# Patient Record
Sex: Male | Born: 1940 | Race: White | Hispanic: No | Marital: Married | Smoking: Former smoker
Health system: Southern US, Community
[De-identification: ages and names within clinical notes are randomized; demographics above are authoritative.]

## PROBLEM LIST (undated history)

## (undated) DIAGNOSIS — F32A Depression, unspecified: Secondary | ICD-10-CM

## (undated) DIAGNOSIS — F329 Major depressive disorder, single episode, unspecified: Secondary | ICD-10-CM

## (undated) DIAGNOSIS — K56609 Unspecified intestinal obstruction, unspecified as to partial versus complete obstruction: Secondary | ICD-10-CM

## (undated) DIAGNOSIS — I495 Sick sinus syndrome: Secondary | ICD-10-CM

## (undated) DIAGNOSIS — C801 Malignant (primary) neoplasm, unspecified: Secondary | ICD-10-CM

## (undated) DIAGNOSIS — M1990 Unspecified osteoarthritis, unspecified site: Secondary | ICD-10-CM

## (undated) HISTORY — PX: TONSILLECTOMY: SUR1361

## (undated) HISTORY — PX: BASAL CELL CARCINOMA EXCISION: SHX1214

## (undated) HISTORY — PX: COLON SURGERY: SHX602

## (undated) HISTORY — DX: Sick sinus syndrome: I49.5

---

## 1998-02-06 ENCOUNTER — Ambulatory Visit (HOSPITAL_BASED_OUTPATIENT_CLINIC_OR_DEPARTMENT_OTHER): Admission: RE | Admit: 1998-02-06 | Discharge: 1998-02-06 | Payer: Self-pay | Admitting: General Surgery

## 2003-08-21 ENCOUNTER — Ambulatory Visit (HOSPITAL_COMMUNITY): Admission: RE | Admit: 2003-08-21 | Discharge: 2003-08-21 | Payer: Self-pay | Admitting: Gastroenterology

## 2003-08-21 ENCOUNTER — Encounter (INDEPENDENT_AMBULATORY_CARE_PROVIDER_SITE_OTHER): Payer: Self-pay | Admitting: Specialist

## 2003-09-12 HISTORY — PX: COLECTOMY: SHX59

## 2003-09-12 HISTORY — PX: APPENDECTOMY: SHX54

## 2003-09-29 ENCOUNTER — Encounter (INDEPENDENT_AMBULATORY_CARE_PROVIDER_SITE_OTHER): Payer: Self-pay | Admitting: Specialist

## 2003-09-29 ENCOUNTER — Inpatient Hospital Stay (HOSPITAL_COMMUNITY): Admission: RE | Admit: 2003-09-29 | Discharge: 2003-10-04 | Payer: Self-pay | Admitting: General Surgery

## 2004-09-07 ENCOUNTER — Ambulatory Visit (HOSPITAL_COMMUNITY): Admission: RE | Admit: 2004-09-07 | Discharge: 2004-09-07 | Payer: Self-pay | Admitting: Gastroenterology

## 2004-09-07 ENCOUNTER — Encounter (INDEPENDENT_AMBULATORY_CARE_PROVIDER_SITE_OTHER): Payer: Self-pay | Admitting: *Deleted

## 2005-01-11 HISTORY — PX: TOTAL KNEE ARTHROPLASTY: SHX125

## 2005-01-31 ENCOUNTER — Inpatient Hospital Stay (HOSPITAL_COMMUNITY): Admission: RE | Admit: 2005-01-31 | Discharge: 2005-02-03 | Payer: Self-pay | Admitting: Orthopedic Surgery

## 2007-10-19 ENCOUNTER — Inpatient Hospital Stay (HOSPITAL_COMMUNITY): Admission: EM | Admit: 2007-10-19 | Discharge: 2007-10-23 | Payer: Self-pay | Admitting: Emergency Medicine

## 2010-10-26 NOTE — H&P (Signed)
[name], [name] NO.:  [id number]   MEDICAL RECORD NO.:  [id number]          PATIENT TYPE:  EMS   LOCATION:  ED                           FACILITY:  [facility]   PHYSICIAN:  George [name], M.D.DATE OF BIRTH:  07-05-1940   DATE OF ADMISSION:  10/19/2007  DATE OF DISCHARGE:                              HISTORY & PHYSICAL   PRIMARY CARE PHYSICIAN:  George [name], M.D.   CHIEF COMPLAINT:  Abdominal pain.   HISTORY OF PRESENT ILLNESS:  The patient is a 70 year old white male  with a past history of hemicolectomy status post colon cancer as well as  BPH, who woke up this morning with severe abdominal pain.  He came into  the emergency room after having several ever episodes of nausea and  vomiting.  By an abdominal series he was found to have signs suspicious  for a partial small-bowel obstruction.  Labs were done.  He was found to  have a white count 12.5 and a mildly elevated BUN and creatinine.  The  patient was started on IV fluids, an NG tube was placed and 500 mL of  fluid was done from suction.  The patient was felt to have a minor small-  bowel obstruction and needed to come in for further evaluation.   Currently he is doing well.  He complains of some mild nausea, some  abdominal pain and discomfort from the NG tube.  Denies any headaches,  vision changes, dysphagia, no chest pain, palpitations, shortness of  breath, wheeze, cough, hematuria, dysuria, constipation, diarrhea, focal  extremity numbness, weakness or pain.  Review of systems otherwise  negative.   PAST MEDICAL HISTORY:  1. BPH.  2. Status post hemicolectomy from colon cancer.   MEDICATIONS:  He is on Celebrex, Cardura and Flomax.   ALLERGIES:  No known drug allergies.   SOCIAL HISTORY:  No tobacco or drug use.  He takes about two beers a  day, no more than this.   FAMILY HISTORY:  Noncontributory.   PHYSICAL EXAM:  VITAL SIGNS ON ADMISSION:  Temperature 97.6, heart rate  78,  blood pressure 132/75, respirations 20, O2 saturation 97% on room  air.  GENERAL:  He is alert and oriented x3, in no apparent distress.  HEENT:  He is normocephalic atraumatic.  His mucous membranes are dry.  He has no carotid bruits.  HEART:  Regular rate and rhythm, S1, S2.  LUNGS:  Clear to auscultation bilaterally.  ABDOMEN:  Soft, distended.  Some generalized tenderness.  No bowel  sounds.  EXTREMITIES:  No clubbing, cyanosis or edema.   LAB WORK:  Abdominal x-ray shows findings suspicious for partial small-  bowel obstruction, cardiomegaly but no acute cardiopulmonary disease.  Labs:  White count 12.5, H&H 15.8 and 46, MCV of 89, platelet count 179,  89% shift.  Sodium 141, potassium 3.9, chloride 102, bicarb 29, BUN 24,  creatinine 1.01, glucose 169.  LFTs were unremarkable.  UA is still  pending.  Lipase 28.   ASSESSMENT AND PLAN:  1. Small bowel obstruction.  N.P.O., pain and nausea medications.  2. History of benign prostatic  hypertrophy.  Holding his p.o.      medications.      George [name], M.D.  Electronically Signed     SKK/MEDQ  D:  10/19/2007  T:  10/19/2007  Job:  [id number]   cc:   George [name], M.D.  Fax: [phone]

## 2010-10-26 NOTE — Discharge Summary (Signed)
NAMESHIRLEY, George [name]           ACCOUNT NO.:  [id number]   MEDICAL RECORD NO.:  [id number]          PATIENT TYPE:  INP   LOCATION:  1616                         FACILITY:  [facility]   PHYSICIAN:  Kela Millin, M.D.DATE OF BIRTH:  [birth year]   DATE OF ADMISSION:  10/19/2007  DATE OF DISCHARGE:  10/23/2007                               DISCHARGE SUMMARY   DISCHARGE DIAGNOSES:  1. Partial small bowel obstruction.  2. 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  3. Hypokalemia - potassium replaced.  4. History of benign prostatic hypertrophy.   STUDIES:  Abdominal x-ray - findings suspicious for a partial small  bowel obstruction.  Cardiomegaly but no acute cardiopulmonary disease.   CONSULTATIONS:  Surgery, Dr. Colin Benton.   BRIEF HISTORY:  The patient is a 70 year old white male with the above-  listed medical problems who presented with complaints of severe  abdominal pain along with nausea and vomiting.  In the ER, abdominal  films were done - suspicious for a partial small bowel obstruction.  He  was admitted for further evaluation and management.  Other lab work  showed a BUN of 21 with a creatinine of 0.95 and urinalysis was negative  for infection.  A lipase was done and it was within normal limits at 28.   Please see the full admission history and physical dictated on Oct 19, 2007 by Dr. Rito Ehrlich for the details of the admission physical exam as  well as the laboratory data.   HOSPITAL COURSE:  1. Partial small bowel obstruction - upon admission an NG tube was      placed for decompression and the patient was started on IV fluids      and kept n.p.o.  By the second hospital day his NG tube output was      still high and so surgery was consulted for further      recommendations.  Dr. Colin Benton saw the patient and agreed with      continuing the NG tube/conservative management.  The patient also  developed fevers while in the hospital and blood cultures were      ordered and he was empirically started on IV antibiotics.      Urinalysis was negative for infection as above.  The patient's      blood cultures have not grown any bacteria and he defervesced.  He      also began having bowel movements and a recheck abdominal x-ray      showed improving small bowel obstruction pattern.  He was then      started on clear liquids and he tolerated this well, and his diet      was advanced as tolerated to a heart-healthy diet, which he has      tolerated well.  Surgery followed up with him on rounds today and      the impression was that his partial small bowel obstruction has      resolved, no further antibiotics recommended given as the patient      is  asymptomatic and workup so far has been negative for an      infectious source.  He will be discharged home today and he is to      follow up with his primary care physician - per Dr. Freida Busman, no      followup with surgery needed.  2. Hypokalemia - his potassium was replaced.  3. A history of benign prostatic hypertrophy - he is to continue his      outpatient medications upon discharge.   DISCHARGE MEDICATIONS:  Patient to continue Cardura, Flomax and Celebrex  as previously.   FOLLOW-UP CARE:  Primary care physician/Dr. Leonides Sake in 1-2 weeks.   DISCHARGE CONDITION:  Improved/stable.      Kela Millin, M.D.  Electronically Signed     ACV/MEDQ  D:  10/23/2007  T:  10/23/2007  Job:  [id number]   cc:   Holley Bouche, M.D.  Fax: [phone]

## 2010-10-26 NOTE — Consult Note (Signed)
[name], George [name] NO.:  [id number]   MEDICAL RECORD NO.:  [id number]          George [name] TYPE:  INP   LOCATION:  1616                         FACILITY:  [facility]   PHYSICIAN:  Alfonse Ras, MD   DATE OF BIRTH:  May 13, 1941   DATE OF CONSULTATION:  10/20/2007  DATE OF DISCHARGE:                                 CONSULTATION   I was asked to see by Dr. Rito Ehrlich.   REASON FOR CONSULTATION:  Partial small bowel obstruction.   HISTORY OF PRESENT ILLNESS:  This is a 70 year old white male with a  history of right colectomy for a high-grade dysplastic polyp of the  right colon in 2005 by Dr. Zachery Dakins.  The George [name] woke yesterday with  acute onset of abdominal pain which was like cramping in nature.  He was  having some nausea and vomiting.  He was seen in the emergency room.  Acute abdominal series was consistent with partial small bowel  obstruction.  He had a white count at that time of 12.5.  He was  admitted, placed on NG tube decompression and films just took early this  morning showed no real change in bowel-gas pattern.  The George [name] has  been passing flatus since early this morning and thus feel significantly  better.   PAST MEDICAL HISTORY:  Significant for BPH and as above.   MEDICATIONS:  Include Celebrex, Cardura, and Flomax.   REVIEW OF SYSTEMS:  Significant as above.   PHYSICAL EXAMINATION:  GENERAL:  He is an age-appropriate white male in  no distress.  VITAL SIGNS:  His temperature is 98.4, blood pressure is 142/78, and  respiratory rate is 16.  HEENT EXAM:  Benign.  Normocephalic and atraumatic.  Pupils are equal,  round, and reactive to light.  LUNGS:  Clear to auscultation and percussion x2.  HEART:  Regular rate and rhythm without murmurs, rubs, or gallops.  ABDOMEN:  Distended with possible bowel sounds and some mild generalized  tenderness but no rebound.  EXTREMITIES:  Show no clubbing, cyanosis, or edema of significance.   IMPRESSION:   Partial small bowel obstruction quite early.   PLAN:  I agree with NG tube decompression and IV fluids.  I will recheck  KUB tomorrow and follow him clinically.      Alfonse Ras, MD  Electronically Signed     KRE/MEDQ  D:  10/20/2007  T:  10/21/2007  Job:  [id number]

## 2010-10-29 NOTE — Discharge Summary (Signed)
NAMESEQUOIA, George [name] NO.:  [id number]   MEDICAL RECORD NO.:  [id number]          PATIENT TYPE:  INP   LOCATION:  1510                         FACILITY:  [facility]   PHYSICIAN:  Ollen Gross, M.D.    DATE OF BIRTH:  08-Sep-1940   DATE OF ADMISSION:  DATE OF DISCHARGE:  02/03/2005                                 DISCHARGE SUMMARY   ADMISSION DIAGNOSES:  1.  Osteoarthritis, left knee.  2.  Hemorrhoids.  3.  History of precancerous colon lesion requiring hemicolectomy.   DISCHARGE DIAGNOSES:  1.  Osteoarthritis, left knee, status post left total knee arthroplasty,      computer-navigation assisted.  2.  Hemorrhoids.  3.  History of precancerous colon lesion requiring hemicolectomy.  4.  Mild postoperative blood loss anemia, but did not require transfusion.   PROCEDURE:  On January 31, 2005, left total knee arthroplasty, computer-  navigation assisted.   SURGEON:  Dr. Lequita Halt.   ASSESSMENT:  George [name], P.A.   ANESTHESIA:  General anesthesia.  Postop Marcaine.  Pan pump.  Tourniquet  time 54 minutes, 300 mmHg.   CONSULTATIONS:  None.   BRIEF HISTORY AND PHYSICAL:  Mr. Monnier is a 70 year old male with end-  stage arthritis of the left knee with intractable pain, and failed  nonoperative management including injections, and now presents for a total  knee.   LABORATORY DATA:  Preop CBC:  Hemoglobin at 15.5, hematocrit of 44.2, white  cell count of 6600.  Differential normal.  Hemoglobin dropped  postoperatively down to a level of 11.9 and back up to 12.3.  PT/PTT, preop,  12.0 and 30.0 respectively, INR 0.9.  PT and INR 26.7 and 2.5.  Chem panel  on admission all within normal limits.  Serial BMETs were followed.  Glucose  went up to 92-123.  Electrolytes remained within normal limits.  Urinalysis,  preop, negative.  Blood type O positive.  EKG dated August 2006:  Normal  sinus rhythm, inferior infarct, age undetermined, unconfirmed.  Two-view  chest on January 24, 2005:  No acute cardiopulmonary abnormalities with some  cardiomegaly noted.   HOSPITAL COURSE:  Admitted to [facility], tolerated the procedure  well, and later sent to a recovery room on the orthopedic floor.  He did  have some pain on the night of surgery, but did a little bit better the next  day.  Encouraged p.o. and PCA meds.  Hemovac drain was pulled.  Got up out  of bed, and went to physical therapy.  By day 2, he did a little bit better.  Pain was under better control.  Fluids and IVs and Foley were discontinued.  Dressing was changed.  Incision was healing well.  He did get up with  therapy, and did very well.  He walked 200 feet twice that day on day 2.  He  did so well, and he was tolerating meds well and then was discharged home on  day 3 on February 03, 2005 after seeing PT.   DISCHARGE PLAN:  Patient discharged home on February 03, 2005.   DISCHARGE DIAGNOSES:  Please see above.  DISCHARGE MEDICATIONS:  Percocet, Robaxin, and Coumadin.   DIET:  As tolerated.   FOLLOW UP:  Follow up 2 weeks from surgery.  Call the office for an  appointment.   ACTIVITY:  Up as tolerated, weightbearing as tolerated.  Home PT, home  health nurse, total knee protocol.  May start showering.   DISPOSITION:  Home.   CONDITION ON DISCHARGE:  Improved.      George [name], P.A.      Ollen Gross, M.D.  Electronically Signed    ALP/MEDQ  D:  02/18/2005  T:  02/18/2005  Job:  [id number]   cc:   Ollen Gross, M.D.  [facility]  [address]  [address]  [place]  [place] [address]  Fax: [phone]   Holley Bouche, M.D.  [address]  [place], [place] [address]  Fax: [phone]

## 2010-10-29 NOTE — Op Note (Signed)
NAMEADHRIT, KRENZ NO.:  [id number]   MEDICAL RECORD NO.:  [id number]                   PATIENT TYPE:  INP   LOCATION:  0442                                 FACILITY:  [facility]   PHYSICIAN:  Anselm Pancoast. Zachery Dakins, M.D.          DATE OF BIRTH:  [birth year]   DATE OF PROCEDURE:  09/29/2003  DATE OF DISCHARGE:                                 OPERATIVE REPORT   PREOPERATIVE DIAGNOSES:  Large tubovillous adenoma of cecum.   POSTOPERATIVE DIAGNOSES:  Large tubovillous adenoma of cecum.   PATHOLOGY:  Final path waiting.   OPERATION:  Right colectomy and incidental appendectomy.   ANESTHESIA:  General.   SURGEON:  Anselm Pancoast. Zachery Dakins, M.D.   ASSISTANT:  Sheppard Plumber. Earlene Plater, M.D.   HISTORY:  George [name] is a 70 year old Caucasian male who recently on  physical exam by Dr. Tiburcio Pea was noted to have a guaiac positive stool.  He  is on Celebrex for arthritis type problems.  He then was referred for a  colonoscopy and this was performed by Dr. Graylin Shiver, M.D. with the  findings of a small polyp within the transverse colon and a large broad  based polypoid lesion in the cecum about 3 cm.  Biopsies of this showed a  tubovillous adenoma.  This was larger enough that it could not be removed by  colonoscopy and was referred to me for surgery.  I discussed with the  patient that hopefully this was going to be benign but because of its size  would go ahead and proceed with a right colectomy and he is in agreement.  We did a GoLYTELY erythromycin neomycin bowel prep for surgery.  The prep  was tolerated well and the patient preoperatively was given 3 g of Unasyn  with his IV and PAS stockings  and proceeded on with the surgery. The  patient has a history of multiple lipomas and I elected to do transverse  incision.   Induction of general anesthesia, endotracheal tube, the abdomen was prepped  with Betadine surgical scrub and solution and draped in a sterile  manner. A  right transverse incision was made just above the umbilicus and the right  rectus divided with cautery; the cervical vessel requiring coagulation and  clamp, and then the posterior rectus fascia and peritoneum were picked up  with hemostats and we opened into the peritoneal cavity.  The cecum was  right under the incision.  You could not actually feel the obvious lesion  but he has got a lot of lipomas throughout the mesentery and also in the  omentum and these sort of make the palpation of the colon itself a little  more difficult.  We elected to go ahead and remove probably 6 inches of  terminal ileum and the right colon.  The hepatic flexure was fairly low and  we mobilized the hepatic flexure laterally and then removed a small portion  of the omentum  from the area so we could attach it to the right of midline  and did kind of a limited right colectomy.  The mesenteric vessels were  divided between [facility] and these were ligated with 2-0 silk. The major  vessels were sutured with 2-0 silk free ties, and then the anastomosis to  the appendix was pinned.  The appendix was removed along with the specimen  and then the anastomosis was performed with TA-60 and GMA-55 in functional  side to side with distal portion transected with the TA-60.  In the  anastomosis, we put three finger lumens without tension and a little crotch  stitch of 3-0 silk was placed. The mesenteric defect was then closed with  interrupted figure-of-eight sutures of 3-0 silk and the anastomosis was  lying comfortable. The omentum was placed over it and the small bowel was in  normal position and sponge count was correct. Estimated blood loss was  probably about 100 mL and the abdominal incision was closed with two layers  of #0 PDS in the inner layer and then #0 PDS in the anterior fascial layer.  The skin was closed with staples after washing the fascial layer.  The  patient's preoperative CEA is 1, and  hopefully this will be still a  tubovillous adenoma.  We sent it for gross examination and will await the  final pathology report.                                               Anselm Pancoast. Zachery Dakins, M.D.    WJW/MEDQ  D:  09/29/2003  T:  09/30/2003  Job:  [id number]   cc:   Dr. __________   Holley Bouche, M.D.  [address]  [place], [place] [address]  Fax: [phone]

## 2010-10-29 NOTE — Discharge Summary (Signed)
NAMEJARIOUS, LYON NO.:  [id number]   MEDICAL RECORD NO.:  [id number]                   PATIENT TYPE:  INP   LOCATION:  0442                                 FACILITY:  [facility]   PHYSICIAN:  Anselm Pancoast. Zachery Dakins, M.D.          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   OPERATION:  Right colectomy.   HISTORY:  George [name] is a 70 year old Caucasian male who was referred  to me by [facility] after he had undergone a colonoscopy because  of guaiac positive stool by Dr. Evette Cristal, with the finding of a large  tubovillous adenoma in the right colon, and also a couple of smaller polyps  that had been removed.  The biopsy did not show carcinoma, and the patient  was taking Celebrex for arthritis, predominately knees, which he has been on  chronically.  I recommended that we proceed on with a right colectomy, and  he underwent a GoLYTELY and erythromycin bowel prep in preparation for  surgery, and was admitted on the morning of surgery, September 29, 2003.   The patient was taken to surgery.  Dr. Earlene Plater assisted.  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   The patient did nicely.  He underwent liquids on about the second to third  postoperative day.  This was gradually advanced.  He has gotten good bowel  function, and is having healing of his transverse incision.  The staples  have been removed, and the wound steri-stripped.   DISCHARGE MEDICATIONS:  The patient is discharged to  resume his regular  medications.  He can resume the Celebrex and his blood pressure tablet.   FOLLOW UP:  He will return to see me in the office in approximately 10 days.   DISCHARGE INSTRUCTIONS:  He understands not to do any strenuous lifting for  approximately 3 weeks.                                               Anselm Pancoast. Zachery Dakins, M.D.    WJW/MEDQ  D:  10/04/2003  T:  10/04/2003  Job:  [id number]   cc:   Holley Bouche, M.D.  [address]  [place], [place] [address]  Fax: [phone]   Graylin Shiver, M.D.  [address]  [address]  [place], [place] [address]  Fax: [phone]

## 2010-10-29 NOTE — Op Note (Signed)
NAMEJANTZ, MAIN NO.:  [id number]   MEDICAL RECORD NO.:  [id number]          PATIENT TYPE:  INP   LOCATION:  0004                         FACILITY:  [facility]   PHYSICIAN:  Ollen Gross, M.D.    DATE OF BIRTH:  [birth year]   DATE OF PROCEDURE:  01/31/2005  DATE OF DISCHARGE:                                 OPERATIVE REPORT   PREOPERATIVE DIAGNOSIS:  Osteoarthritis left knee.   POSTOPERATIVE DIAGNOSIS:  Osteoarthritis left knee.   PROCEDURE:  Left total knee arthroplasty computer navigated.   SURGEON:  Ollen Gross, M.D.   ASSISTANT:  Avel Peace, P.A.-C.   ANESTHESIA:  General with postop Marcaine pain pump.   ESTIMATED BLOOD LOSS:  Minimal.   DRAINS:  Hemovac x1.   TOURNIQUET TIME:  54 minutes at 300 mmHg.   COMPLICATIONS:  None.   CONDITION:  Stable to recovery.   BRIEF CLINICAL NOTE:  [name] is a 70 year old male with end-stage  osteoarthritis of the left knee with intractable pain. He has failed  nonoperative management including injection and presents now for total knee  arthroplasty.   PROCEDURE IN DETAIL:  After successful administration of general anesthetic,  a tourniquet was placed high on the left thigh and left lower extremity  prepped and draped in the usual sterile fashion. Extremity  was wrapped in  Esmarch, knee flexed and tourniquet inflated to 300 mmHg. A standard midline  incision was made with 10 blade through the subcutaneous tissue to the level  of the extensor mechanism. A fresh blade is used to make a medial  parapatellar arthrotomy then soft tissue over the proximal medial tibia is  subperiosteally elevated to the joint line with a knife and into the  semimembranosus bursa with a Cobb elevator. The soft tissue over the  proximal lateral tibia is also elevated attention being paid to avoiding the  patellar tendon on tibial tubercle. The patella is everted, knee flexed 90  degrees, ACL and PCL removed. The Schanz  screws, 4 mm each, are placed two  into the tibia and two into the femur for placement of the computerized  arrays. The anatomic data is then input into the computer for generation of  the femoral and tibial models. Preop alignment is 8 degrees varus, 12 degree  flexion contracture.   A distal femoral cutting block is placed and this is a very neutral varus-  valgus cut and neutral flexion/extension to remove 11 mm off the distal  femur given his flexion contracture. The block is pinned and the distal  femoral resection made with an oscillating saw. The verification device is  placed and it confirms that the cut is made as planned. The rotation is  marked at the epicondylar axis. The computer-generated a size four and we  checked the intraoperative measurement which confirmed the size four. The  size four cutting block is then placed under computer guidance and the  anterior, posterior and chamfer cuts were made. The verification device  confirms that we made them as planned.   The tibia was subluxed forward and the menisci removed. Extramedullary  tibial alignment guide  was placed under computer guidance. We planned on  taking 10 mm off the non deficient lateral side which got [place] in the bottom  of the medial defect. The cut was made in neutral varus-valgus and  approximately 2 degrees of flexion. The block is pinned and resection made  with an oscillating saw. The verification device confirms that the cut was  made as planned. The size four is the most appropriate tibial component and  the proximal tibia was prepared with the modular drill and keel punch for  size four. Femoral preparation is completed with the intercondylar cut for  the size four.   A size four mobile bearing tibial trial with a size four posterior  stabilized femoral trial and a 10 mm posterior stabilized rotating platform  insert trial are placed. With the 10, full extension is achieved with  excellent varus and  valgus balance throughout full range of motion. The  computer confirmed that we were within 2 degrees of full extension and had  corrected the varus deformity back to completely neutral. The patella is  then everted and thickness measured to be 26 mm. Freehand resection is taken  to approximately 14 mm. A 41 template is placed, lug holes were drilled,  trial patella was placed and it tracks normally. The osteophytes were then  removed off the posterior femur with the femoral trial in place. All trials  are removed and the cut bone surfaces are prepared with pulsatile lavage.  Cement is mixed and once ready for implantation, a size four mobile bearing  tibial tray, size four posterior stabilized femur and 41 patella are  cemented into place and patella was held the clamp. Trial 10 mm insert is  placed, knee held in full extension, all extruded cement removed. Pins for  the computer were removed. Once the cement was fully hardened then the  permanent 10 mm posterior stabilized rotating platform insert is placed into  the tibial tray. The wound was copiously irrigated with saline solution and  the extensor mechanism closed over a Hemovac drain with interrupted #1 PDS.  Flexion against gravity is about 130 degrees. The subcu is closed with  interrupted 2-0 Vicryl. The tourniquet was released with a total time of 54  minutes. The catheter for the Marcaine pain pump was placed and the pump  initiated. The subcuticular level was then closed with a running 4-0  Monocryl. Incision is cleaned and dried and Steri-Strips and a bulky sterile  dressing applied. A Hemovac is hooked to suction. He was then placed into a  knee immobilizer, awakened and transported to recovery in stable condition.      Ollen Gross, M.D.  Electronically Signed     FA/MEDQ  D:  01/31/2005  T:  01/31/2005  Job:  [id number]

## 2010-10-29 NOTE — Op Note (Signed)
NAME:  George [name], George [name] NO.:  [id number]   MEDICAL RECORD NO.:  [id number]          PATIENT TYPE:  AMB   LOCATION:  ENDO                         [facility]ACILITY:  [facility]   PHYSICIAN:  Graylin Shiver, M.D.   DATE OF BIRTH:  01/08/1941   DATE OF PROCEDURE:  09/07/2004  DATE OF DISCHARGE:                                 OPERATIVE REPORT   PROCEDURE:  Colonoscopy with biopsy.   ENDOSCOPIST:  Graylin Shiver, M.D.   INDICATIONS FOR PROCEDURE:  This patient is a 70 year old male with a  history of colon polyps.  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  The patient is here today for a followup colonoscopy.   Informed consent was obtained after explanation of the risks of bleeding,  infection and perforation.   PREMEDICATION:  Fentanyl 50 mcg IV, Versed 6 mg IV.   PROCEDURE:  With the patient in the left lateral decubitus position, a  rectal exam was performed; no masses were felt.  The Olympus colonoscope was  inserted into the rectum and advanced around the colon to the anastomosis  site.  The anastomosis site looked normal.  The scope was brought out  visualizing the rest of the colon.  The rest of the colon on the transverse  and descending colon looked unremarkable.  In the distal sigmoid and rectum,  there were 3 tiny polyps that were biopsied off and placed in the same  specimen container.  There was 1 tiny polyp in the sigmoid and 2 in the  rectum, as mentioned above; these all looked benign and were biopsied off  with cold forceps.  He tolerated the procedure well without complications.   IMPRESSION:  Small colon polyps in the rectum and sigmoid.   PLAN:  The pathology will be checked.   I will recommend a followup colonoscopy again in 3 years.      SFG/MEDQ  D:  09/07/2004  T:  09/07/2004  Job:  [id number]   cc:   Holley Bouche, M.D.  [address]  [place], [place] [address]  Fax: [phone]   Anselm Pancoast. Zachery Dakins, M.D.  [address], [address]  [place]  [place] [address]

## 2010-10-29 NOTE — H&P (Signed)
George [name], STENCEL NO.:  [id number]   MEDICAL RECORD NO.:  [id number]          PATIENT TYPE:  INP   LOCATION:  NA                           FACILITY:  [facility]   PHYSICIAN:  Ollen Gross, M.D.    DATE OF BIRTH:  [birth year]   DATE OF ADMISSION:  01/31/2005  DATE OF DISCHARGE:                                HISTORY & PHYSICAL   CHIEF COMPLAINT:  Left knee pain.   HISTORY OF PRESENT ILLNESS:  The patient is a 70 year old male seen by Dr.  Ollen Gross for a long progressive history of left knee pain. He has been  seen by Dr. Homero Fellers Aluisio on a second opinion for his left knee pain. He is  found to have bone-on-bone arthritis. He has been treated in the past but  conservatively but has had intractable pain. X-rays show bone-on-bone medial  compartment with significant patellar femoral spurring. He is quite active  and his arthritis is holding back from his active lifestyle. It is felt he  would benefit from undergoing knee replacement. Risks and benefits  discussed. The patient is subsequently admitted to the hospital.   ALLERGIES:  No known drug allergies.   CURRENT MEDICATIONS:  1.  Celebrex 200 mg daily.  2.  Multivitamin daily.  3.  Glucosamine chondroitin.  4.  Sal palmetto.   PAST MEDICAL HISTORY:  1.  Hemorrhoids.  2.  Osteoarthritis.   PAST SURGICAL HISTORY:  1.  Left knee arthroscopy x2 in October of 1998 and October of 2002.  2.  A right hemicolectomy for precancerous lesion in April 2005.  3.  He also gets his yearly colonoscopy.   SOCIAL HISTORY:  Married, retired, nonsmoker, one to two beers a day. Two  children. Wife will be assisting with care after surgery.   FAMILY HISTORY:  Father deceased at age 30 with history of cancer. Mother  deceased at age 59 with history of diabetes and also rheumatoid arthritis.   REVIEW OF SYSTEMS:  GENERAL:  No fevers, chills, or night sweats.  NEUROLOGICAL:  No seizures, syncope, or paralysis.  RESPIRATORY:  No  shortness of breath, productive cough, or hemoptysis. CARDIOVASCULAR:  No  chest pain, angina, or orthopnea. GI:  No nausea, vomiting, diarrhea,  constipation. GU:  No dysuria, hematuria, or discharge. MUSCULOSKELETAL:  Left knee as found in the history of present illness.   PHYSICAL EXAMINATION:  VITAL SIGNS:  Pulse 72, respirations 16, blood  pressure 122/78.  GENERAL:  A 70 year old white male well-developed, well-nourished in no  acute distress. Alert, oriented, and cooperative.  HEENT:  Normocephalic and atraumatic. Pupil round and reactive. Oropharynx  clear. EOMs intact. Neck is supple.  CHEST:  Clear.  HEART:  Regular rate and rhythm.  ABDOMEN:  Soft. Slightly round. Bowel sounds present.  RECTAL:  Not done, not pertinent to present illness.  BREASTS:  Not done, not pertinent to present illness.  GENITALIA:  Not done, not pertinent to present illness.  EXTREMITIES:  Left knee shows a varus malalignment deformity with a range of  motion of 0 to 125, passive range of motion, moderate crepitus is noted.  No  instability.   IMPRESSION:  Osteoarthritis left knee.   PLAN:  The patient will admitted to [facility] to undergo a left  total knee replacement arthroplasty. Surgery will be performed by Dr. Ollen Gross.      Alexzandrew L. Julien Girt, P.A.      Ollen Gross, M.D.  Electronically Signed    ALP/MEDQ  D:  01/30/2005  T:  01/31/2005  Job:  [id number]   cc:   Ollen Gross, M.D.  [facility]  [address]  [address]  [place]  [place] [address]  Fax: [phone]   Holley Bouche, M.D.  [address]  [place], [place] [address]  Fax: [phone]

## 2010-10-29 NOTE — Op Note (Signed)
NAME:  DAVARIS, YOUTSEY NO.:  [id number]   MEDICAL RECORD NO.:  [id number]                   PATIENT TYPE:  AMB   LOCATION:  ENDO                                 FACILITY:  [facility]   PHYSICIAN:  Graylin Shiver, M.D.                DATE OF BIRTH:  [birth year]   DATE OF PROCEDURE:  08/21/2003  DATE OF DISCHARGE:                                 OPERATIVE REPORT   PROCEDURE:  Colonoscopy with biopsy and polypectomy.   INDICATIONS:  Heme-positive stool.   Informed consent was obtained after explanation of the risks of bleeding,  infection, and perforation.   PREMEDICATION:  Fentanyl 75 mcg IV, Versed 8 mg IV.   PROCEDURE:  With the patient in the left lateral decubitus position, a  rectal exam was performed, and no masses were felt.  The Olympus colonoscope  was inserted into the rectum and advanced around the colon to the cecum.  Cecal landmarks were identified.  At the base of the cecum, there was a  large, broad-based, sessile, multilobulated polypoid mass/lesion.  This  started right off the appendiceal orifice and extended across the portion of  the base of the cecum.  Multiple biopsies of this were obtained.  Due to its  broad nature, I saw no way to safely snare and remove this lesion.  The  ascending colon looked normal.  In the transverse colon, there was a small 3  mm polyp biopsied off with cold forceps.  The descending colon and the  sigmoid looked normal.  In the rectum, there were three small sessile  polyps.  One was approximately 8 or 9 mm in size.  The other polyps were 3-5  mm in size.  These were all snared off using a mini snare and snare cautery  technique.  All cautery sites looked good, and the polyps were retrieved.  Some small hemorrhoids were noted as the scope was brought out.  He  tolerated the procedure well without complications.   IMPRESSION:  1. Large broad-based polypoid lesion at the base of the cecum.  This was 2.5     to  3 cm in size.  Biopsies were obtained.  I did not feel that it could     be safely snared and removed.  2. Small polyp in the transverse colon.  3. Three small polyps in the rectum, which were snared and removed.   PLAN:  The pathology will be checked.  I will make a surgical referral for  this large polypoid lesion of the cecum.                                               Graylin Shiver, M.D.    Germain Osgood  D:  08/21/2003  T:  08/21/2003  Job:  [id number]   cc:   Melida Quitter, M.D.  [address]  [place]  [place] [address]  Fax: [phone]

## 2010-12-02 ENCOUNTER — Other Ambulatory Visit: Payer: Self-pay | Admitting: Gastroenterology

## 2012-01-13 DIAGNOSIS — N401 Enlarged prostate with lower urinary tract symptoms: Secondary | ICD-10-CM | POA: Diagnosis not present

## 2012-01-19 DIAGNOSIS — N486 Induration penis plastica: Secondary | ICD-10-CM | POA: Diagnosis not present

## 2012-01-19 DIAGNOSIS — N401 Enlarged prostate with lower urinary tract symptoms: Secondary | ICD-10-CM | POA: Diagnosis not present

## 2012-01-19 DIAGNOSIS — E291 Testicular hypofunction: Secondary | ICD-10-CM | POA: Diagnosis not present

## 2012-01-19 DIAGNOSIS — N529 Male erectile dysfunction, unspecified: Secondary | ICD-10-CM | POA: Diagnosis not present

## 2012-01-27 DIAGNOSIS — E291 Testicular hypofunction: Secondary | ICD-10-CM | POA: Diagnosis not present

## 2012-01-27 DIAGNOSIS — R972 Elevated prostate specific antigen [PSA]: Secondary | ICD-10-CM | POA: Diagnosis not present

## 2012-01-31 DIAGNOSIS — E291 Testicular hypofunction: Secondary | ICD-10-CM | POA: Diagnosis not present

## 2012-01-31 DIAGNOSIS — R7309 Other abnormal glucose: Secondary | ICD-10-CM | POA: Diagnosis not present

## 2012-01-31 DIAGNOSIS — F329 Major depressive disorder, single episode, unspecified: Secondary | ICD-10-CM | POA: Diagnosis not present

## 2012-02-02 DIAGNOSIS — L821 Other seborrheic keratosis: Secondary | ICD-10-CM | POA: Diagnosis not present

## 2012-02-02 DIAGNOSIS — D239 Other benign neoplasm of skin, unspecified: Secondary | ICD-10-CM | POA: Diagnosis not present

## 2012-04-05 DIAGNOSIS — Z23 Encounter for immunization: Secondary | ICD-10-CM | POA: Diagnosis not present

## 2012-04-18 DIAGNOSIS — Z0000 Encounter for general adult medical examination without abnormal findings: Secondary | ICD-10-CM | POA: Diagnosis not present

## 2012-04-18 DIAGNOSIS — R972 Elevated prostate specific antigen [PSA]: Secondary | ICD-10-CM | POA: Diagnosis not present

## 2012-04-25 DIAGNOSIS — N486 Induration penis plastica: Secondary | ICD-10-CM | POA: Diagnosis not present

## 2012-04-25 DIAGNOSIS — E291 Testicular hypofunction: Secondary | ICD-10-CM | POA: Diagnosis not present

## 2012-04-25 DIAGNOSIS — R972 Elevated prostate specific antigen [PSA]: Secondary | ICD-10-CM | POA: Diagnosis not present

## 2012-04-25 DIAGNOSIS — N402 Nodular prostate without lower urinary tract symptoms: Secondary | ICD-10-CM | POA: Diagnosis not present

## 2012-05-04 DIAGNOSIS — J40 Bronchitis, not specified as acute or chronic: Secondary | ICD-10-CM | POA: Diagnosis not present

## 2012-07-23 DIAGNOSIS — R972 Elevated prostate specific antigen [PSA]: Secondary | ICD-10-CM | POA: Diagnosis not present

## 2012-07-23 DIAGNOSIS — E291 Testicular hypofunction: Secondary | ICD-10-CM | POA: Diagnosis not present

## 2012-07-30 DIAGNOSIS — E291 Testicular hypofunction: Secondary | ICD-10-CM | POA: Diagnosis not present

## 2012-07-30 DIAGNOSIS — N529 Male erectile dysfunction, unspecified: Secondary | ICD-10-CM | POA: Diagnosis not present

## 2012-07-30 DIAGNOSIS — N401 Enlarged prostate with lower urinary tract symptoms: Secondary | ICD-10-CM | POA: Diagnosis not present

## 2012-08-23 DIAGNOSIS — M1990 Unspecified osteoarthritis, unspecified site: Secondary | ICD-10-CM | POA: Diagnosis not present

## 2012-09-27 DIAGNOSIS — R972 Elevated prostate specific antigen [PSA]: Secondary | ICD-10-CM | POA: Diagnosis not present

## 2012-09-27 DIAGNOSIS — IMO0002 Reserved for concepts with insufficient information to code with codable children: Secondary | ICD-10-CM | POA: Diagnosis not present

## 2012-09-27 DIAGNOSIS — D075 Carcinoma in situ of prostate: Secondary | ICD-10-CM | POA: Diagnosis not present

## 2012-09-27 DIAGNOSIS — N411 Chronic prostatitis: Secondary | ICD-10-CM | POA: Diagnosis not present

## 2012-10-29 DIAGNOSIS — M549 Dorsalgia, unspecified: Secondary | ICD-10-CM | POA: Diagnosis not present

## 2013-02-20 DIAGNOSIS — M999 Biomechanical lesion, unspecified: Secondary | ICD-10-CM | POA: Diagnosis not present

## 2013-02-20 DIAGNOSIS — M5380 Other specified dorsopathies, site unspecified: Secondary | ICD-10-CM | POA: Diagnosis not present

## 2013-02-20 DIAGNOSIS — IMO0002 Reserved for concepts with insufficient information to code with codable children: Secondary | ICD-10-CM | POA: Diagnosis not present

## 2013-02-20 DIAGNOSIS — M545 Low back pain: Secondary | ICD-10-CM | POA: Diagnosis not present

## 2013-02-22 DIAGNOSIS — M999 Biomechanical lesion, unspecified: Secondary | ICD-10-CM | POA: Diagnosis not present

## 2013-02-22 DIAGNOSIS — M5380 Other specified dorsopathies, site unspecified: Secondary | ICD-10-CM | POA: Diagnosis not present

## 2013-02-22 DIAGNOSIS — IMO0002 Reserved for concepts with insufficient information to code with codable children: Secondary | ICD-10-CM | POA: Diagnosis not present

## 2013-02-22 DIAGNOSIS — M545 Low back pain: Secondary | ICD-10-CM | POA: Diagnosis not present

## 2013-02-25 DIAGNOSIS — M545 Low back pain: Secondary | ICD-10-CM | POA: Diagnosis not present

## 2013-02-25 DIAGNOSIS — M5380 Other specified dorsopathies, site unspecified: Secondary | ICD-10-CM | POA: Diagnosis not present

## 2013-02-25 DIAGNOSIS — M999 Biomechanical lesion, unspecified: Secondary | ICD-10-CM | POA: Diagnosis not present

## 2013-02-25 DIAGNOSIS — Z23 Encounter for immunization: Secondary | ICD-10-CM | POA: Diagnosis not present

## 2013-02-25 DIAGNOSIS — IMO0002 Reserved for concepts with insufficient information to code with codable children: Secondary | ICD-10-CM | POA: Diagnosis not present

## 2013-03-22 DIAGNOSIS — IMO0002 Reserved for concepts with insufficient information to code with codable children: Secondary | ICD-10-CM | POA: Diagnosis not present

## 2013-03-22 DIAGNOSIS — M1710 Unilateral primary osteoarthritis, unspecified knee: Secondary | ICD-10-CM | POA: Diagnosis not present

## 2013-04-12 DIAGNOSIS — R972 Elevated prostate specific antigen [PSA]: Secondary | ICD-10-CM | POA: Diagnosis not present

## 2013-04-29 DIAGNOSIS — N139 Obstructive and reflux uropathy, unspecified: Secondary | ICD-10-CM | POA: Diagnosis not present

## 2013-04-29 DIAGNOSIS — R972 Elevated prostate specific antigen [PSA]: Secondary | ICD-10-CM | POA: Diagnosis not present

## 2013-04-29 DIAGNOSIS — N401 Enlarged prostate with lower urinary tract symptoms: Secondary | ICD-10-CM | POA: Diagnosis not present

## 2013-04-29 DIAGNOSIS — E291 Testicular hypofunction: Secondary | ICD-10-CM | POA: Diagnosis not present

## 2013-05-07 DIAGNOSIS — M1710 Unilateral primary osteoarthritis, unspecified knee: Secondary | ICD-10-CM | POA: Diagnosis not present

## 2013-07-11 DIAGNOSIS — R300 Dysuria: Secondary | ICD-10-CM | POA: Diagnosis not present

## 2013-07-19 DIAGNOSIS — R0602 Shortness of breath: Secondary | ICD-10-CM | POA: Diagnosis not present

## 2013-07-19 DIAGNOSIS — R42 Dizziness and giddiness: Secondary | ICD-10-CM | POA: Diagnosis not present

## 2013-08-13 DIAGNOSIS — J40 Bronchitis, not specified as acute or chronic: Secondary | ICD-10-CM | POA: Diagnosis not present

## 2013-08-13 DIAGNOSIS — J111 Influenza due to unidentified influenza virus with other respiratory manifestations: Secondary | ICD-10-CM | POA: Diagnosis not present

## 2013-08-13 DIAGNOSIS — R42 Dizziness and giddiness: Secondary | ICD-10-CM | POA: Diagnosis not present

## 2013-08-19 DIAGNOSIS — R599 Enlarged lymph nodes, unspecified: Secondary | ICD-10-CM | POA: Diagnosis not present

## 2013-08-19 DIAGNOSIS — B9789 Other viral agents as the cause of diseases classified elsewhere: Secondary | ICD-10-CM | POA: Diagnosis not present

## 2013-10-24 DIAGNOSIS — C44319 Basal cell carcinoma of skin of other parts of face: Secondary | ICD-10-CM | POA: Diagnosis not present

## 2013-10-24 DIAGNOSIS — D485 Neoplasm of uncertain behavior of skin: Secondary | ICD-10-CM | POA: Diagnosis not present

## 2013-10-24 DIAGNOSIS — L821 Other seborrheic keratosis: Secondary | ICD-10-CM | POA: Diagnosis not present

## 2013-10-24 DIAGNOSIS — I781 Nevus, non-neoplastic: Secondary | ICD-10-CM | POA: Diagnosis not present

## 2013-10-30 DIAGNOSIS — M1710 Unilateral primary osteoarthritis, unspecified knee: Secondary | ICD-10-CM | POA: Diagnosis not present

## 2013-11-25 DIAGNOSIS — C44319 Basal cell carcinoma of skin of other parts of face: Secondary | ICD-10-CM | POA: Diagnosis not present

## 2013-11-28 DIAGNOSIS — M1710 Unilateral primary osteoarthritis, unspecified knee: Secondary | ICD-10-CM | POA: Diagnosis not present

## 2013-11-28 DIAGNOSIS — Z96659 Presence of unspecified artificial knee joint: Secondary | ICD-10-CM | POA: Diagnosis not present

## 2013-12-16 DIAGNOSIS — F3289 Other specified depressive episodes: Secondary | ICD-10-CM | POA: Diagnosis not present

## 2013-12-16 DIAGNOSIS — M1990 Unspecified osteoarthritis, unspecified site: Secondary | ICD-10-CM | POA: Diagnosis not present

## 2013-12-16 DIAGNOSIS — F329 Major depressive disorder, single episode, unspecified: Secondary | ICD-10-CM | POA: Diagnosis not present

## 2013-12-25 DIAGNOSIS — R972 Elevated prostate specific antigen [PSA]: Secondary | ICD-10-CM | POA: Diagnosis not present

## 2013-12-25 DIAGNOSIS — E291 Testicular hypofunction: Secondary | ICD-10-CM | POA: Diagnosis not present

## 2013-12-31 DIAGNOSIS — N401 Enlarged prostate with lower urinary tract symptoms: Secondary | ICD-10-CM | POA: Diagnosis not present

## 2013-12-31 DIAGNOSIS — E291 Testicular hypofunction: Secondary | ICD-10-CM | POA: Diagnosis not present

## 2013-12-31 DIAGNOSIS — N138 Other obstructive and reflux uropathy: Secondary | ICD-10-CM | POA: Diagnosis not present

## 2014-01-21 DIAGNOSIS — M25569 Pain in unspecified knee: Secondary | ICD-10-CM | POA: Diagnosis not present

## 2014-02-11 ENCOUNTER — Other Ambulatory Visit: Payer: Self-pay | Admitting: Orthopedic Surgery

## 2014-02-11 DIAGNOSIS — M545 Low back pain, unspecified: Secondary | ICD-10-CM | POA: Diagnosis not present

## 2014-02-11 DIAGNOSIS — M999 Biomechanical lesion, unspecified: Secondary | ICD-10-CM | POA: Diagnosis not present

## 2014-02-11 NOTE — H&P (Signed)
George [name] DOB: March 03, 1941 Widowed / Language: English / Race: White Male Date of Admission: 03/03/2014 Chief Complaint:  Right Knee Pain History of Present Illness The patient is a 73 year old male who comes in for a preoperative history and physical. The patient is scheduled for a right total knee arthroplasty to be performed by Dr. Dione Plover. Aluisio, MD at [facility] on 03/03/2014. The patient is a 73 year old male who presents for follow up of their knee. The patient is being followed for their right knee pain and osteoarthritis. They are now sevveral month(s) out from a cortisone injection. Symptoms reported today include: pain, aching, stiffness and instability (especially with stairs). The patient feels that they are doing poorly and report their pain level to be mild to moderate. Current treatment includes: NSAIDs (Celebrex; Rx from Dr. Kenton Kingfisher, his pcp). The patient has not gotten any relief of their symptoms with Cortisone injections. Note for "Follow-up Knee": He is using the bike at the gym. He states the knee feels good while he is on the bike, but stiffens back up as soon as he gets off. George [name] came in following his cortisone injection into the right knee. This was his second injection. The first shot did very well but this most recent shot only helped for about two weeks and then wore off. He has tried the gel series in the left knee prior to having the left total knee replacement done and got no benefit from the series. He is not interested in going thru the series again in the right knee. He is ready to proceed with surgery at this time. They have been treated conservatively in the past for the above stated problem and despite conservative measures, they continue to have progressive pain and severe functional limitations and dysfunction. They have failed non-operative management including home exercise, medications, and injections. It is felt that they would  benefit from undergoing total joint replacement. Risks and benefits of the procedure have been discussed with the patient and they elect to proceed with surgery. There are no active contraindications to surgery such as ongoing infection or rapidly progressive neurological disease.  Allergies No Known Drug Allergies  Problem List/Past Medical  S/P total knee replacement (V43.65  Z96.659) Primary localized osteoarthritis of right knee (715.16  M17.11) Anxiety Disorder Depression Osteoarthritis Seizure Disorder one episode - 1982 Impaired Vision wears glasses Tinnitus Vertigo rare occasion Hiatal Hernia Hemorrhoids some recent bleeding Skin Cancer Basal Cell - Nose Area  Family History Congestive Heart Failure Mother. mother Depression mother Rheumatoid Arthritis mother Diabetes Mellitus mother Heart disease in male family member before age 54 Hypertension First Degree Relatives. 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 former smoker; smoke(d) 1 1/2 pack(s) per day  Medication History  CeleBREX (200MG  Capsule, Oral) Active. Wellbutrin (Oral) Specific dose unknown - Active. Doxazosin Mesylate (Oral) Specific dose unknown - Active. Finasteride (Oral) Specific dose unknown - Active. Multivitamins (Oral) Active. Aspirin (81MG  Tablet, Oral daily) Active. Citalopram Hydrobromide (20MG  Tablet, Oral) Active.  Past Surgical History  Arthroscopy of Knee left Colectomy partial - precancerous area Colon Polyp Removal - Open Total Knee Replacement left  Review of Systems General  Present- Fatigue. Not Present- Chills, Fever, Memory Loss, Night Sweats, Weight Gain and Weight Loss. Skin Not Present- Eczema, Hives, Itching, Lesions and Rash. HEENT Present- Tinnitus. Not Present- Dentures, Double Vision, Headache, Hearing Loss and Visual Loss. Respiratory Not Present- Allergies, Chronic Cough, Coughing up blood, Shortness of breath at rest and Shortness of breath with exertion. Cardiovascular Not Present- Chest Pain, Difficulty Breathing Lying Down, Murmur, Palpitations, Racing/skipping heartbeats and Swelling. Gastrointestinal Not Present- Abdominal Pain, Bloody Stool, Constipation, Diarrhea, Difficulty Swallowing, Heartburn, Jaundice, Loss of appetitie, Nausea and Vomiting. Male Genitourinary Present- Urinary frequency, Urinating at Night and Weak urinary stream. Not Present- Blood in Urine, Discharge, Flank Pain, Incontinence, Painful Urination, Urgency and Urinary Retention. Musculoskeletal Present- Back Pain, Joint Pain and Morning Stiffness. Not Present- Joint Swelling, Muscle Pain, Muscle Weakness and Spasms. Neurological Present- Tremor. Not Present- Blackout spells, Difficulty with balance, Dizziness, Paralysis and Weakness. Psychiatric Not Present- Insomnia.  Vitals  Weight: 175 lb Height: 69in Weight was reported by patient. Height was reported by patient. Body Surface Area: 1.97 m Body Mass Index: 25.84 kg/m BP: 118/64 (Sitting, Right Arm, Standard)  Physical Exam  General Mental Status -Alert, cooperative and good historian. General Appearance-pleasant, Not in acute distress. Orientation-Oriented X3. Build & Nutrition-Well nourished and Well developed.  Head and Neck Head-normocephalic, atraumatic . Neck Global Assessment - supple, no bruit auscultated on the right, no bruit auscultated on the left.  Eye Pupil - Bilateral-Regular and Round. Motion - Bilateral-EOMI.  Chest and Lung Exam Auscultation Breath sounds - clear at  anterior chest wall and clear at posterior chest wall. Adventitious sounds - No Adventitious sounds.  Cardiovascular Auscultation Rhythm - Regular rate and rhythm. Heart Sounds - S1 WNL and S2 WNL. Murmurs & Other Heart Sounds - Auscultation of the heart reveals - No Murmurs.  Abdomen Palpation/Percussion Tenderness - Abdomen is non-tender to palpation. Rigidity (guarding) - Abdomen is soft. Auscultation Auscultation of the abdomen reveals - Bowel sounds normal.  Male Genitourinary Note: Not done, not pertinent to present illness   Musculoskeletal Note: He is a well developed male. He is in no apparent distress. His right knee shows no effusion. Range of motion is about 5-130. Moderate crepitus on range of motion. There is some tenderness, medial greater than lateral. No instability.  RADIOGRAPHS: X-rays. He does have bone on bone arthritis medial and patellofemoral in the right knee.   Assessment & Plan Primary localized osteoarthritis of right knee (715.16  M17.11) Status post total left knee replacement using cement (V43.65  [id number])  Note:Plan is for a Right Total Knee Replacement by Dr. Wynelle Link.  Plan is to go to [facility].  PCP - Dr. [place]on Kingfisher  The patient does not have any contraindications and will receive TXA (tranexamic acid) prior to surgery.  Signed electronically by Joelene Millin, III PA-C

## 2014-02-13 DIAGNOSIS — M999 Biomechanical lesion, unspecified: Secondary | ICD-10-CM | POA: Diagnosis not present

## 2014-02-19 DIAGNOSIS — M999 Biomechanical lesion, unspecified: Secondary | ICD-10-CM | POA: Diagnosis not present

## 2014-02-20 ENCOUNTER — Other Ambulatory Visit: Payer: Self-pay | Admitting: Orthopedic Surgery

## 2014-02-20 NOTE — Progress Notes (Signed)
Preoperative surgical orders have been place into the Epic hospital system for George [name] on 02/20/2014, 5:20 PM  by Mickel Crow for surgery on 03/03/2014.  Preop Total Knee orders including Experal, IV Tylenol, and IV Decadron as long as there are no contraindications to the above medications. Arlee Muslim, PA-C

## 2014-02-25 NOTE — Patient Instructions (Addendum)
George [name] [facility]  02/25/2014   Your procedure is scheduled on:  03/03/2014    Report to [facility].  Follow the Signs to [place] at    0830    am  Call this number if you have problems the morning of surgery: [phone]   Remember:   Do not eat food or drink liquids after midnight.   Take these medicines the morning of surgery with A SIP OF WATER: Wellbutrin, Finasteride    Do not wear jewelry,   Do not wear lotions, powders, or perfumes.  deodorant.   Men may shave face and neck.  Do not bring valuables to the hospital.  Contacts, dentures or bridgework may not be worn into surgery.  Leave suitcase in the car. After surgery it may be brought to your room.  For patients admitted to the hospital, checkout time is 11:00 AM the day of  discharge.      [place] - Preparing for Surgery Before surgery, you can play an important role.  Because skin is not sterile, your skin needs to be as free of germs as possible.  You can reduce the number of germs on your skin by washing with CHG (chlorahexidine gluconate) soap before surgery.  CHG is an antiseptic cleaner which kills germs and bonds with the skin to continue killing germs even after washing. Please DO NOT use if you have an allergy to CHG or antibacterial soaps.  If your skin becomes reddened/irritated stop using the CHG and inform your nurse when you arrive at Short Stay. Do not shave (including legs and underarms) for at least 48 hours prior to the first CHG shower.  You may shave your face/neck. Please follow these instructions carefully:  1.  Shower with CHG Soap the night before surgery and the  morning of Surgery.  2.  If you choose to wash your hair, wash your hair first as usual with your  normal  shampoo.  3.  After you shampoo, rinse your hair and body thoroughly to remove the  shampoo.                           4.  Use CHG as you would any other liquid soap.  You can apply chg directly  to the skin  and wash                       Gently with a scrungie or clean washcloth.  5.  Apply the CHG Soap to your body ONLY FROM THE NECK DOWN.   Do not use on face/ open                           Wound or open sores. Avoid contact with eyes, ears mouth and genitals (private parts).                       Wash face,  Genitals (private parts) with your normal soap.             6.  Wash thoroughly, paying special attention to the area where your surgery  will be performed.  7.  Thoroughly rinse your body with warm water from the neck down.  8.  DO NOT shower/wash with your normal soap after using and rinsing off  the CHG Soap.  9.  Pat yourself dry with a clean towel.            10.  Wear clean pajamas.            11.  Place clean sheets on your bed the night of your first shower and do not  sleep with pets. Day of Surgery : Do not apply any lotions/deodorants the morning of surgery.  Please wear clean clothes to the hospital/surgery center.  FAILURE TO FOLLOW THESE INSTRUCTIONS MAY RESULT IN THE CANCELLATION OF YOUR SURGERY PATIENT SIGNATURE_________________________________  NURSE SIGNATURE__________________________________  ________________________________________________________________________  WHAT IS A BLOOD TRANSFUSION? Blood Transfusion Information  A transfusion is the replacement of blood or some of its parts. Blood is made up of multiple cells which provide different functions.  Red blood cells carry oxygen and are used for blood loss replacement.  White blood cells fight against infection.  Platelets control bleeding.  Plasma helps clot blood.  Other blood products are available for specialized needs, such as hemophilia or other clotting disorders. BEFORE THE TRANSFUSION  Who gives blood for transfusions?   Healthy volunteers who are fully evaluated to make sure their blood is safe. This is blood bank blood. Transfusion therapy is the safest it has ever been in  the practice of medicine. Before blood is taken from a donor, a complete history is taken to make sure that person has no history of diseases nor engages in risky social behavior (examples are intravenous drug use or sexual activity with multiple partners). The donor's travel history is screened to minimize risk of transmitting infections, such as malaria. The donated blood is tested for signs of infectious diseases, such as HIV and hepatitis. The blood is then tested to be sure it is compatible with you in order to minimize the chance of a transfusion reaction. If you or a relative donates blood, this is often done in anticipation of surgery and is not appropriate for emergency situations. It takes many days to process the donated blood. RISKS AND COMPLICATIONS Although transfusion therapy is very safe and saves many lives, the main dangers of transfusion include:   Getting an infectious disease.  Developing a transfusion reaction. This is an allergic reaction to something in the blood you were given. Every precaution is taken to prevent this. The decision to have a blood transfusion has been considered carefully by your caregiver before blood is given. Blood is not given unless the benefits outweigh the risks. AFTER THE TRANSFUSION  Right after receiving a blood transfusion, you will usually feel much better and more energetic. This is especially true if your red blood cells have gotten low (anemic). The transfusion raises the level of the red blood cells which carry oxygen, and this usually causes an energy increase.  The nurse administering the transfusion will monitor you carefully for complications. HOME CARE INSTRUCTIONS  No special instructions are needed after a transfusion. You may find your energy is better. Speak with your caregiver about any limitations on activity for underlying diseases you may have. SEEK MEDICAL CARE IF:   Your condition is not improving after your  transfusion.  You develop redness or irritation at the intravenous (IV) site. SEEK IMMEDIATE MEDICAL CARE IF:  Any of the following symptoms occur over the next 12 hours:  Shaking chills.  You have a temperature by mouth above 102 F (38.9 C), not controlled by medicine.  Chest, back, or muscle pain.  People around you feel you are not acting correctly or  are confused.  Shortness of breath or difficulty breathing.  Dizziness and fainting.  You get a rash or develop hives.  You have a decrease in urine output.  Your urine turns a dark color or changes to pink, red, or brown. Any of the following symptoms occur over the next 10 days:  You have a temperature by mouth above 102 F (38.9 C), not controlled by medicine.  Shortness of breath.  Weakness after normal activity.  The white part of the eye turns yellow (jaundice).  You have a decrease in the amount of urine or are urinating less often.  Your urine turns a dark color or changes to pink, red, or brown. Document Released: 05/27/2000 Document Revised: 08/22/2011 Document Reviewed: 01/14/2008 ExitCare Patient Information 2014 [place].  _______________________________________________________________________  Incentive Spirometer  An incentive spirometer is a tool that can help keep your lungs clear and active. This tool measures how well you are filling your lungs with each breath. Taking long deep breaths may help reverse or decrease the chance of developing breathing (pulmonary) problems (especially infection) following:  A long period of time when you are unable to move or be active. BEFORE THE PROCEDURE   If the spirometer includes an indicator to show your best effort, your nurse or respiratory therapist will set it to a desired goal.  If possible, sit up straight or lean slightly forward. Try not to slouch.  Hold the incentive spirometer in an upright position. INSTRUCTIONS FOR USE  1. Sit on the  edge of your bed if possible, or sit up as far as you can in bed or on a chair. 2. Hold the incentive spirometer in an upright position. 3. Breathe out normally. 4. Place the mouthpiece in your mouth and seal your lips tightly around it. 5. Breathe in slowly and as deeply as possible, raising the piston or the ball toward the top of the column. 6. Hold your breath for 3-5 seconds or for as long as possible. Allow the piston or ball to fall to the bottom of the column. 7. Remove the mouthpiece from your mouth and breathe out normally. 8. Rest for a few seconds and repeat Steps 1 through 7 at least 10 times every 1-2 hours when you are awake. Take your time and take a few normal breaths between deep breaths. 9. The spirometer may include an indicator to show your best effort. Use the indicator as a goal to work toward during each repetition. 10. After each set of 10 deep breaths, practice coughing to be sure your lungs are clear. If you have an incision (the cut made at the time of surgery), support your incision when coughing by placing a pillow or rolled up towels firmly against it. Once you are able to get out of bed, walk around indoors and cough well. You may stop using the incentive spirometer when instructed by your caregiver.  RISKS AND COMPLICATIONS  Take your time so you do not get dizzy or light-headed.  If you are in pain, you may need to take or ask for pain medication before doing incentive spirometry. It is harder to take a deep breath if you are having pain. AFTER USE  Rest and breathe slowly and easily.  It can be helpful to keep track of a log of your progress. Your caregiver can provide you with a simple table to help with this. If you are using the spirometer at home, follow these instructions: [place] IF:  You are having difficultly using the spirometer.  You have trouble using the spirometer as often as instructed.  Your pain medication is not giving enough  relief while using the spirometer.  You develop fever of 100.5 F (38.1 C) or higher. SEEK IMMEDIATE MEDICAL CARE IF:   You cough up bloody sputum that had not been present before.  You develop fever of 102 F (38.9 C) or greater.  You develop worsening pain at or near the incision site. MAKE SURE YOU:   Understand these instructions.  Will watch your condition.  Will get help right away if you are not doing well or get worse. Document Released: 10/10/2006 Document Revised: 08/22/2011 Document Reviewed: 12/11/2006 ExitCare Patient Information 2014 ExitCare, [place].   ________________________________________________________________________    Please read over the following fact sheets that you were given: MRSA Information, coughing and deep breathing exercises, leg exercises

## 2014-02-26 ENCOUNTER — Encounter (INDEPENDENT_AMBULATORY_CARE_PROVIDER_SITE_OTHER): Payer: Self-pay

## 2014-02-26 ENCOUNTER — Encounter (HOSPITAL_COMMUNITY): Payer: Self-pay

## 2014-02-26 ENCOUNTER — Encounter (HOSPITAL_COMMUNITY)
Admission: RE | Admit: 2014-02-26 | Discharge: 2014-02-26 | Disposition: A | Discharge status: Home / Self Care | Payer: Medicare Other | Source: Ambulatory Visit | Attending: Orthopedic Surgery | Admitting: Orthopedic Surgery

## 2014-02-26 ENCOUNTER — Encounter (HOSPITAL_COMMUNITY): Payer: Self-pay | Admitting: Pharmacy Technician

## 2014-02-26 DIAGNOSIS — M1710 Unilateral primary osteoarthritis, unspecified knee: Secondary | ICD-10-CM | POA: Insufficient documentation

## 2014-02-26 DIAGNOSIS — Z01812 Encounter for preprocedural laboratory examination: Secondary | ICD-10-CM | POA: Diagnosis not present

## 2014-02-26 HISTORY — DX: Unspecified intestinal obstruction, unspecified as to partial versus complete obstruction: K56.609

## 2014-02-26 HISTORY — DX: Unspecified osteoarthritis, unspecified site: M19.90

## 2014-02-26 HISTORY — DX: Malignant (primary) neoplasm, unspecified: C80.1

## 2014-02-26 LAB — COMPREHENSIVE METABOLIC PANEL
ALBUMIN: 3.6 g/dL (ref 3.5–5.2)
ALT: 24 U/L (ref 0–53)
AST: 24 U/L (ref 0–37)
Alkaline Phosphatase: 75 U/L (ref 39–117)
Anion gap: 11 (ref 5–15)
BILIRUBIN TOTAL: 0.5 mg/dL (ref 0.3–1.2)
BUN: 18 mg/dL (ref 6–23)
CO2: 25 meq/L (ref 19–32)
Calcium: 9.2 mg/dL (ref 8.4–10.5)
Chloride: 104 mEq/L (ref 96–112)
Creatinine, Ser: 0.95 mg/dL (ref 0.50–1.35)
GFR calc non Af Amer: 82 mL/min — ABNORMAL LOW (ref >90)
GLUCOSE: 92 mg/dL (ref 70–99)
POTASSIUM: 4 meq/L (ref 3.7–5.3)
Sodium: 140 mEq/L (ref 137–147)
Total Protein: 6.7 g/dL (ref 6.0–8.3)

## 2014-02-26 LAB — ABO/RH: ABO/RH(D): O POS

## 2014-02-26 LAB — URINALYSIS, ROUTINE W REFLEX MICROSCOPIC
Bilirubin Urine: NEGATIVE
Glucose, UA: NEGATIVE mg/dL
HGB URINE DIPSTICK: NEGATIVE
Ketones, ur: NEGATIVE mg/dL
Leukocytes, UA: NEGATIVE
Nitrite: NEGATIVE
Protein, ur: NEGATIVE mg/dL
Specific Gravity, Urine: 1.017 (ref 1.005–1.030)
Urobilinogen, UA: 0.2 mg/dL (ref 0.0–1.0)
pH: 7.5 (ref 5.0–8.0)

## 2014-02-26 LAB — CBC
HEMATOCRIT: 42.7 % (ref 39.0–52.0)
HEMOGLOBIN: 14.7 g/dL (ref 13.0–17.0)
MCH: 30.9 pg (ref 26.0–34.0)
MCHC: 34.4 g/dL (ref 30.0–36.0)
MCV: 89.9 fL (ref 78.0–100.0)
Platelets: 203 10*3/uL (ref 150–400)
RBC: 4.75 MIL/uL (ref 4.22–5.81)
RDW: 12.1 % (ref 11.5–15.5)
WBC: 7 10*3/uL (ref 4.0–10.5)

## 2014-02-26 LAB — SURGICAL PCR SCREEN
MRSA, PCR: NEGATIVE
STAPHYLOCOCCUS AUREUS: NEGATIVE

## 2014-02-26 LAB — PROTIME-INR
INR: 0.99 (ref 0.00–1.49)
Prothrombin Time: 13.1 s (ref 11.6–15.2)

## 2014-02-26 LAB — APTT: aPTT: 28 s (ref 24–37)

## 2014-02-28 DIAGNOSIS — C44319 Basal cell carcinoma of skin of other parts of face: Secondary | ICD-10-CM | POA: Diagnosis not present

## 2014-02-28 DIAGNOSIS — I781 Nevus, non-neoplastic: Secondary | ICD-10-CM | POA: Diagnosis not present

## 2014-02-28 DIAGNOSIS — D485 Neoplasm of uncertain behavior of skin: Secondary | ICD-10-CM | POA: Diagnosis not present

## 2014-03-03 ENCOUNTER — Inpatient Hospital Stay (HOSPITAL_COMMUNITY)
Admission: RE | Admit: 2014-03-03 | Discharge: 2014-03-05 | DRG: 470 | Disposition: A | Discharge status: Skilled Nursing Facility | Payer: Medicare Other | Source: Ambulatory Visit | Attending: Orthopedic Surgery | Admitting: Orthopedic Surgery

## 2014-03-03 ENCOUNTER — Inpatient Hospital Stay (HOSPITAL_COMMUNITY): Payer: Medicare Other | Admitting: Anesthesiology

## 2014-03-03 ENCOUNTER — Encounter (HOSPITAL_COMMUNITY): Payer: Self-pay | Admitting: *Deleted

## 2014-03-03 ENCOUNTER — Encounter (HOSPITAL_COMMUNITY)
Admission: RE | Disposition: A | Discharge status: Skilled Nursing Facility | Payer: Self-pay | Source: Ambulatory Visit | Attending: Orthopedic Surgery

## 2014-03-03 ENCOUNTER — Encounter (HOSPITAL_COMMUNITY): Payer: Medicare Other | Admitting: Anesthesiology

## 2014-03-03 DIAGNOSIS — F411 Generalized anxiety disorder: Secondary | ICD-10-CM | POA: Diagnosis present

## 2014-03-03 DIAGNOSIS — Z833 Family history of diabetes mellitus: Secondary | ICD-10-CM | POA: Diagnosis not present

## 2014-03-03 DIAGNOSIS — N400 Enlarged prostate without lower urinary tract symptoms: Secondary | ICD-10-CM | POA: Diagnosis not present

## 2014-03-03 DIAGNOSIS — Z8261 Family history of arthritis: Secondary | ICD-10-CM

## 2014-03-03 DIAGNOSIS — Z01812 Encounter for preprocedural laboratory examination: Secondary | ICD-10-CM

## 2014-03-03 DIAGNOSIS — Z96659 Presence of unspecified artificial knee joint: Secondary | ICD-10-CM

## 2014-03-03 DIAGNOSIS — Z85828 Personal history of other malignant neoplasm of skin: Secondary | ICD-10-CM | POA: Diagnosis not present

## 2014-03-03 DIAGNOSIS — Z860100 Personal history of colon polyps, unspecified: Secondary | ICD-10-CM

## 2014-03-03 DIAGNOSIS — Z87891 Personal history of nicotine dependence: Secondary | ICD-10-CM

## 2014-03-03 DIAGNOSIS — M1710 Unilateral primary osteoarthritis, unspecified knee: Principal | ICD-10-CM | POA: Diagnosis present

## 2014-03-03 DIAGNOSIS — M25569 Pain in unspecified knee: Secondary | ICD-10-CM | POA: Diagnosis not present

## 2014-03-03 DIAGNOSIS — F3289 Other specified depressive episodes: Secondary | ICD-10-CM | POA: Diagnosis present

## 2014-03-03 DIAGNOSIS — R269 Unspecified abnormalities of gait and mobility: Secondary | ICD-10-CM | POA: Diagnosis not present

## 2014-03-03 DIAGNOSIS — F329 Major depressive disorder, single episode, unspecified: Secondary | ICD-10-CM | POA: Diagnosis present

## 2014-03-03 DIAGNOSIS — Z7982 Long term (current) use of aspirin: Secondary | ICD-10-CM

## 2014-03-03 DIAGNOSIS — R279 Unspecified lack of coordination: Secondary | ICD-10-CM | POA: Diagnosis not present

## 2014-03-03 DIAGNOSIS — Z471 Aftercare following joint replacement surgery: Secondary | ICD-10-CM | POA: Diagnosis not present

## 2014-03-03 DIAGNOSIS — Z8249 Family history of ischemic heart disease and other diseases of the circulatory system: Secondary | ICD-10-CM

## 2014-03-03 DIAGNOSIS — M179 Osteoarthritis of knee, unspecified: Secondary | ICD-10-CM | POA: Diagnosis present

## 2014-03-03 DIAGNOSIS — M898X9 Other specified disorders of bone, unspecified site: Secondary | ICD-10-CM | POA: Diagnosis present

## 2014-03-03 DIAGNOSIS — M1711 Unilateral primary osteoarthritis, right knee: Secondary | ICD-10-CM

## 2014-03-03 DIAGNOSIS — M1990 Unspecified osteoarthritis, unspecified site: Secondary | ICD-10-CM | POA: Diagnosis not present

## 2014-03-03 DIAGNOSIS — Z79899 Other long term (current) drug therapy: Secondary | ICD-10-CM

## 2014-03-03 DIAGNOSIS — M6281 Muscle weakness (generalized): Secondary | ICD-10-CM | POA: Diagnosis not present

## 2014-03-03 HISTORY — PX: TOTAL KNEE ARTHROPLASTY: SHX125

## 2014-03-03 LAB — TYPE AND SCREEN
ABO/RH(D): O POS
ANTIBODY SCREEN: NEGATIVE

## 2014-03-03 SURGERY — ARTHROPLASTY, KNEE, TOTAL
Anesthesia: Spinal | Site: Knee | Laterality: Right

## 2014-03-03 MED ORDER — SODIUM CHLORIDE 0.9 % IV SOLN: INTRAVENOUS | Status: DC

## 2014-03-03 MED ORDER — MEPERIDINE HCL 50 MG/ML IJ SOLN: 6.2500 mg | INTRAMUSCULAR | Status: DC | PRN

## 2014-03-03 MED ORDER — BUPIVACAINE HCL 0.25 % IJ SOLN
INTRAMUSCULAR | Status: DC | PRN
  Administered 2014-03-03: 20 mL

## 2014-03-03 MED ORDER — PROPOFOL 10 MG/ML IV BOLUS
INTRAVENOUS | Status: AC
  Filled 2014-03-03: qty 20

## 2014-03-03 MED ORDER — BUPIVACAINE LIPOSOME 1.3 % IJ SUSP
20.0000 mL | Freq: Once | INTRAMUSCULAR | Status: DC
  Filled 2014-03-03: qty 20

## 2014-03-03 MED ORDER — DIPHENHYDRAMINE HCL 12.5 MG/5ML PO ELIX
12.5000 mg | ORAL_SOLUTION | ORAL | Status: DC | PRN
Start: 2014-03-03 — End: 2014-03-05

## 2014-03-03 MED ORDER — ACETAMINOPHEN 650 MG RE SUPP: 650.0000 mg | Freq: Four times a day (QID) | RECTAL | Status: DC | PRN

## 2014-03-03 MED ORDER — RIVAROXABAN 10 MG PO TABS
10.0000 mg | ORAL_TABLET | Freq: Every day | ORAL | Status: DC
  Administered 2014-03-04 – 2014-03-05 (×2): 10 mg via ORAL
  Filled 2014-03-03 (×3): qty 1

## 2014-03-03 MED ORDER — DEXAMETHASONE SODIUM PHOSPHATE 10 MG/ML IJ SOLN
INTRAMUSCULAR | Status: AC
  Filled 2014-03-03: qty 1

## 2014-03-03 MED ORDER — MORPHINE SULFATE 2 MG/ML IJ SOLN
1.0000 mg | INTRAMUSCULAR | Status: DC | PRN
Start: 2014-03-03 — End: 2014-03-05
  Administered 2014-03-03: 2 mg via INTRAVENOUS
  Filled 2014-03-03: qty 1

## 2014-03-03 MED ORDER — BUPIVACAINE IN DEXTROSE 0.75-8.25 % IT SOLN
INTRATHECAL | Status: DC | PRN
  Administered 2014-03-03: 1.5 mL via INTRATHECAL

## 2014-03-03 MED ORDER — METHOCARBAMOL 500 MG PO TABS
500.0000 mg | ORAL_TABLET | Freq: Four times a day (QID) | ORAL | Status: DC | PRN
  Administered 2014-03-04 – 2014-03-05 (×5): 500 mg via ORAL
  Filled 2014-03-03 (×5): qty 1

## 2014-03-03 MED ORDER — BUPROPION HCL ER (XL) 300 MG PO TB24
450.0000 mg | ORAL_TABLET | Freq: Every morning | ORAL | Status: DC
  Administered 2014-03-04 – 2014-03-05 (×2): 450 mg via ORAL
  Filled 2014-03-03 (×2): qty 1

## 2014-03-03 MED ORDER — CITALOPRAM HYDROBROMIDE 20 MG PO TABS
20.0000 mg | ORAL_TABLET | Freq: Every day | ORAL | Status: DC
  Administered 2014-03-03 – 2014-03-04 (×2): 20 mg via ORAL
  Filled 2014-03-03 (×3): qty 1

## 2014-03-03 MED ORDER — METOCLOPRAMIDE HCL 5 MG/ML IJ SOLN: 5.0000 mg | Freq: Three times a day (TID) | INTRAMUSCULAR | Status: DC | PRN

## 2014-03-03 MED ORDER — METOCLOPRAMIDE HCL 10 MG PO TABS: 5.0000 mg | ORAL_TABLET | Freq: Three times a day (TID) | ORAL | Status: DC | PRN

## 2014-03-03 MED ORDER — ONDANSETRON HCL 4 MG PO TABS: 4.0000 mg | ORAL_TABLET | Freq: Four times a day (QID) | ORAL | Status: DC | PRN

## 2014-03-03 MED ORDER — ACETAMINOPHEN 10 MG/ML IV SOLN: 1000.0000 mg | Freq: Once | INTRAVENOUS | Status: DC

## 2014-03-03 MED ORDER — PROMETHAZINE HCL 25 MG/ML IJ SOLN: 6.2500 mg | INTRAMUSCULAR | Status: DC | PRN

## 2014-03-03 MED ORDER — OXYCODONE HCL 5 MG PO TABS
5.0000 mg | ORAL_TABLET | ORAL | Status: DC | PRN
  Administered 2014-03-03: 5 mg via ORAL
  Administered 2014-03-03 – 2014-03-05 (×10): 10 mg via ORAL
  Filled 2014-03-03 (×4): qty 2
  Filled 2014-03-03: qty 1
  Filled 2014-03-03 (×6): qty 2

## 2014-03-03 MED ORDER — FENTANYL CITRATE 0.05 MG/ML IJ SOLN
INTRAMUSCULAR | Status: DC | PRN
  Administered 2014-03-03 (×2): 50 ug via INTRAVENOUS

## 2014-03-03 MED ORDER — CHLORHEXIDINE GLUCONATE 4 % EX LIQD: 60.0000 mL | Freq: Once | CUTANEOUS | Status: DC

## 2014-03-03 MED ORDER — ONDANSETRON HCL 4 MG/2ML IJ SOLN
INTRAMUSCULAR | Status: DC | PRN
  Administered 2014-03-03: 4 mg via INTRAVENOUS

## 2014-03-03 MED ORDER — SODIUM CHLORIDE 0.9 % IJ SOLN
INTRAMUSCULAR | Status: AC
  Filled 2014-03-03: qty 50

## 2014-03-03 MED ORDER — SODIUM CHLORIDE 0.9 % IJ SOLN
INTRAMUSCULAR | Status: DC | PRN
Start: 2014-03-03 — End: 2014-03-03
  Administered 2014-03-03: 30 mL

## 2014-03-03 MED ORDER — POLYETHYLENE GLYCOL 3350 17 G PO PACK: 17.0000 g | PACK | Freq: Every day | ORAL | Status: DC | PRN

## 2014-03-03 MED ORDER — TRANEXAMIC ACID 100 MG/ML IV SOLN
1000.0000 mg | INTRAVENOUS | Status: AC
  Administered 2014-03-03: 1000 mg via INTRAVENOUS
  Filled 2014-03-03: qty 10

## 2014-03-03 MED ORDER — CEFAZOLIN SODIUM-DEXTROSE 2-3 GM-% IV SOLR: 2.0000 g | INTRAVENOUS | Status: DC

## 2014-03-03 MED ORDER — ACETAMINOPHEN 500 MG PO TABS
1000.0000 mg | ORAL_TABLET | Freq: Four times a day (QID) | ORAL | Status: AC
  Administered 2014-03-03 – 2014-03-04 (×4): 1000 mg via ORAL
  Filled 2014-03-03 (×4): qty 2

## 2014-03-03 MED ORDER — BISACODYL 10 MG RE SUPP: 10.0000 mg | Freq: Every day | RECTAL | Status: DC | PRN

## 2014-03-03 MED ORDER — BUPIVACAINE HCL (PF) 0.25 % IJ SOLN
INTRAMUSCULAR | Status: AC
  Filled 2014-03-03: qty 30

## 2014-03-03 MED ORDER — DEXAMETHASONE SODIUM PHOSPHATE 10 MG/ML IJ SOLN
10.0000 mg | Freq: Once | INTRAMUSCULAR | Status: AC
  Administered 2014-03-04: 10 mg via INTRAVENOUS
  Filled 2014-03-03: qty 1

## 2014-03-03 MED ORDER — FENTANYL CITRATE 0.05 MG/ML IJ SOLN
INTRAMUSCULAR | Status: AC
  Filled 2014-03-03: qty 2

## 2014-03-03 MED ORDER — PROPOFOL 10 MG/ML IV BOLUS
INTRAVENOUS | Status: DC | PRN
  Administered 2014-03-03: 40 mg via INTRAVENOUS

## 2014-03-03 MED ORDER — METHOCARBAMOL 1000 MG/10ML IJ SOLN
500.0000 mg | Freq: Four times a day (QID) | INTRAVENOUS | Status: DC | PRN
  Filled 2014-03-03: qty 5

## 2014-03-03 MED ORDER — FLEET ENEMA 7-19 GM/118ML RE ENEM: 1.0000 | ENEMA | Freq: Once | RECTAL | Status: AC | PRN

## 2014-03-03 MED ORDER — ACETAMINOPHEN 325 MG PO TABS: 650.0000 mg | ORAL_TABLET | Freq: Four times a day (QID) | ORAL | Status: DC | PRN

## 2014-03-03 MED ORDER — DEXAMETHASONE SODIUM PHOSPHATE 10 MG/ML IJ SOLN: 10.0000 mg | Freq: Once | INTRAMUSCULAR | Status: DC

## 2014-03-03 MED ORDER — MIDAZOLAM HCL 2 MG/2ML IJ SOLN
INTRAMUSCULAR | Status: AC
  Filled 2014-03-03: qty 2

## 2014-03-03 MED ORDER — BUPIVACAINE LIPOSOME 1.3 % IJ SUSP: 20.0000 mL | Freq: Once | INTRAMUSCULAR | Status: DC

## 2014-03-03 MED ORDER — DEXTROSE-NACL 5-0.9 % IV SOLN
INTRAVENOUS | Status: DC
  Administered 2014-03-03 – 2014-03-04 (×2): via INTRAVENOUS

## 2014-03-03 MED ORDER — KETOROLAC TROMETHAMINE 15 MG/ML IJ SOLN: 7.5000 mg | Freq: Four times a day (QID) | INTRAMUSCULAR | Status: AC | PRN

## 2014-03-03 MED ORDER — LACTATED RINGERS IV SOLN
INTRAVENOUS | Status: DC
  Administered 2014-03-03 (×2): via INTRAVENOUS

## 2014-03-03 MED ORDER — CEFAZOLIN SODIUM-DEXTROSE 2-3 GM-% IV SOLR
INTRAVENOUS | Status: AC
  Filled 2014-03-03: qty 50

## 2014-03-03 MED ORDER — ONDANSETRON HCL 4 MG/2ML IJ SOLN
INTRAMUSCULAR | Status: AC
  Filled 2014-03-03: qty 2

## 2014-03-03 MED ORDER — MENTHOL 3 MG MT LOZG
1.0000 | LOZENGE | OROMUCOSAL | Status: DC | PRN
  Filled 2014-03-03: qty 9

## 2014-03-03 MED ORDER — PHENOL 1.4 % MT LIQD
1.0000 | OROMUCOSAL | Status: DC | PRN
  Filled 2014-03-03: qty 177

## 2014-03-03 MED ORDER — ONDANSETRON HCL 4 MG/2ML IJ SOLN: 4.0000 mg | Freq: Four times a day (QID) | INTRAMUSCULAR | Status: DC | PRN

## 2014-03-03 MED ORDER — MIDAZOLAM HCL 5 MG/5ML IJ SOLN
INTRAMUSCULAR | Status: DC | PRN
  Administered 2014-03-03 (×2): 1 mg via INTRAVENOUS

## 2014-03-03 MED ORDER — CEFAZOLIN SODIUM-DEXTROSE 2-3 GM-% IV SOLR
2.0000 g | INTRAVENOUS | Status: AC
  Administered 2014-03-03: 2 g via INTRAVENOUS

## 2014-03-03 MED ORDER — DOXAZOSIN MESYLATE 8 MG PO TABS
8.0000 mg | ORAL_TABLET | Freq: Every day | ORAL | Status: DC
  Administered 2014-03-03 – 2014-03-04 (×2): 8 mg via ORAL
  Filled 2014-03-03 (×3): qty 1

## 2014-03-03 MED ORDER — FINASTERIDE 5 MG PO TABS
5.0000 mg | ORAL_TABLET | Freq: Every morning | ORAL | Status: DC
Start: 2014-03-04 — End: 2014-03-05
  Administered 2014-03-04 – 2014-03-05 (×2): 5 mg via ORAL
  Filled 2014-03-03 (×2): qty 1

## 2014-03-03 MED ORDER — CEFAZOLIN SODIUM-DEXTROSE 2-3 GM-% IV SOLR
2.0000 g | Freq: Four times a day (QID) | INTRAVENOUS | Status: AC
  Administered 2014-03-03 (×2): 2 g via INTRAVENOUS
  Filled 2014-03-03 (×2): qty 50

## 2014-03-03 MED ORDER — FENTANYL CITRATE 0.05 MG/ML IJ SOLN: 25.0000 ug | INTRAMUSCULAR | Status: DC | PRN

## 2014-03-03 MED ORDER — DOCUSATE SODIUM 100 MG PO CAPS
100.0000 mg | ORAL_CAPSULE | Freq: Two times a day (BID) | ORAL | Status: DC
  Administered 2014-03-04 – 2014-03-05 (×3): 100 mg via ORAL

## 2014-03-03 MED ORDER — BUPIVACAINE LIPOSOME 1.3 % IJ SUSP
INTRAMUSCULAR | Status: DC | PRN
  Administered 2014-03-03: 20 mL

## 2014-03-03 MED ORDER — ACETAMINOPHEN 10 MG/ML IV SOLN
1000.0000 mg | Freq: Once | INTRAVENOUS | Status: AC
  Administered 2014-03-03: 1000 mg via INTRAVENOUS
  Filled 2014-03-03: qty 100

## 2014-03-03 MED ORDER — DEXAMETHASONE SODIUM PHOSPHATE 10 MG/ML IJ SOLN
10.0000 mg | Freq: Once | INTRAMUSCULAR | Status: AC
  Administered 2014-03-03: 10 mg via INTRAVENOUS

## 2014-03-03 MED ORDER — PROPOFOL INFUSION 10 MG/ML OPTIME
INTRAVENOUS | Status: DC | PRN
Start: 2014-03-03 — End: 2014-03-03
  Administered 2014-03-03: 75 ug/kg/min via INTRAVENOUS

## 2014-03-03 SURGICAL SUPPLY — 63 items
BAG SPEC THK2 15X12 ZIP CLS (MISCELLANEOUS) ×1
BAG ZIPLOCK 12X15 (MISCELLANEOUS) ×3 IMPLANT
BANDAGE ELASTIC 6 VELCRO ST LF (GAUZE/BANDAGES/DRESSINGS) ×3 IMPLANT
BANDAGE ESMARK 6X9 LF (GAUZE/BANDAGES/DRESSINGS) ×1 IMPLANT
BLADE SAG 18X100X1.27 (BLADE) ×3 IMPLANT
BLADE SAW SGTL 11.0X1.19X90.0M (BLADE) ×3 IMPLANT
BNDG CMPR 9X6 STRL LF SNTH (GAUZE/BANDAGES/DRESSINGS) ×1
BNDG ESMARK 6X9 LF (GAUZE/BANDAGES/DRESSINGS) ×3
BOWL SMART MIX CTS (DISPOSABLE) ×3 IMPLANT
CAPT RP KNEE ×2 IMPLANT
CEMENT HV SMART SET (Cement) ×6 IMPLANT
CLOSURE WOUND 1/2 X4 (GAUZE/BANDAGES/DRESSINGS) ×1
CUFF TOURN SGL QUICK 34 (TOURNIQUET CUFF) ×3
CUFF TRNQT CYL 34X4X40X1 (TOURNIQUET CUFF) ×1 IMPLANT
DECANTER SPIKE VIAL GLASS SM (MISCELLANEOUS) ×3 IMPLANT
DRAPE EXTREMITY TIBURON (DRAPES) ×3 IMPLANT
DRAPE POUCH INSTRU U-SHP 10X18 (DRAPES) ×3 IMPLANT
DRAPE U-SHAPE 47X51 STRL (DRAPES) ×3 IMPLANT
DRSG ADAPTIC 3X8 NADH LF (GAUZE/BANDAGES/DRESSINGS) ×3 IMPLANT
DRSG PAD ABDOMINAL 8X10 ST (GAUZE/BANDAGES/DRESSINGS) ×1 IMPLANT
DURAPREP 26ML APPLICATOR (WOUND CARE) ×3 IMPLANT
ELECT REM PT RETURN 9FT ADLT (ELECTROSURGICAL) ×3
ELECTRODE REM PT RTRN 9FT ADLT (ELECTROSURGICAL) ×1 IMPLANT
EVACUATOR 1/8 PVC DRAIN (DRAIN) ×3 IMPLANT
FACESHIELD WRAPAROUND (MASK) ×15 IMPLANT
FACESHIELD WRAPAROUND OR TEAM (MASK) ×5 IMPLANT
GAUZE SPONGE 4X4 12PLY STRL (GAUZE/BANDAGES/DRESSINGS) ×3 IMPLANT
GLOVE BIO SURGEON STRL SZ7.5 (GLOVE) IMPLANT
GLOVE BIO SURGEON STRL SZ8 (GLOVE) ×3 IMPLANT
GLOVE BIOGEL PI IND STRL 6.5 (GLOVE) IMPLANT
GLOVE BIOGEL PI IND STRL 8 (GLOVE) ×1 IMPLANT
GLOVE BIOGEL PI INDICATOR 6.5 (GLOVE)
GLOVE BIOGEL PI INDICATOR 8 (GLOVE) ×2
GLOVE SURG SS PI 6.5 STRL IVOR (GLOVE) IMPLANT
GOWN STRL REUS W/TWL LRG LVL3 (GOWN DISPOSABLE) ×3 IMPLANT
GOWN STRL REUS W/TWL XL LVL3 (GOWN DISPOSABLE) IMPLANT
HANDPIECE INTERPULSE COAX TIP (DISPOSABLE) ×3
IMMOBILIZER KNEE 20 (SOFTGOODS) ×2 IMPLANT
IMMOBILIZER KNEE 20 THIGH 36 (SOFTGOODS) ×1 IMPLANT
KIT BASIN OR (CUSTOM PROCEDURE TRAY) ×3 IMPLANT
MANIFOLD NEPTUNE II (INSTRUMENTS) ×3 IMPLANT
NDL SAFETY ECLIPSE 18X1.5 (NEEDLE) ×2 IMPLANT
NEEDLE HYPO 18GX1.5 SHARP (NEEDLE) ×6
NS IRRIG 1000ML POUR BTL (IV SOLUTION) ×3 IMPLANT
PACK TOTAL JOINT (CUSTOM PROCEDURE TRAY) ×3 IMPLANT
PAD ABD 8X10 STRL (GAUZE/BANDAGES/DRESSINGS) ×2 IMPLANT
PADDING CAST COTTON 6X4 STRL (CAST SUPPLIES) ×4 IMPLANT
POSITIONER SURGICAL ARM (MISCELLANEOUS) ×3 IMPLANT
SET HNDPC FAN SPRY TIP SCT (DISPOSABLE) ×1 IMPLANT
STRIP CLOSURE SKIN 1/2X4 (GAUZE/BANDAGES/DRESSINGS) ×3 IMPLANT
SUCTION FRAZIER 12FR DISP (SUCTIONS) ×3 IMPLANT
SUT MNCRL AB 4-0 PS2 18 (SUTURE) ×3 IMPLANT
SUT VIC AB 2-0 CT1 27 (SUTURE) ×9
SUT VIC AB 2-0 CT1 TAPERPNT 27 (SUTURE) ×3 IMPLANT
SUT VLOC 180 0 24IN GS25 (SUTURE) ×3 IMPLANT
SYR 20CC LL (SYRINGE) ×3 IMPLANT
SYR 50ML LL SCALE MARK (SYRINGE) ×3 IMPLANT
TOWEL OR 17X26 10 PK STRL BLUE (TOWEL DISPOSABLE) ×3 IMPLANT
TOWEL OR NON WOVEN STRL DISP B (DISPOSABLE) IMPLANT
TRAY FOLEY CATH 14FRSI W/METER (CATHETERS) ×1 IMPLANT
TRAY FOLEY CATH 16FRSI W/METER (SET/KITS/TRAYS/PACK) ×2 IMPLANT
WATER STERILE IRR 1500ML POUR (IV SOLUTION) ×3 IMPLANT
WRAP KNEE MAXI GEL POST OP (GAUZE/BANDAGES/DRESSINGS) ×3 IMPLANT

## 2014-03-03 NOTE — H&P (View-Only) (Signed)
Pape L. Levario DOB: 12/14/1940 Widowed / Language: English / Race: White Male Date of Admission: 03/03/2014 Chief Complaint:  Right Knee Pain History of Present Illness The patient is a 73 year old male who comes in for a preoperative history and physical. The patient is scheduled for a right total knee arthroplasty to be performed by Dr. Dione Plover. Aluisio, MD at [facility] on 03/03/2014. The patient is a 73 year old male who presents for follow up of their knee. The patient is being followed for their right knee pain and osteoarthritis. They are now sevveral month(s) out from a cortisone injection. Symptoms reported today include: pain, aching, stiffness and instability (especially with stairs). The patient feels that they are doing poorly and report their pain level to be mild to moderate. Current treatment includes: NSAIDs (Celebrex; Rx from Dr. Kenton Kingfisher, his pcp). The patient has not gotten any relief of their symptoms with Cortisone injections. Note for "Follow-up Knee": He is using the bike at the gym. He states the knee feels good while he is on the bike, but stiffens back up as soon as he gets off. Mr. Biegel came in following his cortisone injection into the right knee. This was his second injection. The first shot did very well but this most recent shot only helped for about two weeks and then wore off. He has tried the gel series in the left knee prior to having the left total knee replacement done and got no benefit from the series. He is not interested in going thru the series again in the right knee. He is ready to proceed with surgery at this time. They have been treated conservatively in the past for the above stated problem and despite conservative measures, they continue to have progressive pain and severe functional limitations and dysfunction. They have failed non-operative management including home exercise, medications, and injections. It is felt that they would  benefit from undergoing total joint replacement. Risks and benefits of the procedure have been discussed with the patient and they elect to proceed with surgery. There are no active contraindications to surgery such as ongoing infection or rapidly progressive neurological disease.  Allergies No Known Drug Allergies  Problem List/Past Medical  S/P total knee replacement (V43.65  Z96.659) Primary localized osteoarthritis of right knee (715.16  M17.11) Anxiety Disorder Depression Osteoarthritis Seizure Disorder one episode - 1982 Impaired Vision wears glasses Tinnitus Vertigo rare occasion Hiatal Hernia Hemorrhoids some recent bleeding Skin Cancer Basal Cell - Nose Area  Family History Congestive Heart Failure Mother. mother Depression mother Rheumatoid Arthritis mother Diabetes Mellitus mother Heart disease in male family member before age 44 Hypertension First Degree Relatives. 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 former smoker; smoke(d) 1 1/2 pack(s) per day  Medication History  CeleBREX (200MG  Capsule, Oral) Active. Wellbutrin (Oral) Specific dose unknown - Active. Doxazosin Mesylate (Oral) Specific dose unknown - Active. Finasteride (Oral) Specific dose unknown - Active. Multivitamins (Oral) Active. Aspirin (81MG  Tablet, Oral daily) Active. Citalopram Hydrobromide (20MG  Tablet, Oral) Active.  Past Surgical History  Arthroscopy of Knee left Colectomy partial - precancerous area Colon Polyp Removal - Open Total Knee Replacement left  Review of Systems General  Present- Fatigue. Not Present- Chills, Fever, Memory Loss, Night Sweats, Weight Gain and Weight Loss. Skin Not Present- Eczema, Hives, Itching, Lesions and Rash. HEENT Present- Tinnitus. Not Present- Dentures, Double Vision, Headache, Hearing Loss and Visual Loss. Respiratory Not Present- Allergies, Chronic Cough, Coughing up blood, Shortness of breath at rest and Shortness of breath with exertion. Cardiovascular Not Present- Chest Pain, Difficulty Breathing Lying Down, Murmur, Palpitations, Racing/skipping heartbeats and Swelling. Gastrointestinal Not Present- Abdominal Pain, Bloody Stool, Constipation, Diarrhea, Difficulty Swallowing, Heartburn, Jaundice, Loss of appetitie, Nausea and Vomiting. Male Genitourinary Present- Urinary frequency, Urinating at Night and Weak urinary stream. Not Present- Blood in Urine, Discharge, Flank Pain, Incontinence, Painful Urination, Urgency and Urinary Retention. Musculoskeletal Present- Back Pain, Joint Pain and Morning Stiffness. Not Present- Joint Swelling, Muscle Pain, Muscle Weakness and Spasms. Neurological Present- Tremor. Not Present- Blackout spells, Difficulty with balance, Dizziness, Paralysis and Weakness. Psychiatric Not Present- Insomnia.  Vitals  Weight: 175 lb Height: 69in Weight was reported by patient. Height was reported by patient. Body Surface Area: 1.97 m Body Mass Index: 25.84 kg/m BP: 118/64 (Sitting, Right Arm, Standard)  Physical Exam  General Mental Status -Alert, cooperative and good historian. General Appearance-pleasant, Not in acute distress. Orientation-Oriented X3. Build & Nutrition-Well nourished and Well developed.  Head and Neck Head-normocephalic, atraumatic . Neck Global Assessment - supple, no bruit auscultated on the right, no bruit auscultated on the left.  Eye Pupil - Bilateral-Regular and Round. Motion - Bilateral-EOMI.  Chest and Lung Exam Auscultation Breath sounds - clear at  anterior chest wall and clear at posterior chest wall. Adventitious sounds - No Adventitious sounds.  Cardiovascular Auscultation Rhythm - Regular rate and rhythm. Heart Sounds - S1 WNL and S2 WNL. Murmurs & Other Heart Sounds - Auscultation of the heart reveals - No Murmurs.  Abdomen Palpation/Percussion Tenderness - Abdomen is non-tender to palpation. Rigidity (guarding) - Abdomen is soft. Auscultation Auscultation of the abdomen reveals - Bowel sounds normal.  Male Genitourinary Note: Not done, not pertinent to present illness   Musculoskeletal Note: He is a well developed male. He is in no apparent distress. His right knee shows no effusion. Range of motion is about 5-130. Moderate crepitus on range of motion. There is some tenderness, medial greater than lateral. No instability.  RADIOGRAPHS: X-rays. He does have bone on bone arthritis medial and patellofemoral in the right knee.   Assessment & Plan Primary localized osteoarthritis of right knee (715.16  M17.11) Status post total left knee replacement using cement (V43.65  [id number])  Note:Plan is for a Right Total Knee Replacement by Dr. Wynelle Link.  Plan is to go to [facility].  PCP - Dr. Kenton Kingfisher  The patient does not have any contraindications and will receive TXA (tranexamic acid) prior to surgery.  Signed electronically by Joelene Millin, III PA-C

## 2014-03-03 NOTE — Anesthesia Procedure Notes (Signed)
Spinal Patient location during procedure: OR Staffing Anesthesiologist: Anden Bartolo Performed by: anesthesiologist  Preanesthetic Checklist Completed: patient identified, site marked, surgical consent, pre-op evaluation, timeout performed, IV checked, risks and benefits discussed and monitors and equipment checked Spinal Block Patient position: sitting Prep: Betadine Patient monitoring: heart rate, continuous pulse ox and blood pressure Approach: left paramedian Location: L4-5 Injection technique: single-shot Needle Needle type: Spinocan  Needle gauge: 22 G Needle length: 9 cm Additional Notes Expiration date of kit checked and confirmed. Patient tolerated procedure well, without complications.     

## 2014-03-03 NOTE — Anesthesia Preprocedure Evaluation (Signed)
Anesthesia Evaluation  Patient identified by MRN, date of birth, ID band Patient awake    Reviewed: Allergy & Precautions, H&P , NPO status , Patient's Chart, lab work & pertinent test results  Airway Mallampati: II TM Distance: >3 FB Neck ROM: Full    Dental no notable dental hx.    Pulmonary neg pulmonary ROS, former smoker,  breath sounds clear to auscultation  Pulmonary exam normal       Cardiovascular negative cardio ROS  Rhythm:Regular Rate:Normal     Neuro/Psych negative neurological ROS  negative psych ROS   GI/Hepatic negative GI ROS, Neg liver ROS,   Endo/Other  negative endocrine ROS  Renal/GU negative Renal ROS  negative genitourinary   Musculoskeletal negative musculoskeletal ROS (+)   Abdominal   Peds negative pediatric ROS (+)  Hematology negative hematology ROS (+)   Anesthesia Other Findings   Reproductive/Obstetrics negative OB ROS                           Anesthesia Physical Anesthesia Plan  ASA: II  Anesthesia Plan: Spinal   Post-op Pain Management:    Induction:   Airway Management Planned: Simple Face Mask  Additional Equipment:   Intra-op Plan:   Post-operative Plan:   Informed Consent: I have reviewed the patients History and Physical, chart, labs and discussed the procedure including the risks, benefits and alternatives for the proposed anesthesia with the patient or authorized representative who has indicated his/her understanding and acceptance.   Dental advisory given  Plan Discussed with: CRNA  Anesthesia Plan Comments:         Anesthesia Quick Evaluation

## 2014-03-03 NOTE — Transfer of Care (Signed)
Immediate Anesthesia Transfer of Care Note  Patient: George [name] [facility]  Procedure(s) Performed: Procedure(s): RIGHT TOTAL KNEE ARTHROPLASTY (Right)  Patient Location: PACU  Anesthesia Type:Spinal  Level of Consciousness: awake, alert , oriented and patient cooperative  Airway & Oxygen Therapy: Patient Spontanous Breathing and Patient connected to face mask oxygen  Post-op Assessment: Report given to PACU RN and Post -op Vital signs reviewed and stable  Post vital signs: Reviewed and stable  Complications: No apparent anesthesia complications

## 2014-03-03 NOTE — Interval H&P Note (Signed)
History and Physical Interval Note:  03/03/2014 10:19 AM  George [name]  has presented today for surgery, with the diagnosis of right knee osteoarthritis  The various methods of treatment have been discussed with the patient and family. After consideration of risks, benefits and other options for treatment, the patient has consented to  Procedure(s): RIGHT TOTAL KNEE ARTHROPLASTY (Right) as a surgical intervention .  The patient's history has been reviewed, patient examined, no change in status, stable for surgery.  I have reviewed the patient's chart and labs.  Questions were answered to the patient's satisfaction.     Gearlean Alf

## 2014-03-03 NOTE — Progress Notes (Signed)
Utilization review completed.  

## 2014-03-03 NOTE — Op Note (Signed)
Pre-operative diagnosis- Osteoarthritis  Right knee(s)  Post-operative diagnosis- Osteoarthritis Right knee(s)  Procedure-  Right  Total Knee Arthroplasty  Surgeon- George [name]. George Kamer, MD  Assistant- George Jourdain, PA-C   Anesthesia-  Spinal  EBL-* No blood loss amount entered *   Drains Hemovac  Tourniquet time-  Total Tourniquet Time Documented: Thigh (Right) - 30 minutes Total: Thigh (Right) - 30 minutes     Complications- None  Condition-PACU - hemodynamically stable.   Brief Clinical Note  George [name] is a 73 y.o. year old male with end stage OA of his right knee with progressively worsening pain and dysfunction. He has constant pain, with activity and at rest and significant functional deficits with difficulties even with ADLs. He has had extensive non-op management including analgesics, injections of cortisone, and home exercise program, but remains in significant pain with significant dysfunction. Radiographs show bone on bone arthritis medial and patellofemoral. He presents now for right Total Knee Arthroplasty.    Procedure in detail---   The patient is brought into the operating room and positioned supine on the operating table. After successful administration of  Spinal,   a tourniquet is placed high on the  Right thigh(s) and the lower extremity is prepped and draped in the usual sterile fashion. Time out is performed by the operating team and then the  Right lower extremity is wrapped in Esmarch, knee flexed and the tourniquet inflated to 300 mmHg.       A midline incision is made with a ten blade through the subcutaneous tissue to the level of the extensor mechanism. A fresh blade is used to make a medial parapatellar arthrotomy. Soft tissue over the proximal medial tibia is subperiosteally elevated to the joint line with a knife and into the semimembranosus bursa with a Cobb elevator. Soft tissue over the proximal lateral tibia is elevated with attention being  paid to avoiding the patellar tendon on the tibial tubercle. The patella is everted, knee flexed 90 degrees and the ACL and PCL are removed. Findings are bone on bone medial and patellofemoral with large medial osteophytes.        The drill is used to create a starting hole in the distal femur and the canal is thoroughly irrigated with sterile saline to remove the fatty contents. The 5 degree Right  valgus alignment guide is placed into the femoral canal and the distal femoral cutting block is pinned to remove 10 mm off the distal femur. Resection is made with an oscillating saw.      The tibia is subluxed forward and the menisci are removed. The extramedullary alignment guide is placed referencing proximally at the medial aspect of the tibial tubercle and distally along the second metatarsal axis and tibial crest. The block is pinned to remove 15mm off the more deficient medial  side. Resection is made with an oscillating saw. Size 4is the most appropriate size for the tibia and the proximal tibia is prepared with the modular drill and keel punch for that size.      The femoral sizing guide is placed and size 4 is most appropriate. Rotation is marked off the epicondylar axis and confirmed by creating a rectangular flexion gap at 90 degrees. The size 4 cutting block is pinned in this rotation and the anterior, posterior and chamfer cuts are made with the oscillating saw. The intercondylar block is then placed and that cut is made.      Trial size 4 tibial component, trial size  4 posterior stabilized femur and a 15  mm posterior stabilized rotating platform insert trial is placed. Full extension is achieved with excellent varus/valgus and anterior/posterior balance throughout full range of motion. The patella is everted and thickness measured to be 24  mm. Free hand resection is taken to 14 mm, a 38 template is placed, lug holes are drilled, trial patella is placed, and it tracks normally. Osteophytes are removed  off the posterior femur with the trial in place. All trials are removed and the cut bone surfaces prepared with pulsatile lavage. Cement is mixed and once ready for implantation, the size 4 tibial implant, size  4 posterior stabilized femoral component, and the size 38 patella are cemented in place and the patella is held with the clamp. The trial insert is placed and the knee held in full extension. The Exparel (20 ml mixed with 30 ml saline) and .25% Bupivicaine, are injected into the extensor mechanism, posterior capsule, medial and lateral gutters and subcutaneous tissues.  All extruded cement is removed and once the cement is hard the permanent 15 mm posterior stabilized rotating platform insert is placed into the tibial tray.      The wound is copiously irrigated with saline solution and the extensor mechanism closed over a hemovac drain with #1 V-loc suture. The tourniquet is released for a total tourniquet time of 30  minutes. Flexion against gravity is 140 degrees and the patella tracks normally. Subcutaneous tissue is closed with 2.0 vicryl and subcuticular with running 4.0 Monocryl. The incision is cleaned and dried and steri-strips and a bulky sterile dressing are applied. The limb is placed into a knee immobilizer and the patient is awakened and transported to recovery in stable condition.      Please note that a surgical assistant was a medical necessity for this procedure in order to perform it in a safe and expeditious manner. Surgical assistant was necessary to retract the ligaments and vital neurovascular structures to prevent injury to them and also necessary for proper positioning of the limb to allow for anatomic placement of the prosthesis.   George [name] George Sowle, MD    03/03/2014, 12:21 PM

## 2014-03-03 NOTE — Anesthesia Postprocedure Evaluation (Signed)
  Anesthesia Post-op Note  Patient: George [name]  Procedure(s) Performed: Procedure(s) (LRB): RIGHT TOTAL KNEE ARTHROPLASTY (Right)  Patient Location: PACU  Anesthesia Type: Spinal  Level of Consciousness: awake and alert   Airway and Oxygen Therapy: Patient Spontanous Breathing  Post-op Pain: mild  Post-op Assessment: Post-op Vital signs reviewed, Patient's Cardiovascular Status Stable, Respiratory Function Stable, Patent Airway and No signs of Nausea or vomiting  Last Vitals:  Filed Vitals:   03/03/14 1330  BP: 120/71  Pulse: 77  Temp:   Resp: 10    Post-op Vital Signs: stable   Complications: No apparent anesthesia complications

## 2014-03-04 LAB — CBC
HCT: 34.4 % — ABNORMAL LOW (ref 39.0–52.0)
HEMOGLOBIN: 12.2 g/dL — AB (ref 13.0–17.0)
MCH: 31.4 pg (ref 26.0–34.0)
MCHC: 35.5 g/dL (ref 30.0–36.0)
MCV: 88.7 fL (ref 78.0–100.0)
Platelets: 202 10*3/uL (ref 150–400)
RBC: 3.88 MIL/uL — ABNORMAL LOW (ref 4.22–5.81)
RDW: 12.1 % (ref 11.5–15.5)
WBC: 11.2 10*3/uL — ABNORMAL HIGH (ref 4.0–10.5)

## 2014-03-04 LAB — BASIC METABOLIC PANEL
Anion gap: 9 (ref 5–15)
BUN: 13 mg/dL (ref 6–23)
CALCIUM: 8.2 mg/dL — AB (ref 8.4–10.5)
CO2: 24 meq/L (ref 19–32)
CREATININE: 0.84 mg/dL (ref 0.50–1.35)
Chloride: 107 mEq/L (ref 96–112)
GFR calc Af Amer: >90 mL/min (ref >90)
GFR calc non Af Amer: 87 mL/min — ABNORMAL LOW (ref >90)
GLUCOSE: 189 mg/dL — AB (ref 70–99)
Potassium: 4 mEq/L (ref 3.7–5.3)
Sodium: 140 mEq/L (ref 137–147)

## 2014-03-04 NOTE — Progress Notes (Signed)
Clinical Social Work Department CLINICAL SOCIAL WORK PLACEMENT NOTE 03/04/2014  Patient:  George [name], George [name]  Account Number:  [id number] [place] date:  03/03/2014  Clinical Social Worker:  Werner Lean, LCSW  Date/time:  03/04/2014 01:44 PM  Clinical Social Work is seeking post-discharge placement for this patient at the following level of care:   SKILLED NURSING   (*CSW will update this form in Epic as items are completed)     Patient/family provided with [facility] Department of Clinical Social Work's list of facilities offering this level of care within the geographic area requested by the patient (or if unable, by the patient's family).  03/04/2014  Patient/family informed of their freedom to choose among providers that offer the needed level of care, that participate in Medicare, Medicaid or managed care program needed by the patient, have an available bed and are willing to accept the patient.    Patient/family informed of [facility]' ownership interest in [facility], as well as of the fact that they are under no obligation to receive care at this facility.  PASARR submitted to EDS on 03/03/2014 PASARR number received on 03/03/2014  FL2 transmitted to all facilities in geographic area requested by pt/family on  03/04/2014 FL2 transmitted to all facilities within larger geographic area on   Patient informed that his/her managed care company has contracts with or will negotiate with  certain facilities, including the following:     Patient/family informed of bed offers received:  03/04/2014 Patient chooses bed at  Physician recommends and patient chooses bed at  [place]  Patient to be transferred to  on   Patient to be transferred to facility by  Patient and family notified of transfer on  Name of family member notified:    The following physician request were entered in Epic:   Additional Comments:  Werner Lean LCSW [phone]

## 2014-03-04 NOTE — Discharge Instructions (Addendum)
° °Dr. Frank Aluisio °Total Joint Specialist °[facility] °[address] °[place], [place] [address] °[phone] ° °TOTAL KNEE REPLACEMENT POSTOPERATIVE DIRECTIONS ° ° ° °Knee Rehabilitation, Guidelines Following Surgery  °Results after knee surgery are often greatly improved when you follow the exercise, range of motion and muscle strengthening exercises prescribed by your doctor. Safety measures are also important to protect the knee from further injury. Any time any of these exercises cause you to have increased pain or swelling in your knee joint, decrease the amount until you are comfortable again and slowly increase them. If you have problems or questions, call your caregiver or physical therapist for advice.  ° °HOME CARE INSTRUCTIONS  °Remove items at home which could result in a fall. This includes throw rugs or furniture in walking pathways.  °Continue medications as instructed at time of discharge. °You may have some home medications which will be placed on hold until you complete the course of blood thinner medication.  °You may start showering once you are discharged home but do not submerge the incision under water. Just pat the incision dry and apply a dry gauze dressing on daily. °Walk with walker as instructed.  °You may resume a sexual relationship in one month or when given the OK by  your doctor.  °· Use walker as long as suggested by your caregivers. °· Avoid periods of inactivity such as sitting longer than an hour when not asleep. This helps prevent blood clots.  °You may put full weight on your legs and walk as much as is comfortable.  °You may return to work once you are cleared by your doctor.  °Do not drive a car for 6 weeks or until released by you surgeon.  °· Do not drive while taking narcotics.  °Wear the elastic stockings for three weeks following surgery during the day but you may remove then at night. °Make sure you keep all of your appointments after your  operation with all of your doctors and caregivers. You should call the office at the above phone number and make an appointment for approximately two weeks after the date of your surgery. °Change the dressing daily and reapply a dry dressing each time. °Please pick up a stool softener and laxative for home use as long as you are requiring pain medications. °· Continue to use ice on the knee for pain and swelling from surgery. You may notice swelling that will progress down to the foot and ankle.  This is normal after surgery.  Elevate the leg when you are not up walking on it.   °It is important for you to complete the blood thinner medication as prescribed by your doctor. °· Continue to use the breathing machine which will help keep your temperature down.  It is common for your temperature to cycle up and down following surgery, especially at night when you are not up moving around and exerting yourself.  The breathing machine keeps your lungs expanded and your temperature down. ° °RANGE OF MOTION AND STRENGTHENING EXERCISES  °Rehabilitation of the knee is important following a knee injury or an operation. After just a few days of immobilization, the muscles of the thigh which control the knee become weakened and shrink (atrophy). Knee exercises are designed to build up the tone and strength of the thigh muscles and to improve knee motion. Often times heat used for twenty to thirty minutes before working out will loosen up your tissues and help with improving the   range of motion but do not use heat for the first two weeks following surgery. These exercises can be done on a training (exercise) mat, on the floor, on a table or on a bed. Use what ever works the best and is most comfortable for you Knee exercises include:  Leg Lifts - While your knee is still immobilized in a splint or cast, you can do straight leg raises. Lift the leg to 60 degrees, hold for 3 sec, and slowly lower the leg. Repeat 10-20 times 2-3  times daily. Perform this exercise against resistance later as your knee gets better.  Quad and Hamstring Sets - Tighten up the muscle on the front of the thigh (Quad) and hold for 5-10 sec. Repeat this 10-20 times hourly. Hamstring sets are done by pushing the foot backward against an object and holding for 5-10 sec. Repeat as with quad sets.  A rehabilitation program following serious knee injuries can speed recovery and prevent re-injury in the future due to weakened muscles. Contact your doctor or a physical therapist for more information on knee rehabilitation.   SKILLED REHAB INSTRUCTIONS: If the patient is transferred to a skilled rehab facility following release from the hospital, a list of the current medications will be sent to the facility for the patient to continue.  When discharged from the skilled rehab facility, please have the facility set up the patient's [place] prior to being released. Also, the skilled facility will be responsible for providing the patient with their medications at time of release from the facility to include their pain medication, the muscle relaxants, and their blood thinner medication. If the patient is still at the rehab facility at time of the two week follow up appointment, the skilled rehab facility will also need to assist the patient in arranging follow up appointment in our office and any transportation needs.  MAKE SURE YOU:  Understand these instructions.  Will watch your condition.  Will get help right away if you are not doing well or get worse.    Pick up stool softner and laxative for home. Do not submerge incision under water. May shower. Continue to use ice for pain and swelling from surgery.  Take Xarelto for two and a half more weeks, then discontinue Xarelto. Once the patient has completed the Xarelto, they may resume the 81 mg Aspirin.  When discharged from the skilled rehab facility, please have the facility set up  the patient's [place] prior to being released.  Also provide the patient with their medications at time of release from the facility to include their pain medication, the muscle relaxants, and their blood thinner medication.  If the patient is still at the rehab facility at time of follow up appointment, please also assist the patient in arranging follow up appointment in our office and any transportation needs.   Information on my medicine - XARELTO (Rivaroxaban)  This medication education was reviewed with me or my healthcare representative as part of my discharge preparation.  The pharmacist that spoke with me during my hospital stay was:  Altha Harm, PharmD  Why was Xarelto prescribed for you? Xarelto was prescribed for you to reduce the risk of blood clots forming after orthopedic surgery. The medical term for these abnormal blood clots is venous thromboembolism (VTE).  What do you need to know about xarelto ? Take your Xarelto ONCE DAILY at the same time every day. You may take it either with or without food.  If you have difficulty swallowing the tablet whole, you may crush it and mix in applesauce just prior to taking your dose.  Take Xarelto exactly as prescribed by your doctor and DO NOT stop taking Xarelto without talking to the doctor who prescribed the medication.  Stopping without other VTE prevention medication to take the place of Xarelto may increase your risk of developing a clot.  After discharge, you should have regular check-up appointments with your healthcare provider that is prescribing your Xarelto.    What do you do if you miss a dose? If you miss a dose, take it as soon as you remember on the same day then continue your regularly scheduled once daily regimen the next day. Do not take two doses of Xarelto on the same day.   Important Safety Information A possible side effect of Xarelto is bleeding. You should call your healthcare  provider right away if you experience any of the following:   Bleeding from an injury or your nose that does not stop.   Unusual colored urine (red or dark brown) or unusual colored stools (red or black).   Unusual bruising for unknown reasons.   A serious fall or if you hit your head (even if there is no bleeding).  Some medicines may interact with Xarelto and might increase your risk of bleeding while on Xarelto. To help avoid this, consult your healthcare provider or pharmacist prior to using any new prescription or non-prescription medications, including herbals, vitamins, non-steroidal anti-inflammatory drugs (NSAIDs) and supplements.  This website has more information on Xarelto: [email or web address]

## 2014-03-04 NOTE — Evaluation (Signed)
Physical Therapy Evaluation Patient Details Name: George [name] MRN: [id number] DOB: [birth year] Today's Date: 03/04/2014   History of Present Illness  Pt is a 73 year old male s/p R TKA.  Clinical Impression  Pt is s/p R TKA resulting in the deficits listed below (see PT Problem List).  Pt will benefit from skilled PT to increase their independence and safety with mobility to allow discharge to the venue listed below.  Pt plans to d/c to SNF.     Follow Up Recommendations SNF    Equipment Recommendations  None recommended by PT    Recommendations for Other Services       Precautions / Restrictions Precautions Precautions: Knee Required Braces or Orthoses: Knee Immobilizer - Right Knee Immobilizer - Right: Discontinue once straight leg raise with < 10 degree lag Restrictions Other Position/Activity Restrictions: WBAT      Mobility  Bed Mobility Overal bed mobility: Needs Assistance Bed Mobility: Supine to Sit     Supine to sit: Min assist     General bed mobility comments: assist for R LE  Transfers Overall transfer level: Needs assistance Equipment used: Rolling walker (2 wheeled) Transfers: Sit to/from Stand Sit to Stand: Min guard         General transfer comment: verbal cues for UE and LE positioning  Ambulation/Gait Ambulation/Gait assistance: Min guard Ambulation Distance (Feet): 120 Feet Assistive device: Rolling walker (2 wheeled) Gait Pattern/deviations: Step-to pattern;Antalgic     General Gait Details: verbal cues for sequence, RW distance, step length, posture  Stairs            Wheelchair Mobility    Modified Rankin (Stroke Patients Only)       Balance                                             Pertinent Vitals/Pain Pain Assessment: 0-10 Pain Score: 2  Pain Location: R knee Pain Descriptors / Indicators: Sore Pain Intervention(s): Limited activity within patient's tolerance;Monitored during  session;Premedicated before session;Repositioned;Ice applied    Home Living Family/patient expects to be discharged to:: Skilled nursing facility Living Arrangements: Alone             Home Equipment: Crutches      Prior Function Level of Independence: Independent               Hand Dominance        Extremity/Trunk Assessment               Lower Extremity Assessment: RLE deficits/detail RLE Deficits / Details: R knee AAROM -5-80*, good quad contraction, unable to perform SLR       Communication   Communication: No difficulties  Cognition Arousal/Alertness: Awake/alert Behavior During Therapy: WFL for tasks assessed/performed Overall Cognitive Status: Within Functional Limits for tasks assessed                      General Comments      Exercises Total Joint Exercises Ankle Circles/Pumps: AROM;Both;10 reps Quad Sets: AROM;Both;10 reps Towel Squeeze: AROM;Both;10 reps Short Arc Quad: AROM;Right;10 reps Heel Slides: AAROM;Seated;Right;10 reps Hip ABduction/ADduction: AROM;Right;10 reps Straight Leg Raises: AAROM;Right;10 reps      Assessment/Plan    PT Assessment Patient needs continued PT services  PT Diagnosis Acute pain;Difficulty walking   PT Problem List Decreased strength;Decreased range of motion;Decreased mobility;Pain;Decreased knowledge of precautions;Decreased  knowledge of use of DME  PT Treatment Interventions Functional mobility training;Patient/family education;Gait training;DME instruction;Therapeutic activities;Therapeutic exercise   PT Goals (Current goals can be found in the Care Plan section) Acute Rehab PT Goals PT Goal Formulation: With patient Time For Goal Achievement: 03/08/14 Potential to Achieve Goals: Good    Frequency 7X/week   Barriers to discharge        Co-evaluation               End of Session Equipment Utilized During Treatment: Gait belt;Right knee immobilizer Activity Tolerance: Patient  tolerated treatment well Patient left: in chair;with call bell/phone within reach;with family/visitor present           Time: 8563-1497 PT Time Calculation (min): 25 min   Charges:   PT Evaluation $Initial PT Evaluation Tier I: 1 Procedure PT Treatments $Gait Training: 8-22 mins $Therapeutic Exercise: 8-22 mins   PT G Codes:          Aedyn Mckeon,KATHrine E 03/04/2014, 12:37 PM Carmelia Bake, PT, DPT 03/04/2014 Pager: [phone]

## 2014-03-04 NOTE — Progress Notes (Signed)
Clinical Social Work Department BRIEF PSYCHOSOCIAL ASSESSMENT 03/04/2014  Patient:  George [name], George [name]     Account Number:  [id number]     [place] date:  03/03/2014  Clinical Social Worker:  Lacie Scotts  Date/Time:  03/04/2014 01:16 PM  Referred by:  Physician  Date Referred:  03/04/2014 Referred for  SNF Placement   Other Referral:   Interview type:  Patient Other interview type:    PSYCHOSOCIAL DATA Living Status:  ALONE Admitted from facility:   Level of care:   Primary support name:  Shellia Cleverly Primary support relationship to patient:  CHILD, ADULT Degree of support available:   supportive    CURRENT CONCERNS Current Concerns  Post-Acute Placement   Other Concerns:    SOCIAL WORK ASSESSMENT / PLAN Pt is a 73 yr old gentleman living at home alone prior to hospitalization. CSW met with pt to assist with d/c planning. This is a planned admission. Pt has made prior arrangements to have ST Rehab at [facility] following hospital d/c. CSW has contacted SNF and d/c plan has been confirmed. CSW will continue to follow to assist with d/c planning to SNF.   Assessment/plan status:  Psychosocial Support/Ongoing Assessment of Needs Other assessment/ plan:   Information/referral to community resources:   Insurance coverage for SNF and ambulance transport reviewed.    PATIENT'S/FAMILY'S RESPONSE TO PLAN OF CARE: Pt's mood is bright . He had a fairly good night following surgery. Pt is motivated is begin therapy. He is looking forward to having his rehab at [facility].     Werner Lean LCSW [phone]

## 2014-03-04 NOTE — Progress Notes (Signed)
Noted in chart that pt to go to [facility] after d/c from hospital.  Will defer OT evaluation to [facility] [address] [place], [place] [phone]

## 2014-03-04 NOTE — Progress Notes (Signed)
   Subjective: 1 Day Post-Op Procedure(s) (LRB): RIGHT TOTAL KNEE ARTHROPLASTY (Right) Patient reports pain as mild.   Patient seen in rounds for Dr. Wynelle Link. Patient is well, but has had some minor complaints of pain in the knee, requiring pain medications We will start therapy today.  Plan is to go [facility] after hospital stay.  Objective: Vital signs in last 24 hours: Temp:  [97.6 F (36.4 C)-99 F (37.2 C)] 97.9 F (36.6 C) (09/22 0515) Pulse Rate:  [75-97] 86 (09/22 0515) Resp:  [9-20] 20 (09/22 0515) BP: (120-139)/(54-78) 121/67 mmHg (09/22 0515) SpO2:  [96 %-100 %] 99 % (09/22 0515) Weight:  [83.008 kg (183 lb)] 83.008 kg (183 lb) (09/21 1414)  Intake/Output from previous day:  Intake/Output Summary (Last 24 hours) at 03/04/14 0846 Last data filed at 03/04/14 0552  Gross per 24 hour  Intake 3527.11 ml  Output   2960 ml  Net 567.11 ml    Intake/Output this shift: UOP 1160 since MN  Labs:  Recent Labs  03/04/14 0420  HGB 12.2*    Recent Labs  03/04/14 0420  WBC 11.2*  RBC 3.88*  HCT 34.4*  PLT 202    Recent Labs  03/04/14 0420  NA 140  K 4.0  CL 107  CO2 24  BUN 13  CREATININE 0.84  GLUCOSE 189*  CALCIUM 8.2*   No results found for this basename: LABPT, INR,  in the last 72 hours  EXAM General - Patient is Alert, Appropriate and Oriented Extremity - Neurovascular intact Sensation intact distally Dorsiflexion/Plantar flexion intact Dressing - dressing C/D/I Motor Function - intact, moving foot and toes well on exam.  Hemovac pulled without difficulty.  Past Medical History  Diagnosis Date  . Arthritis   . 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 Small bowel obstruction     Assessment/Plan: 1 Day Post-Op Procedure(s) (LRB): RIGHT TOTAL KNEE ARTHROPLASTY (Right) Principal Problem:   OA (osteoarthritis) of knee  Estimated body mass index is 27.42 kg/(m^2) as  calculated from the following:   Height as of this encounter: 5' 8.5" (1.74 m).   Weight as of this encounter: 83.008 kg (183 lb). Advance diet Up with therapy Discharge to SNF - [facility]  DVT Prophylaxis - Xarelto Weight-Bearing as tolerated to right leg D/C O2 and Pulse OX and try on Room Air  Arlee Muslim, PA-C Orthopaedic Surgery 03/04/2014, 8:46 AM

## 2014-03-04 NOTE — Progress Notes (Signed)
Physical Therapy Treatment Note   03/04/14 1500  PT Visit Information  Last PT Received On 03/04/14  Assistance Needed +1  History of Present Illness Pt is a 73 year old male s/p R TKA.  PT Time Calculation  PT Start Time 1437  PT Stop Time 1451  PT Time Calculation (min) 14 min  Subjective Data  Subjective Pt ambulated in hallway again this afternoon then assisted back to bed  Precautions  Precautions Knee  Required Braces or Orthoses Knee Immobilizer - Right  Knee Immobilizer - Right Discontinue once straight leg raise with < 10 degree lag  Restrictions  Other Position/Activity Restrictions WBAT  Pain Assessment  Pain Assessment 0-10  Pain Score 3  Pain Location R knee  Pain Descriptors / Indicators Sore (stiff)  Pain Intervention(s) Limited activity within patient's tolerance;Monitored during session;Repositioned;Patient requesting pain meds-RN notified  Cognition  Arousal/Alertness Awake/alert  Behavior During Therapy WFL for tasks assessed/performed  Overall Cognitive Status Within Functional Limits for tasks assessed  Bed Mobility  Overal bed mobility Needs Assistance  Bed Mobility Sit to Supine  Sit to supine Supervision  Transfers  Overall transfer level Needs assistance  Equipment used Rolling walker (2 wheeled)  Transfers Sit to/from Stand  Sit to Stand Min guard  General transfer comment verbal cues for UE and LE positioning  Ambulation/Gait  Ambulation/Gait assistance Min guard  Ambulation Distance (Feet) 160 Feet  Assistive device Rolling walker (2 wheeled)  Gait Pattern/deviations Step-to pattern;Antalgic;Step-through pattern  General Gait Details verbal cues for sequence, RW distance, step length, posture  PT - End of Session  Activity Tolerance Patient tolerated treatment well  Patient left in bed;with call bell/phone within reach  Nurse Communication Patient requests pain meds  PT - Assessment/Plan  PT Plan Current plan remains appropriate  PT  Frequency 7X/week  Follow Up Recommendations SNF  PT equipment None recommended by PT  PT Goal Progression  Progress towards PT goals Progressing toward goals  PT General Charges  $$ ACUTE PT VISIT 1 Procedure  PT Treatments  $Gait Training 8-22 mins   Carmelia Bake, PT, DPT 03/04/2014 Pager: [phone]

## 2014-03-05 DIAGNOSIS — N400 Enlarged prostate without lower urinary tract symptoms: Secondary | ICD-10-CM | POA: Diagnosis not present

## 2014-03-05 DIAGNOSIS — D649 Anemia, unspecified: Secondary | ICD-10-CM | POA: Diagnosis not present

## 2014-03-05 DIAGNOSIS — M1710 Unilateral primary osteoarthritis, unspecified knee: Secondary | ICD-10-CM | POA: Diagnosis not present

## 2014-03-05 DIAGNOSIS — R279 Unspecified lack of coordination: Secondary | ICD-10-CM | POA: Diagnosis not present

## 2014-03-05 DIAGNOSIS — R269 Unspecified abnormalities of gait and mobility: Secondary | ICD-10-CM | POA: Diagnosis not present

## 2014-03-05 DIAGNOSIS — Z96659 Presence of unspecified artificial knee joint: Secondary | ICD-10-CM | POA: Diagnosis not present

## 2014-03-05 DIAGNOSIS — F329 Major depressive disorder, single episode, unspecified: Secondary | ICD-10-CM | POA: Diagnosis not present

## 2014-03-05 DIAGNOSIS — M6281 Muscle weakness (generalized): Secondary | ICD-10-CM | POA: Diagnosis not present

## 2014-03-05 DIAGNOSIS — F3289 Other specified depressive episodes: Secondary | ICD-10-CM | POA: Diagnosis not present

## 2014-03-05 DIAGNOSIS — D62 Acute posthemorrhagic anemia: Secondary | ICD-10-CM | POA: Diagnosis not present

## 2014-03-05 DIAGNOSIS — M1990 Unspecified osteoarthritis, unspecified site: Secondary | ICD-10-CM | POA: Diagnosis not present

## 2014-03-05 DIAGNOSIS — Z471 Aftercare following joint replacement surgery: Secondary | ICD-10-CM | POA: Diagnosis not present

## 2014-03-05 LAB — BASIC METABOLIC PANEL
Anion gap: 12 (ref 5–15)
BUN: 14 mg/dL (ref 6–23)
CHLORIDE: 104 meq/L (ref 96–112)
CO2: 25 mEq/L (ref 19–32)
CREATININE: 0.87 mg/dL (ref 0.50–1.35)
Calcium: 8.5 mg/dL (ref 8.4–10.5)
GFR calc non Af Amer: 85 mL/min — ABNORMAL LOW (ref >90)
Glucose, Bld: 103 mg/dL — ABNORMAL HIGH (ref 70–99)
Potassium: 4 mEq/L (ref 3.7–5.3)
Sodium: 141 mEq/L (ref 137–147)

## 2014-03-05 LAB — CBC
HEMATOCRIT: 35.6 % — AB (ref 39.0–52.0)
Hemoglobin: 12.4 g/dL — ABNORMAL LOW (ref 13.0–17.0)
MCH: 31.5 pg (ref 26.0–34.0)
MCHC: 34.8 g/dL (ref 30.0–36.0)
MCV: 90.4 fL (ref 78.0–100.0)
Platelets: 189 10*3/uL (ref 150–400)
RBC: 3.94 MIL/uL — ABNORMAL LOW (ref 4.22–5.81)
RDW: 12.3 % (ref 11.5–15.5)
WBC: 12.1 10*3/uL — AB (ref 4.0–10.5)

## 2014-03-05 MED ORDER — DSS 100 MG PO CAPS
100.0000 mg | ORAL_CAPSULE | Freq: Two times a day (BID) | ORAL | Status: DC
Start: 2014-03-05 — End: 2014-06-30

## 2014-03-05 MED ORDER — BISACODYL 10 MG RE SUPP: 10.0000 mg | Freq: Every day | RECTAL | Status: DC | PRN

## 2014-03-05 MED ORDER — METHOCARBAMOL 500 MG PO TABS: 500.0000 mg | ORAL_TABLET | Freq: Four times a day (QID) | ORAL | Status: DC | PRN

## 2014-03-05 MED ORDER — RIVAROXABAN 10 MG PO TABS: 10.0000 mg | ORAL_TABLET | Freq: Every day | ORAL | Status: DC

## 2014-03-05 MED ORDER — ACETAMINOPHEN 325 MG PO TABS: 650.0000 mg | ORAL_TABLET | Freq: Four times a day (QID) | ORAL | Status: DC | PRN

## 2014-03-05 MED ORDER — OXYCODONE HCL 5 MG PO TABS: 5.0000 mg | ORAL_TABLET | ORAL | Status: DC | PRN

## 2014-03-05 MED ORDER — ONDANSETRON HCL 4 MG PO TABS: 4.0000 mg | ORAL_TABLET | Freq: Four times a day (QID) | ORAL | Status: DC | PRN

## 2014-03-05 MED ORDER — POLYETHYLENE GLYCOL 3350 17 G PO PACK: 17.0000 g | PACK | Freq: Every day | ORAL | Status: DC | PRN

## 2014-03-05 MED ORDER — METOCLOPRAMIDE HCL 5 MG PO TABS: 5.0000 mg | ORAL_TABLET | Freq: Three times a day (TID) | ORAL | Status: DC | PRN

## 2014-03-05 NOTE — Progress Notes (Addendum)
Physical Therapy Treatment Patient Details Name: George [name] MRN: [id number] DOB: 23-Jan-1941 Today's Date: 03/05/2014    History of Present Illness Pt is a 73 year old male s/p R TKA.    PT Comments    Pt ambulated in hallway and then performed LE exercises.  Pt plans to d/c to SNF later today.  Follow Up Recommendations  SNF     Equipment Recommendations  None recommended by PT    Recommendations for Other Services       Precautions / Restrictions Precautions Precautions: Knee Precaution Comments: pt able to perform SLR Required Braces or Orthoses: Knee Immobilizer - Right Knee Immobilizer - Right: Discontinue once straight leg raise with < 10 degree lag Restrictions Other Position/Activity Restrictions: WBAT    Mobility  Bed Mobility Overal bed mobility: Needs Assistance Bed Mobility: Supine to Sit     Supine to sit: Min assist     General bed mobility comments: assist for R LE  Transfers Overall transfer level: Needs assistance Equipment used: Rolling walker (2 wheeled) Transfers: Sit to/from Stand Sit to Stand: Min guard         General transfer comment: verbal cues for UE and LE positioning, to wait for RW for safety  Ambulation/Gait Ambulation/Gait assistance: Min guard Ambulation Distance (Feet): 200 Feet Assistive device: Rolling walker (2 wheeled) Gait Pattern/deviations: Step-to pattern;Antalgic     General Gait Details: verbal cues for sequence, RW distance, step length, posture   Stairs            Wheelchair Mobility    Modified Rankin (Stroke Patients Only)       Balance                                    Cognition Arousal/Alertness: Awake/alert Behavior During Therapy: WFL for tasks assessed/performed Overall Cognitive Status: Within Functional Limits for tasks assessed                      Exercises Total Joint Exercises Ankle Circles/Pumps: AROM;Both;15 reps Quad Sets:  AROM;Both;15 reps Towel Squeeze: AROM;Both;15 reps Short Arc Quad: Right;AAROM;15 reps Heel Slides: AAROM;Seated;Right;15 reps Hip ABduction/ADduction: AROM;Right;15 reps Straight Leg Raises: AAROM;Right;10 reps    General Comments        Pertinent Vitals/Pain Pain Assessment: 0-10 Pain Score: 7  Pain Location: R knee Pain Descriptors / Indicators: Sore Pain Intervention(s): Limited activity within patient's tolerance;Monitored during session;Repositioned;Ice applied;RN gave pain meds during session    Home Living                      Prior Function            PT Goals (current goals can now be found in the care plan section) Progress towards PT goals: Progressing toward goals    Frequency  7X/week    PT Plan Current plan remains appropriate    Co-evaluation             End of Session Equipment Utilized During Treatment: Gait belt;Right knee immobilizer Activity Tolerance: Patient tolerated treatment well Patient left: with call bell/phone within reach;in chair     Time: 0017-4944 PT Time Calculation (min): 26 min  Charges:  $Gait Training: 8-22 mins $Therapeutic Exercise: 8-22 mins                    G Codes:  Jaimere Feutz,KATHrine E 03/05/2014, 1:00 PM Carmelia Bake, PT, DPT 03/05/2014 Pager: [phone]

## 2014-03-05 NOTE — Progress Notes (Signed)
   Subjective: 2 Days Post-Op Procedure(s) (LRB): RIGHT TOTAL KNEE ARTHROPLASTY (Right) Patient reports pain as mild.   Patient seen in rounds by Dr. Wynelle Link. Patient is well, and has had no acute complaints or problems Patient is ready to go [facility]  Objective: Vital signs in last 24 hours: Temp:  [98 F (36.7 C)-99 F (37.2 C)] 98 F (36.7 C) (09/23 0517) Pulse Rate:  [82-85] 82 (09/23 0517) Resp:  [16-18] 16 (09/23 0517) BP: (131-142)/(71-78) 131/78 mmHg (09/23 0517) SpO2:  [96 %-97 %] 97 % (09/23 0517)  Intake/Output from previous day:  Intake/Output Summary (Last 24 hours) at 03/05/14 1330 Last data filed at 03/05/14 0900  Gross per 24 hour  Intake 1133.33 ml  Output   1300 ml  Net -166.67 ml    Intake/Output this shift: Total I/O In: 240 [P.O.:240] Out: -   Labs:  Recent Labs  03/04/14 0420 03/05/14 0449  HGB 12.2* 12.4*    Recent Labs  03/04/14 0420 03/05/14 0449  WBC 11.2* 12.1*  RBC 3.88* 3.94*  HCT 34.4* 35.6*  PLT 202 189    Recent Labs  03/04/14 0420 03/05/14 0449  NA 140 141  K 4.0 4.0  CL 107 104  CO2 24 25  BUN 13 14  CREATININE 0.84 0.87  GLUCOSE 189* 103*  CALCIUM 8.2* 8.5   No results found for this basename: LABPT, INR,  in the last 72 hours  EXAM: General - Patient is Alert, Appropriate and Oriented Extremity - Neurovascular intact Sensation intact distally Dorsiflexion/Plantar flexion intact Incision - clean, dry, no drainage Motor Function - intact, moving foot and toes well on exam.   Assessment/Plan: 2 Days Post-Op Procedure(s) (LRB): RIGHT TOTAL KNEE ARTHROPLASTY (Right) Procedure(s) (LRB): RIGHT TOTAL KNEE ARTHROPLASTY (Right) Past Medical History  Diagnosis Date  . Arthritis   . 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 Small bowel obstruction    Principal Problem:   OA (osteoarthritis) of knee  Estimated body mass index is 27.42  kg/(m^2) as calculated from the following:   Height as of this encounter: 5' 8.5" (1.74 m).   Weight as of this encounter: 83.008 kg (183 lb). Up with therapy Discharge to SNF - [facility] - Regular diet Follow up - in 1 week on next Thursday October 1st Activity - WBAT Disposition - [facility] Condition Upon Discharge - Good D/C Meds - See DC Summary DVT Prophylaxis - Xarelto  Arlee Muslim, PA-C Orthopaedic Surgery 03/05/2014, 1:30 PM

## 2014-03-05 NOTE — Progress Notes (Signed)
Clinical Social Work Department CLINICAL SOCIAL WORK PLACEMENT NOTE 03/05/2014  Patient:  George [name], George [name]  Account Number:  [id number] [place] date:  03/03/2014  Clinical Social Worker:  Werner Lean, LCSW  Date/time:  03/05/2014 04:32 PM  Clinical Social Work is seeking post-discharge placement for this patient at the following level of care:   SKILLED NURSING   (*CSW will update this form in Epic as items are completed)     Patient/family provided with [facility] Department of Clinical Social Work's list of facilities offering this level of care within the geographic area requested by the patient (or if unable, by the patient's family).  03/04/2014  Patient/family informed of their freedom to choose among providers that offer the needed level of care, that participate in Medicare, Medicaid or managed care program needed by the patient, have an available bed and are willing to accept the patient.    Patient/family informed of [facility]' ownership interest in [facility], as well as of the fact that they are under no obligation to receive care at this facility.  PASARR submitted to EDS on 03/03/2014 PASARR number received on 03/03/2014  FL2 transmitted to all facilities in geographic area requested by pt/family on  03/04/2014 FL2 transmitted to all facilities within larger geographic area on   Patient informed that his/her managed care company has contracts with or will negotiate with  certain facilities, including the following:     Patient/family informed of bed offers received:  03/04/2014 Patient chooses bed at  Physician recommends and patient chooses bed at  [facility]  Patient to be transferred to [place] on  03/05/2014 Patient to be transferred to facility by FAMILY Patient and family notified of transfer on 03/05/2014 Name of family member notified:  DAUGHTER  The following physician request were entered in Epic:   Additional  Comments: Pt/Daughter are in agreement with d/c to SNF today. Pt is able to transport by car. NSG reviewed d/c summary, scripts, avs. Scripts included in d/c packet.  Werner Lean LCSW [phone]

## 2014-03-05 NOTE — Discharge Summary (Signed)
Physician Discharge Summary   Patient ID: George [name] MRN: [id number] DOB/AGE: [birth year] 71 y.o.  Admit date: 03/03/2014 Discharge date: 03/05/2014  Primary Diagnosis:  Osteoarthritis Right knee(s)  Admission Diagnoses:  Past Medical History  Diagnosis Date  . Arthritis   . 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 Small bowel obstruction    Discharge Diagnoses:   Principal Problem:   OA (osteoarthritis) of knee  Estimated body mass index is 27.42 kg/(m^2) as calculated from the following:   Height as of this encounter: 5' 8.5" (1.74 m).   Weight as of this encounter: 83.008 kg (183 lb).  Procedure:  Procedure(s) (LRB): RIGHT TOTAL KNEE ARTHROPLASTY (Right)   Consults: None  HPI: George [name] is a 73 y.o. year old male with end stage OA of his right knee with progressively worsening pain and dysfunction. He has constant pain, with activity and at rest and significant functional deficits with difficulties even with ADLs. He has had extensive non-op management including analgesics, injections of cortisone, and home exercise program, but remains in significant pain with significant dysfunction. Radiographs show bone on bone arthritis medial and patellofemoral. He presents now for right Total Knee Arthroplasty.   Laboratory Data: Admission on 03/03/2014  Component Date Value Ref Range Status  . WBC 03/04/2014 11.2* 4.0 - 10.5 K/uL Final  . RBC 03/04/2014 3.88* 4.22 - 5.81 MIL/uL Final  . Hemoglobin 03/04/2014 12.2* 13.0 - 17.0 g/dL Final  . HCT 03/04/2014 34.4* 39.0 - 52.0 % Final  . MCV 03/04/2014 88.7  78.0 - 100.0 fL Final  . MCH 03/04/2014 31.4  26.0 - 34.0 pg Final  . MCHC 03/04/2014 35.5  30.0 - 36.0 g/dL Final  . RDW 03/04/2014 12.1  11.5 - 15.5 % Final  . Platelets 03/04/2014 202  150 - 400 K/uL Final  . Sodium 03/04/2014 140  137 - 147 mEq/L Final  . Potassium 03/04/2014 4.0  3.7 - 5.3  mEq/L Final  . Chloride 03/04/2014 107  96 - 112 mEq/L Final  . CO2 03/04/2014 24  19 - 32 mEq/L Final  . Glucose, Bld 03/04/2014 189* 70 - 99 mg/dL Final  . BUN 03/04/2014 13  6 - 23 mg/dL Final  . Creatinine, Ser 03/04/2014 0.84  0.50 - 1.35 mg/dL Final  . Calcium 03/04/2014 8.2* 8.4 - 10.5 mg/dL Final  . GFR calc non Af Amer 03/04/2014 87* >90 mL/min Final  . GFR calc Af Amer 03/04/2014 >90  >90 mL/min Final   Comment: (NOTE)                          The eGFR has been calculated using the CKD EPI equation.                          This calculation has not been validated in all clinical situations.                          eGFR's persistently <90 mL/min signify possible Chronic Kidney                          Disease.  . Anion gap 03/04/2014 9  5 - 15 Final  . WBC 03/05/2014 12.1* 4.0 - 10.5 K/uL Final  . RBC 03/05/2014 3.94*  4.22 - 5.81 MIL/uL Final  . Hemoglobin 03/05/2014 12.4* 13.0 - 17.0 g/dL Final  . HCT 03/05/2014 35.6* 39.0 - 52.0 % Final  . MCV 03/05/2014 90.4  78.0 - 100.0 fL Final  . MCH 03/05/2014 31.5  26.0 - 34.0 pg Final  . MCHC 03/05/2014 34.8  30.0 - 36.0 g/dL Final  . RDW 03/05/2014 12.3  11.5 - 15.5 % Final  . Platelets 03/05/2014 189  150 - 400 K/uL Final  . Sodium 03/05/2014 141  137 - 147 mEq/L Final  . Potassium 03/05/2014 4.0  3.7 - 5.3 mEq/L Final  . Chloride 03/05/2014 104  96 - 112 mEq/L Final  . CO2 03/05/2014 25  19 - 32 mEq/L Final  . Glucose, Bld 03/05/2014 103* 70 - 99 mg/dL Final  . BUN 03/05/2014 14  6 - 23 mg/dL Final  . Creatinine, Ser 03/05/2014 0.87  0.50 - 1.35 mg/dL Final  . Calcium 03/05/2014 8.5  8.4 - 10.5 mg/dL Final  . GFR calc non Af Amer 03/05/2014 85* >90 mL/min Final  . GFR calc Af Amer 03/05/2014 >90  >90 mL/min Final   Comment: (NOTE)                          The eGFR has been calculated using the CKD EPI equation.                          This calculation has not been validated in all clinical situations.                           eGFR's persistently <90 mL/min signify possible Chronic Kidney                          Disease.  Georgiann Hahn gap 03/05/2014 12  5 - 15 Final  Hospital Outpatient Visit on 02/26/2014  Component Date Value Ref Range Status  . aPTT 02/26/2014 28  24 - 37 seconds Final  . WBC 02/26/2014 7.0  4.0 - 10.5 K/uL Final  . RBC 02/26/2014 4.75  4.22 - 5.81 MIL/uL Final  . Hemoglobin 02/26/2014 14.7  13.0 - 17.0 g/dL Final  . HCT 02/26/2014 42.7  39.0 - 52.0 % Final  . MCV 02/26/2014 89.9  78.0 - 100.0 fL Final  . MCH 02/26/2014 30.9  26.0 - 34.0 pg Final  . MCHC 02/26/2014 34.4  30.0 - 36.0 g/dL Final  . RDW 02/26/2014 12.1  11.5 - 15.5 % Final  . Platelets 02/26/2014 203  150 - 400 K/uL Final  . Sodium 02/26/2014 140  137 - 147 mEq/L Final  . Potassium 02/26/2014 4.0  3.7 - 5.3 mEq/L Final  . Chloride 02/26/2014 104  96 - 112 mEq/L Final  . CO2 02/26/2014 25  19 - 32 mEq/L Final  . Glucose, Bld 02/26/2014 92  70 - 99 mg/dL Final  . BUN 02/26/2014 18  6 - 23 mg/dL Final  . Creatinine, Ser 02/26/2014 0.95  0.50 - 1.35 mg/dL Final  . Calcium 02/26/2014 9.2  8.4 - 10.5 mg/dL Final  . Total Protein 02/26/2014 6.7  6.0 - 8.3 g/dL Final  . Albumin 02/26/2014 3.6  3.5 - 5.2 g/dL Final  . AST 02/26/2014 24  0 - 37 U/L Final  . ALT 02/26/2014 24  0 - 53 U/L Final  . Alkaline Phosphatase  02/26/2014 75  39 - 117 U/L Final  . Total Bilirubin 02/26/2014 0.5  0.3 - 1.2 mg/dL Final  . GFR calc non Af Amer 02/26/2014 82* >90 mL/min Final  . GFR calc Af Amer 02/26/2014 >90  >90 mL/min Final   Comment: (NOTE)                          The eGFR has been calculated using the CKD EPI equation.                          This calculation has not been validated in all clinical situations.                          eGFR's persistently <90 mL/min signify possible Chronic Kidney                          Disease.  . Anion gap 02/26/2014 11  5 - 15 Final  . Prothrombin Time 02/26/2014 13.1  11.6 - 15.2 seconds Final  .  INR 02/26/2014 0.99  0.00 - 1.49 Final  . ABO/RH(D) 02/26/2014 O POS   Final  . Antibody Screen 02/26/2014 NEG   Final  . Sample Expiration 02/26/2014 03/06/2014   Final  . Color, Urine 02/26/2014 YELLOW  YELLOW Final  . APPearance 02/26/2014 CLEAR  CLEAR Final  . Specific Gravity, Urine 02/26/2014 1.017  1.005 - 1.030 Final  . pH 02/26/2014 7.5  5.0 - 8.0 Final  . Glucose, UA 02/26/2014 NEGATIVE  NEGATIVE mg/dL Final  . Hgb urine dipstick 02/26/2014 NEGATIVE  NEGATIVE Final  . Bilirubin Urine 02/26/2014 NEGATIVE  NEGATIVE Final  . Ketones, ur 02/26/2014 NEGATIVE  NEGATIVE mg/dL Final  . Protein, ur 02/26/2014 NEGATIVE  NEGATIVE mg/dL Final  . Urobilinogen, UA 02/26/2014 0.2  0.0 - 1.0 mg/dL Final  . Nitrite 02/26/2014 NEGATIVE  NEGATIVE Final  . Leukocytes, UA 02/26/2014 NEGATIVE  NEGATIVE Final   MICROSCOPIC NOT DONE ON URINES WITH NEGATIVE PROTEIN, BLOOD, LEUKOCYTES, NITRITE, OR GLUCOSE <1000 mg/dL.  Marland Kitchen MRSA, PCR 02/26/2014 NEGATIVE  NEGATIVE Final  . Staphylococcus aureus 02/26/2014 NEGATIVE  NEGATIVE Final   Comment:                                 The Xpert SA Assay (FDA                          approved for NASAL specimens                          in patients over 78 years of age),                          is one component of                          a comprehensive surveillance                          program.  Test performance has  been validated by [facility] for patients greater                          than or equal to 57 year old.                          It is not intended                          to diagnose infection nor to                          guide or monitor treatment.  . ABO/RH(D) 02/26/2014 O POS   Final     X-Rays:No results found.  EKG:No orders found for this or any previous visit.   Hospital Course: Rhea Thrun is a 73 y.o. who was admitted to [facility]. They were brought to the  operating room on 03/03/2014 and underwent Procedure(s): RIGHT TOTAL KNEE ARTHROPLASTY.  Patient tolerated the procedure well and was later transferred to the recovery room and then to the orthopaedic floor for postoperative care.  They were given PO and IV analgesics for pain control following their surgery.  They were given 24 hours of postoperative antibiotics of  Anti-infectives   Start     Dose/Rate Route Frequency Ordered Stop   03/03/14 1800  ceFAZolin (ANCEF) IVPB 2 g/50 mL premix     2 g 100 mL/hr over 30 Minutes Intravenous Every 6 hours 03/03/14 1420 03/04/14 0021   03/03/14 0900  ceFAZolin (ANCEF) IVPB 2 g/50 mL premix     2 g 100 mL/hr over 30 Minutes Intravenous On call to O.R. 03/03/14 9379 03/03/14 1131   03/03/14 0834  ceFAZolin (ANCEF) IVPB 2 g/50 mL premix  Status:  Discontinued     2 g 100 mL/hr over 30 Minutes Intravenous On call to O.R. 03/03/14 0240 03/03/14 0843     and started on DVT prophylaxis in the form of Xarelto.   PT and OT were ordered for total joint protocol.  Discharge planning consulted to help with postop disposition and equipment needs.  Social worker became involved to assist with transfer to SNF.  Patient had a good night on the evening of surgery.  They started to get up OOB with therapy on day one. Hemovac drain was pulled without difficulty.  Continued to work with therapy into day two.  Dressing was changed on day two and the incision was healing well. Patient was seen in rounds and was ready to go to [facility] following therapy.  Discharge to SNF - [facility]  Diet - Regular diet  Follow up - in 1 week on next Thursday October 1st  Activity - WBAT  Disposition - [facility]  Condition Upon Discharge - Good  D/C Meds - See DC Summary  DVT Prophylaxis - Xarelto  Discharge Instructions   Call MD / Call 911    Complete by:  As directed   If you experience chest pain or shortness of breath, CALL 911 and be transported to the hospital emergency  room.  If you develope a fever above 101 F, pus (white drainage) or increased drainage or redness at the wound, or  calf pain, call your surgeon's office.     Change dressing    Complete by:  As directed   Change dressing daily with sterile 4 x 4 inch gauze dressing and apply TED hose. Do not submerge the incision under water.     Constipation Prevention    Complete by:  As directed   Drink plenty of fluids.  Prune juice may be helpful.  You may use a stool softener, such as Colace (over the counter) 100 mg twice a day.  Use MiraLax (over the counter) for constipation as needed.     Diet - low sodium heart healthy    Complete by:  As directed      Discharge instructions    Complete by:  As directed   Pick up stool softner and laxative for home. Do not submerge incision under water. May shower. Continue to use ice for pain and swelling from surgery.  Take Xarelto for two and a half more weeks, then discontinue Xarelto. Once the patient has completed the Xarelto, they may resume the 81 mg Aspirin.  When discharged from the skilled rehab facility, please have the facility set up the patient's [place] prior to being released.  Also provide the patient with their medications at time of release from the facility to include their pain medication, the muscle relaxants, and their blood thinner medication.  If the patient is still at the rehab facility at time of follow up appointment, please also assist the patient in arranging follow up appointment in our office and any transportation needs.  Needs to have follow up appointment set up for Thursday October 1st.  Call office for appointment time at [phone].     Do not put a pillow under the knee. Place it under the heel.    Complete by:  As directed      Do not sit on low chairs, stoools or toilet seats, as it may be difficult to get up from low surfaces    Complete by:  As directed      Driving restrictions    Complete by:  As  directed   No driving until released by the physician.     Increase activity slowly as tolerated    Complete by:  As directed      Lifting restrictions    Complete by:  As directed   No lifting until released by the physician.     Patient may shower    Complete by:  As directed   You may shower without a dressing once there is no drainage.  Do not wash over the wound.  If drainage remains, do not shower until drainage stops.     TED hose    Complete by:  As directed   Use stockings (TED hose) for 3 weeks on both leg(s).  You may remove them at night for sleeping.     Weight bearing as tolerated    Complete by:  As directed             Medication List    STOP taking these medications       aspirin EC 81 MG tablet     celecoxib 200 MG capsule  Commonly known as:  CELEBREX     Melatonin 1 MG Caps     multivitamin with minerals Tabs tablet      TAKE these medications       acetaminophen 325 MG tablet  Commonly known as:  TYLENOL  Take 2 tablets (650 mg total) by mouth every 6 (six) hours as needed for mild pain (or Fever >/= 101).     bisacodyl 10 MG suppository  Commonly known as:  DULCOLAX  Place 1 suppository (10 mg total) rectally daily as needed for moderate constipation.     buPROPion 150 MG 24 hr tablet  Commonly known as:  WELLBUTRIN XL  Take 450 mg by mouth every morning.     citalopram 20 MG tablet  Commonly known as:  CELEXA  Take 20 mg by mouth at bedtime.     doxazosin 8 MG tablet  Commonly known as:  CARDURA  Take 8 mg by mouth at bedtime.     DSS 100 MG Caps  Take 100 mg by mouth 2 (two) times daily.     finasteride 5 MG tablet  Commonly known as:  PROSCAR  Take 5 mg by mouth every morning.     methocarbamol 500 MG tablet  Commonly known as:  ROBAXIN  Take 1 tablet (500 mg total) by mouth every 6 (six) hours as needed for muscle spasms.     metoCLOPramide 5 MG tablet  Commonly known as:  REGLAN  Take 1-2 tablets (5-10 mg total) by mouth  every 8 (eight) hours as needed for nausea (if ondansetron (ZOFRAN) ineffective.).     ondansetron 4 MG tablet  Commonly known as:  ZOFRAN  Take 1 tablet (4 mg total) by mouth every 6 (six) hours as needed for nausea.     oxyCODONE 5 MG immediate release tablet  Commonly known as:  Oxy IR/ROXICODONE  Take 1-2 tablets (5-10 mg total) by mouth every 3 (three) hours as needed for moderate pain, severe pain or breakthrough pain.     polyethylene glycol packet  Commonly known as:  MIRALAX / GLYCOLAX  Take 17 g by mouth daily as needed for mild constipation.     rivaroxaban 10 MG Tabs tablet  Commonly known as:  XARELTO  - Take 1 tablet (10 mg total) by mouth daily with breakfast. Take Xarelto for two and a half more weeks, then discontinue Xarelto.  - Once the patient has completed the Xarelto, they may resume the 81 mg Aspirin.           Follow-up Information   Follow up with Gearlean Alf, MD. Schedule an appointment as soon as possible for a visit on 03/13/2014. (Call [phone] tomorrow to make the appointment)    Specialty:  Orthopedic Surgery   Contact information:   [address] [place] [address] [phone]       Signed: Arlee Muslim, PA-C Orthopaedic Surgery 03/05/2014, 1:42 PM

## 2014-03-06 ENCOUNTER — Non-Acute Institutional Stay (SKILLED_NURSING_FACILITY): Payer: Medicare Other | Admitting: Internal Medicine

## 2014-03-06 DIAGNOSIS — N400 Enlarged prostate without lower urinary tract symptoms: Secondary | ICD-10-CM | POA: Diagnosis not present

## 2014-03-06 DIAGNOSIS — M1710 Unilateral primary osteoarthritis, unspecified knee: Secondary | ICD-10-CM | POA: Diagnosis not present

## 2014-03-06 DIAGNOSIS — F329 Major depressive disorder, single episode, unspecified: Secondary | ICD-10-CM

## 2014-03-06 DIAGNOSIS — M1711 Unilateral primary osteoarthritis, right knee: Secondary | ICD-10-CM

## 2014-03-06 DIAGNOSIS — F3289 Other specified depressive episodes: Secondary | ICD-10-CM | POA: Diagnosis not present

## 2014-03-06 DIAGNOSIS — D62 Acute posthemorrhagic anemia: Secondary | ICD-10-CM | POA: Diagnosis not present

## 2014-03-07 DIAGNOSIS — N400 Enlarged prostate without lower urinary tract symptoms: Secondary | ICD-10-CM | POA: Insufficient documentation

## 2014-03-07 DIAGNOSIS — D62 Acute posthemorrhagic anemia: Secondary | ICD-10-CM | POA: Insufficient documentation

## 2014-03-07 DIAGNOSIS — F329 Major depressive disorder, single episode, unspecified: Secondary | ICD-10-CM | POA: Insufficient documentation

## 2014-03-07 DIAGNOSIS — F32A Depression, unspecified: Secondary | ICD-10-CM | POA: Insufficient documentation

## 2014-03-07 NOTE — Progress Notes (Signed)
HISTORY & PHYSICAL  DATE: 03/06/2014   FACILITY: [facility]  LEVEL OF CARE: SNF (31)  ALLERGIES:  No Known Allergies  CHIEF COMPLAINT:  Manage right knee osteoarthritis, BPH and depression  HISTORY OF PRESENT ILLNESS: Patient is a 73 year old Caucasian male.  KNEE OSTEOARTHRITIS: Patient had a history of pain and functional disability in the knee due to end-stage osteoarthritis and has failed nonsurgical conservative treatments. Patient had worsening of pain with activity and weight bearing, pain that interfered with activities of daily living & pain with passive range of motion. Therefore patient underwent total knee arthroplasty and tolerated the procedure well. Patient is admitted to this facility for sort short-term rehabilitation. Patient denies knee pain.  BPH: The patient's BPH remains stable. Patient denies urinary hesitancy or dribbling. No complications reported from the current medications being used.  DEPRESSION: The depression remains stable. Patient denies ongoing feelings of sadness, insomnia, anedhonia or lack of appetite. No complications reported from the medications currently being used. Staff do not report behavioral problems.  PAST MEDICAL HISTORY :  Past Medical History  Diagnosis Date  . Arthritis   . 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 Small bowel obstruction     PAST SURGICAL HISTORY: Past Surgical History  Procedure Laterality Date  . Hemicolectomy    . Skin cancer removed from nose     . Joint replacement      left knee   . Total knee arthroplasty Right 03/03/2014    Procedure: RIGHT TOTAL KNEE ARTHROPLASTY;  Surgeon: Gearlean Alf, MD;  Location: [facility];  Service: Orthopedics;  Laterality: Right;    SOCIAL HISTORY:  reports that he has quit smoking. He has never used smokeless tobacco. He reports that he drinks alcohol. He reports that he does not use  illicit drugs.  FAMILY HISTORY: None  CURRENT MEDICATIONS: Reviewed per MAR/see medication list  REVIEW OF SYSTEMS:  See HPI otherwise 14 point ROS is negative.  PHYSICAL EXAMINATION  VS:  See VS section  GENERAL: no acute distress, normal body habitus EYES: conjunctivae normal, sclerae normal, normal eye lids MOUTH/THROAT: lips without lesions,no lesions in the mouth,tongue is without lesions,uvula elevates in midline NECK: supple, trachea midline, no neck masses, no thyroid tenderness, no thyromegaly LYMPHATICS: no LAN in the neck, no supraclavicular LAN RESPIRATORY: breathing is even & unlabored, BS CTAB CARDIAC: RRR, no murmur,no extra heart sounds, no edema GI:  ABDOMEN: abdomen soft, normal BS, no masses, no tenderness  LIVER/SPLEEN: no hepatomegaly, no splenomegaly MUSCULOSKELETAL: HEAD: normal to inspection  EXTREMITIES: LEFT UPPER EXTREMITY: full range of motion, normal strength & tone RIGHT UPPER EXTREMITY:  full range of motion, normal strength & tone LEFT LOWER EXTREMITY:  Moderate range of motion, normal strength & tone RIGHT LOWER EXTREMITY:  range of motion not tested due to surgery, normal strength & tone PSYCHIATRIC: the patient is alert & oriented to person, affect & behavior appropriate  LABS/RADIOLOGY:  Labs reviewed: Basic Metabolic Panel:  Recent Labs  02/26/14 1005 03/04/14 0420 03/05/14 0449  NA 140 140 141  K 4.0 4.0 4.0  CL 104 107 104  CO2 25 24 25   GLUCOSE 92 189* 103*  BUN 18 13 14   CREATININE 0.95 0.84 0.87  CALCIUM 9.2 8.2* 8.5   Liver Function Tests:  Recent Labs  02/26/14 1005  AST 24  ALT 24  ALKPHOS  75  BILITOT 0.5  PROT 6.7  ALBUMIN 3.6   CBC:  Recent Labs  02/26/14 1005 03/04/14 0420 03/05/14 0449  WBC 7.0 11.2* 12.1*  HGB 14.7 12.2* 12.4*  HCT 42.7 34.4* 35.6*  MCV 89.9 88.7 90.4  PLT 203 202 189    ASSESSMENT/PLAN:  Right knee osteoarthritis-status post total knee arthroplasty. Continue  rehabilitation. BPH-continue proscar and Cardura Depression-continue current antidepressants. Denies ongoing symptoms. Acute blood loss anemia-check hemoglobin Constipation-well-controlled Check CBC with differential and BMP  I have reviewed patient's medical records received at admission/from hospitalization.  CPT CODE: 81829  Regis Wiland Y Chaslyn Eisen, [place] [phone]

## 2014-03-11 ENCOUNTER — Non-Acute Institutional Stay (SKILLED_NURSING_FACILITY): Payer: Medicare Other | Admitting: Adult Health

## 2014-03-11 ENCOUNTER — Encounter: Payer: Self-pay | Admitting: Adult Health

## 2014-03-11 DIAGNOSIS — F329 Major depressive disorder, single episode, unspecified: Secondary | ICD-10-CM

## 2014-03-11 DIAGNOSIS — F3289 Other specified depressive episodes: Secondary | ICD-10-CM

## 2014-03-11 DIAGNOSIS — M1711 Unilateral primary osteoarthritis, right knee: Secondary | ICD-10-CM

## 2014-03-11 DIAGNOSIS — D62 Acute posthemorrhagic anemia: Secondary | ICD-10-CM | POA: Diagnosis not present

## 2014-03-11 DIAGNOSIS — M1710 Unilateral primary osteoarthritis, unspecified knee: Secondary | ICD-10-CM

## 2014-03-11 DIAGNOSIS — N400 Enlarged prostate without lower urinary tract symptoms: Secondary | ICD-10-CM

## 2014-03-11 NOTE — Progress Notes (Signed)
Patient ID: George [name], male   DOB: Mar 12, 1941, 73 y.o.   MRN: [id number]              PROGRESS NOTE  DATE: 03/11/2014   FACILITY: [facility]  LEVEL OF CARE: SNF (31)  Acute Visit  Discharge Notes  HISTORY OF PRESENT ILLNESS:   This is a 73 year old male who is for discharge home with home health PT. DME: Rolling walker and tub bench. He has been admitted to [facility] on 03/05/14 from [facility] bleed osteoarthritis status post right total knee arthroplasty.   Patient was admitted to this facility for short-term rehabilitation after the patient's recent hospitalization.  Patient has completed SNF rehabilitation and therapy has cleared the patient for discharge.  Reassessment of ongoing problem(s):  BPH: The patient's BPH remains stable. Patient denies urinary hesitancy or dribbling. No complications reported from the current medications being used.  DEPRESSION: The depression remains stable. Patient denies ongoing feelings of sadness, insomnia, anedhonia or lack of appetite. No complications reported from the medications currently being used. Staff do not report behavioral problems.  ANEMIA: The anemia has been stable. The patient denies fatigue, melena or hematochezia. No complications from the medications currently being used. 9/15 hgb 10.8   Past Medical History  Diagnosis Date  . Arthritis   . 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 Small bowel obstruction      Reviewed.  No changes/see problem list  CURRENT MEDICATIONS: Reviewed per MAR/see medication list   No Known Allergies   REVIEW OF SYSTEMS:  GENERAL: no change in appetite, no fatigue, no weight changes, no fever, chills or weakness RESPIRATORY: no cough, SOB, DOE, wheezing, hemoptysis CARDIAC: no chest pain, edema or palpitations GI: no abdominal pain, diarrhea, constipation, heart burn, nausea or  vomiting  PHYSICAL EXAMINATION  GENERAL: no acute distress, normal body habitus EYES: conjunctivae normal, sclerae normal, normal eye lids NECK: supple, trachea midline, no neck masses, no thyroid tenderness, no thyromegaly LYMPHATICS: no LAN in the neck, no supraclavicular LAN RESPIRATORY: breathing is even & unlabored, BS CTAB CARDIAC: RRR, no murmur,no extra heart sounds, no edema GI: abdomen soft, normal BS, no masses, no tenderness, no hepatomegaly, no splenomegaly EXTREMITIES:  Able to move X 4 extremities PSYCHIATRIC: the patient is alert & oriented to person, affect & behavior appropriate  LABS/RADIOLOGY: Labs reviewed: Basic Metabolic Panel:  Recent Labs  02/26/14 1005 03/04/14 0420 03/05/14 0449  NA 140 140 141  K 4.0 4.0 4.0  CL 104 107 104  CO2 25 24 25   GLUCOSE 92 189* 103*  BUN 18 13 14   CREATININE 0.95 0.84 0.87  CALCIUM 9.2 8.2* 8.5   Liver Function Tests:  Recent Labs  02/26/14 1005  AST 24  ALT 24  ALKPHOS 75  BILITOT 0.5  PROT 6.7  ALBUMIN 3.6   CBC:  Recent Labs  02/26/14 1005 03/04/14 0420 03/05/14 0449  WBC 7.0 11.2* 12.1*  HGB 14.7 12.2* 12.4*  HCT 42.7 34.4* 35.6*  MCV 89.9 88.7 90.4  PLT 203 202 189    ASSESSMENT/PLAN:  Osteoarthritis status post right total knee arthroplasty - for home health PT Depression - stable; continue Wellbutrin XL and Celexa BPH - stable; continue Cardura and Proscar Anemia, acute blood loss - stable   I have filled out patient's discharge paperwork and written prescriptions.  Patient will receive home health PT.  DME provided: Rolling walker and tub bench    Total discharge time: Greater than 30 minutes  Discharge time involved coordination of the discharge process with Education officer, museum, nursing staff and therapy department. Medical justification for home health services/DME verified.  CPT CODE: 11735   MEDINA-VARGAS,MONINA, [facility] [phone]

## 2014-03-14 DIAGNOSIS — Z96653 Presence of artificial knee joint, bilateral: Secondary | ICD-10-CM | POA: Diagnosis not present

## 2014-03-14 DIAGNOSIS — R269 Unspecified abnormalities of gait and mobility: Secondary | ICD-10-CM | POA: Diagnosis not present

## 2014-03-14 DIAGNOSIS — Z87891 Personal history of nicotine dependence: Secondary | ICD-10-CM | POA: Diagnosis not present

## 2014-03-14 DIAGNOSIS — Z471 Aftercare following joint replacement surgery: Secondary | ICD-10-CM | POA: Diagnosis not present

## 2014-03-27 DIAGNOSIS — C44319 Basal cell carcinoma of skin of other parts of face: Secondary | ICD-10-CM | POA: Diagnosis not present

## 2014-04-08 DIAGNOSIS — Z471 Aftercare following joint replacement surgery: Secondary | ICD-10-CM | POA: Diagnosis not present

## 2014-04-08 DIAGNOSIS — Z96651 Presence of right artificial knee joint: Secondary | ICD-10-CM | POA: Diagnosis not present

## 2014-06-02 DIAGNOSIS — F329 Major depressive disorder, single episode, unspecified: Secondary | ICD-10-CM | POA: Diagnosis not present

## 2014-06-02 DIAGNOSIS — E291 Testicular hypofunction: Secondary | ICD-10-CM | POA: Diagnosis not present

## 2014-06-02 DIAGNOSIS — H60333 Swimmer's ear, bilateral: Secondary | ICD-10-CM | POA: Diagnosis not present

## 2014-06-02 DIAGNOSIS — Z23 Encounter for immunization: Secondary | ICD-10-CM | POA: Diagnosis not present

## 2014-06-02 DIAGNOSIS — M1990 Unspecified osteoarthritis, unspecified site: Secondary | ICD-10-CM | POA: Diagnosis not present

## 2014-06-02 DIAGNOSIS — E785 Hyperlipidemia, unspecified: Secondary | ICD-10-CM | POA: Diagnosis not present

## 2014-06-23 DIAGNOSIS — L814 Other melanin hyperpigmentation: Secondary | ICD-10-CM | POA: Diagnosis not present

## 2014-06-23 DIAGNOSIS — Z08 Encounter for follow-up examination after completed treatment for malignant neoplasm: Secondary | ICD-10-CM | POA: Diagnosis not present

## 2014-06-23 DIAGNOSIS — Z85828 Personal history of other malignant neoplasm of skin: Secondary | ICD-10-CM | POA: Diagnosis not present

## 2014-06-23 DIAGNOSIS — D1801 Hemangioma of skin and subcutaneous tissue: Secondary | ICD-10-CM | POA: Diagnosis not present

## 2014-06-29 ENCOUNTER — Emergency Department (HOSPITAL_COMMUNITY): Payer: Medicare Other

## 2014-06-29 ENCOUNTER — Inpatient Hospital Stay (HOSPITAL_COMMUNITY): Payer: Medicare Other

## 2014-06-29 ENCOUNTER — Inpatient Hospital Stay (HOSPITAL_COMMUNITY)
Admission: EM | Admit: 2014-06-29 | Discharge: 2014-07-03 | DRG: 390 | Disposition: A | Discharge status: Home / Self Care | Payer: Medicare Other | Attending: Family Medicine | Admitting: Family Medicine

## 2014-06-29 ENCOUNTER — Encounter (HOSPITAL_COMMUNITY): Payer: Self-pay | Admitting: Emergency Medicine

## 2014-06-29 DIAGNOSIS — M1710 Unilateral primary osteoarthritis, unspecified knee: Secondary | ICD-10-CM | POA: Diagnosis present

## 2014-06-29 DIAGNOSIS — K5669 Other intestinal obstruction: Secondary | ICD-10-CM | POA: Diagnosis not present

## 2014-06-29 DIAGNOSIS — K5730 Diverticulosis of large intestine without perforation or abscess without bleeding: Secondary | ICD-10-CM | POA: Diagnosis not present

## 2014-06-29 DIAGNOSIS — Z9049 Acquired absence of other specified parts of digestive tract: Secondary | ICD-10-CM

## 2014-06-29 DIAGNOSIS — K565 Intestinal adhesions [bands] with obstruction (postprocedural) (postinfection): Principal | ICD-10-CM | POA: Diagnosis present

## 2014-06-29 DIAGNOSIS — K760 Fatty (change of) liver, not elsewhere classified: Secondary | ICD-10-CM | POA: Diagnosis not present

## 2014-06-29 DIAGNOSIS — Z96652 Presence of left artificial knee joint: Secondary | ICD-10-CM | POA: Diagnosis present

## 2014-06-29 DIAGNOSIS — K56609 Unspecified intestinal obstruction, unspecified as to partial versus complete obstruction: Secondary | ICD-10-CM | POA: Diagnosis present

## 2014-06-29 DIAGNOSIS — R109 Unspecified abdominal pain: Secondary | ICD-10-CM

## 2014-06-29 DIAGNOSIS — Z85828 Personal history of other malignant neoplasm of skin: Secondary | ICD-10-CM

## 2014-06-29 DIAGNOSIS — Z9889 Other specified postprocedural states: Secondary | ICD-10-CM

## 2014-06-29 DIAGNOSIS — R1013 Epigastric pain: Secondary | ICD-10-CM | POA: Diagnosis not present

## 2014-06-29 DIAGNOSIS — K5660 Unspecified intestinal obstruction: Secondary | ICD-10-CM | POA: Diagnosis not present

## 2014-06-29 DIAGNOSIS — K598 Other specified functional intestinal disorders: Secondary | ICD-10-CM | POA: Diagnosis not present

## 2014-06-29 DIAGNOSIS — E876 Hypokalemia: Secondary | ICD-10-CM | POA: Diagnosis present

## 2014-06-29 DIAGNOSIS — Z96651 Presence of right artificial knee joint: Secondary | ICD-10-CM | POA: Diagnosis present

## 2014-06-29 DIAGNOSIS — Z87891 Personal history of nicotine dependence: Secondary | ICD-10-CM | POA: Diagnosis not present

## 2014-06-29 DIAGNOSIS — N281 Cyst of kidney, acquired: Secondary | ICD-10-CM | POA: Diagnosis not present

## 2014-06-29 DIAGNOSIS — F419 Anxiety disorder, unspecified: Secondary | ICD-10-CM | POA: Diagnosis present

## 2014-06-29 DIAGNOSIS — Z7982 Long term (current) use of aspirin: Secondary | ICD-10-CM

## 2014-06-29 DIAGNOSIS — Z85038 Personal history of other malignant neoplasm of large intestine: Secondary | ICD-10-CM

## 2014-06-29 DIAGNOSIS — Z4659 Encounter for fitting and adjustment of other gastrointestinal appliance and device: Secondary | ICD-10-CM

## 2014-06-29 DIAGNOSIS — F329 Major depressive disorder, single episode, unspecified: Secondary | ICD-10-CM | POA: Diagnosis present

## 2014-06-29 DIAGNOSIS — R112 Nausea with vomiting, unspecified: Secondary | ICD-10-CM

## 2014-06-29 DIAGNOSIS — M179 Osteoarthritis of knee, unspecified: Secondary | ICD-10-CM | POA: Diagnosis present

## 2014-06-29 LAB — COMPREHENSIVE METABOLIC PANEL
ALT: 30 U/L (ref 0–53)
ANION GAP: 10 (ref 5–15)
AST: 35 U/L (ref 0–37)
Albumin: 4.1 g/dL (ref 3.5–5.2)
Alkaline Phosphatase: 72 U/L (ref 39–117)
BUN: 20 mg/dL (ref 6–23)
CALCIUM: 9.2 mg/dL (ref 8.4–10.5)
CO2: 23 mmol/L (ref 19–32)
Chloride: 104 mEq/L (ref 96–112)
Creatinine, Ser: 1.14 mg/dL (ref 0.50–1.35)
GFR calc Af Amer: 73 mL/min — ABNORMAL LOW (ref >90)
GFR calc non Af Amer: 63 mL/min — ABNORMAL LOW (ref >90)
GLUCOSE: 166 mg/dL — AB (ref 70–99)
Potassium: 3.8 mmol/L (ref 3.5–5.1)
SODIUM: 137 mmol/L (ref 135–145)
Total Bilirubin: 0.7 mg/dL (ref 0.3–1.2)
Total Protein: 6.8 g/dL (ref 6.0–8.3)

## 2014-06-29 LAB — I-STAT CHEM 8, ED
BUN: 31 mg/dL — AB (ref 6–23)
CHLORIDE: 103 meq/L (ref 96–112)
Calcium, Ion: 1.12 mmol/L — ABNORMAL LOW (ref 1.13–1.30)
Creatinine, Ser: 1 mg/dL (ref 0.50–1.35)
GLUCOSE: 167 mg/dL — AB (ref 70–99)
HCT: 45 % (ref 39.0–52.0)
Hemoglobin: 15.3 g/dL (ref 13.0–17.0)
Potassium: 4.3 mmol/L (ref 3.5–5.1)
Sodium: 142 mmol/L (ref 135–145)
TCO2: 26 mmol/L (ref 0–100)

## 2014-06-29 LAB — I-STAT TROPONIN, ED: Troponin i, poc: 0 ng/mL (ref 0.00–0.08)

## 2014-06-29 LAB — CBC WITH DIFFERENTIAL/PLATELET
Basophils Absolute: 0 10*3/uL (ref 0.0–0.1)
Basophils Relative: 0 % (ref 0–1)
Eosinophils Absolute: 0.2 10*3/uL (ref 0.0–0.7)
Eosinophils Relative: 2 % (ref 0–5)
HCT: 45.6 % (ref 39.0–52.0)
Hemoglobin: 16.1 g/dL (ref 13.0–17.0)
LYMPHS ABS: 2.2 10*3/uL (ref 0.7–4.0)
LYMPHS PCT: 21 % (ref 12–46)
MCH: 30.8 pg (ref 26.0–34.0)
MCHC: 35.3 g/dL (ref 30.0–36.0)
MCV: 87.2 fL (ref 78.0–100.0)
Monocytes Absolute: 0.8 10*3/uL (ref 0.1–1.0)
Monocytes Relative: 7 % (ref 3–12)
NEUTROS ABS: 7.7 10*3/uL (ref 1.7–7.7)
Neutrophils Relative %: 70 % (ref 43–77)
Platelets: 228 10*3/uL (ref 150–400)
RBC: 5.23 MIL/uL (ref 4.22–5.81)
RDW: 12.7 % (ref 11.5–15.5)
WBC: 10.9 10*3/uL — AB (ref 4.0–10.5)

## 2014-06-29 LAB — PROTIME-INR
INR: 0.9 (ref 0.00–1.49)
Prothrombin Time: 12.3 seconds (ref 11.6–15.2)

## 2014-06-29 LAB — I-STAT CG4 LACTIC ACID, ED: LACTIC ACID, VENOUS: 2.07 mmol/L (ref 0.5–2.2)

## 2014-06-29 LAB — LIPASE, BLOOD: Lipase: 33 U/L (ref 11–59)

## 2014-06-29 MED ORDER — IOHEXOL 300 MG/ML  SOLN
100.0000 mL | Freq: Once | INTRAMUSCULAR | Status: AC | PRN
  Administered 2014-06-29: 100 mL via INTRAVENOUS

## 2014-06-29 MED ORDER — ACETAMINOPHEN 325 MG PO TABS: 650.0000 mg | ORAL_TABLET | Freq: Four times a day (QID) | ORAL | Status: DC | PRN

## 2014-06-29 MED ORDER — OXYCODONE HCL 5 MG PO TABS
5.0000 mg | ORAL_TABLET | ORAL | Status: DC | PRN
  Administered 2014-07-03: 5 mg via ORAL
  Filled 2014-06-29: qty 1

## 2014-06-29 MED ORDER — ONDANSETRON HCL 4 MG/2ML IJ SOLN
4.0000 mg | Freq: Once | INTRAMUSCULAR | Status: AC
  Administered 2014-06-29: 4 mg via INTRAVENOUS

## 2014-06-29 MED ORDER — HYDROMORPHONE HCL 1 MG/ML IJ SOLN
1.0000 mg | Freq: Once | INTRAMUSCULAR | Status: AC
  Administered 2014-06-29: 1 mg via INTRAVENOUS
  Filled 2014-06-29: qty 1

## 2014-06-29 MED ORDER — ACETAMINOPHEN 650 MG RE SUPP
650.0000 mg | Freq: Four times a day (QID) | RECTAL | Status: DC | PRN
  Administered 2014-07-01: 650 mg via RECTAL
  Filled 2014-06-29: qty 1

## 2014-06-29 MED ORDER — KCL IN DEXTROSE-NACL 20-5-0.45 MEQ/L-%-% IV SOLN
INTRAVENOUS | Status: DC
  Administered 2014-06-29 – 2014-06-30 (×3): via INTRAVENOUS
  Administered 2014-06-30: 125 mL/h via INTRAVENOUS
  Administered 2014-07-01: 06:00:00 via INTRAVENOUS
  Filled 2014-06-29 (×9): qty 1000

## 2014-06-29 MED ORDER — ONDANSETRON HCL 4 MG/2ML IJ SOLN
4.0000 mg | Freq: Four times a day (QID) | INTRAMUSCULAR | Status: DC | PRN
  Administered 2014-06-30: 4 mg via INTRAVENOUS
  Filled 2014-06-29: qty 2

## 2014-06-29 MED ORDER — IOHEXOL 300 MG/ML  SOLN
50.0000 mL | Freq: Once | INTRAMUSCULAR | Status: AC | PRN
  Administered 2014-06-29: 50 mL via ORAL

## 2014-06-29 MED ORDER — ONDANSETRON HCL 4 MG PO TABS: 4.0000 mg | ORAL_TABLET | Freq: Four times a day (QID) | ORAL | Status: DC | PRN

## 2014-06-29 MED ORDER — MORPHINE SULFATE 2 MG/ML IJ SOLN
2.0000 mg | INTRAMUSCULAR | Status: DC | PRN
  Administered 2014-06-29 – 2014-06-30 (×9): 2 mg via INTRAVENOUS
  Filled 2014-06-29 (×9): qty 1

## 2014-06-29 MED ORDER — ONDANSETRON HCL 4 MG/2ML IJ SOLN
4.0000 mg | Freq: Once | INTRAMUSCULAR | Status: AC
  Administered 2014-06-29: 4 mg via INTRAVENOUS
  Filled 2014-06-29: qty 2

## 2014-06-29 MED ORDER — SODIUM CHLORIDE 0.9 % IV BOLUS (SEPSIS)
1000.0000 mL | Freq: Once | INTRAVENOUS | Status: AC
  Administered 2014-06-29: 1000 mL via INTRAVENOUS

## 2014-06-29 MED ORDER — HEPARIN SODIUM (PORCINE) 5000 UNIT/ML IJ SOLN
5000.0000 [IU] | Freq: Three times a day (TID) | INTRAMUSCULAR | Status: DC
  Administered 2014-06-29 – 2014-07-03 (×12): 5000 [IU] via SUBCUTANEOUS
  Filled 2014-06-29 (×15): qty 1

## 2014-06-29 NOTE — ED Provider Notes (Signed)
CSN: [id number]     Arrival date & time 06/29/14  0302 History   First MD Initiated Contact with Patient 06/29/14 [id number]     Chief Complaint  Patient presents with  . Abdominal Pain  . Chest Pain     (Consider location/radiation/quality/duration/timing/severity/associated sxs/prior Treatment) HPI   George [name] is a 74 y.o. male with PMH of colon cancer s/p resection and SBO x 1, here with severe midepigastric abdominal pain for the past 2 hours. Patient describes it as a sharp pain with radiation up into his chest. Nothing has made his symptoms better or worse. He states this is consistent with his prior small bowel obstruction. He has had nausea and multiple episodes of vomiting. His last bowel movement was yesterday and it was normal. He denies any fevers or recent infections. Patient has no further complaints.   10 Systems reviewed and are negative for acute change except as noted in the HPI.   Past Medical History  Diagnosis Date  . Arthritis   . 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 Small bowel obstruction    Past Surgical History  Procedure Laterality Date  . Hemicolectomy    . Skin cancer removed from nose     . Joint replacement      left knee   . 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 History  Substance Use Topics  . Smoking status: Former Research scientist (life sciences)  . Smokeless tobacco: Never Used  . Alcohol Use: Yes     Comment: 6 beers per week    Review of Systems    Allergies  Review of patient's allergies indicates no known allergies.  Home Medications   Prior to Admission medications   Medication Sig Start Date End Date Taking? Authorizing Provider  acetaminophen (TYLENOL) 325 MG tablet Take 2 tablets (650 mg total) by mouth every 6  (six) hours as needed for mild pain (or Fever >/= 101). 03/05/14   Arlee Muslim, PA-C  bisacodyl (DULCOLAX) 10 MG suppository Place 1 suppository (10 mg total) rectally daily as needed for moderate constipation. 03/05/14   Arlee Muslim, PA-C  buPROPion (WELLBUTRIN XL) 150 MG 24 hr tablet Take 450 mg by mouth every morning.    Historical Provider, MD  citalopram (CELEXA) 20 MG tablet Take 20 mg by mouth at bedtime.    Historical Provider, MD  docusate sodium 100 MG CAPS Take 100 mg by mouth 2 (two) times daily. 03/05/14   Arlee Muslim, PA-C  doxazosin (CARDURA) 8 MG tablet Take 8 mg by mouth at bedtime.    Historical Provider, MD  finasteride (PROSCAR) 5 MG tablet Take 5 mg by mouth every morning.    Historical Provider, MD  methocarbamol (ROBAXIN) 500 MG tablet Take 1 tablet (500 mg total) by mouth every 6 (six) hours as needed for muscle spasms. 03/05/14   Arlee Muslim, PA-C  metoCLOPramide (REGLAN) 5 MG tablet Take 1-2 tablets (5-10 mg total) by mouth every 8 (eight) hours as needed for nausea (if ondansetron (ZOFRAN) ineffective.). 03/05/14   Arlee Muslim, PA-C  ondansetron (ZOFRAN) 4 MG tablet Take 1 tablet (4 mg total) by mouth every 6 (six) hours as needed for nausea. 03/05/14   Arlee Muslim, PA-C  oxyCODONE (OXY IR/ROXICODONE) 5  MG immediate release tablet Take 1-2 tablets (5-10 mg total) by mouth every 3 (three) hours as needed for moderate pain, severe pain or breakthrough pain. 03/05/14   Arlee Muslim, PA-C  polyethylene glycol [facility] / GLYCOLAX) packet Take 17 g by mouth daily as needed for mild constipation. 03/05/14   Arlee Muslim, PA-C  rivaroxaban (XARELTO) 10 MG TABS tablet Take 1 tablet (10 mg total) by mouth daily with breakfast. Take Xarelto for two and a half more weeks, then discontinue Xarelto. Once the patient has completed the Xarelto, they may resume the 81 mg Aspirin. 03/05/14   Arlee Muslim, PA-C   BP 143/80 mmHg  Pulse 88  Temp(Src)   Resp 19  SpO2 98% Physical Exam   Constitutional: He is oriented to person, place, and time. Vital signs are normal. He appears well-developed and well-nourished.  Non-toxic appearance. He does not appear ill. He appears distressed.  HENT:  Head: Normocephalic and atraumatic.  Nose: Nose normal.  Mouth/Throat: No oropharyngeal exudate.  Dry oropharynx  Eyes: Conjunctivae and EOM are normal. Pupils are equal, round, and reactive to light. No scleral icterus.  Neck: Normal range of motion. Neck supple. No tracheal deviation, no edema, no erythema and normal range of motion present. No thyroid mass and no thyromegaly present.  Cardiovascular: Normal rate, regular rhythm, S1 normal, S2 normal, normal heart sounds, intact distal pulses and normal pulses.  Exam reveals no gallop and no friction rub.   No murmur heard. Pulses:      Radial pulses are 2+ on the right side, and 2+ on the left side.       Dorsalis pedis pulses are 2+ on the right side, and 2+ on the left side.  Pulmonary/Chest: Effort normal and breath sounds normal. No respiratory distress. He has no wheezes. He has no rhonchi. He has no rales.  Abdominal: Soft. Normal appearance and bowel sounds are normal. He exhibits distension. He exhibits no ascites and no mass. There is no hepatosplenomegaly. There is tenderness. There is guarding. There is no rebound and no CVA tenderness.  Midepigastric tenderness to palpation.  Musculoskeletal: Normal range of motion. He exhibits no edema or tenderness.  Lymphadenopathy:    He has no cervical adenopathy.  Neurological: He is alert and oriented to person, place, and time. He has normal strength. No cranial nerve deficit or sensory deficit. He exhibits normal muscle tone. GCS eye subscore is 4. GCS verbal subscore is 5. GCS motor subscore is 6.  Skin: Skin is warm, dry and intact. No petechiae and no rash noted. He is not diaphoretic. No erythema. No pallor.  Psychiatric: He has a normal mood and affect. His behavior is normal.  Judgment normal.  Nursing note and vitals reviewed.   ED Course  Procedures (including critical care time) Labs Review Labs Reviewed  CBC WITH DIFFERENTIAL - Abnormal; Notable for the following:    WBC 10.9 (*)    All other components within normal limits  COMPREHENSIVE METABOLIC PANEL - Abnormal; Notable for the following:    Glucose, Bld 166 (*)    GFR calc non Af Amer 63 (*)    GFR calc Af Amer 73 (*)    All other components within normal limits  I-STAT CHEM 8, ED - Abnormal; Notable for the following:    BUN 31 (*)    Glucose, Bld 167 (*)    Calcium, Ion 1.12 (*)    All other components within normal limits  LIPASE, BLOOD  PROTIME-INR  URINALYSIS, ROUTINE W REFLEX MICROSCOPIC  I-STAT TROPOININ, ED  I-STAT CG4 LACTIC ACID, ED    Imaging Review Ct Abdomen Pelvis W Contrast  06/29/2014   CLINICAL DATA:  Acute onset of epigastric abdominal pain and vomiting. Diaphoresis. Initial encounter.  EXAM: CT ABDOMEN AND PELVIS WITH CONTRAST  TECHNIQUE: Multidetector CT imaging of the abdomen and pelvis was performed using the standard protocol following bolus administration of intravenous contrast.  CONTRAST:  32mL OMNIPAQUE IOHEXOL 300 MG/ML SOLN, 117mL OMNIPAQUE IOHEXOL 300 MG/ML SOLN  COMPARISON:  Abdominal radiographs performed 10/21/2007  FINDINGS: The visualized lung bases are clear.  The liver and spleen are unremarkable in appearance. The gallbladder is within normal limits. The pancreas and adrenal glands are unremarkable.  Mild nonspecific perinephric stranding is noted bilaterally. Several left renal cysts are seen, measuring up to 2.8 cm in size. There is no evidence of hydronephrosis. No renal or ureteral stones are seen. No perinephric stranding is appreciated.  There is mild dilatation of visualized small bowel loops to the level of the distal ileum, where there is mild fecalization and gradual decompression of the small bowel. Small bowel loops measure up to 3.2 cm in diameter.  No focal transition point is seen. There is mild fatty infiltration of the wall of the distal ileum, concerning for sequelae of chronic inflammation. The patient's ileocolic anastomosis at the right mid abdomen is unremarkable in appearance.  No free fluid is identified. The stomach is within normal limits. No acute vascular abnormalities are seen. Mild scattered calcification is seen along the abdominal aorta and its branches. A circumaortic left renal vein is noted.  The remaining colon is mostly decompressed. Minimal diverticulosis is noted along the descending and proximal sigmoid colon, without evidence of diverticulitis.  The bladder is mildly distended and grossly unremarkable. The prostate is enlarged, measuring 5.4 cm in transverse dimension. No inguinal lymphadenopathy is seen.  No acute osseous abnormalities are identified. Facet disease is noted at the lower lumbar spine.  IMPRESSION: 1. Mild dilatation of small bowel loops to the level of the distal ileum, where there is mild fecalization and gradual decompression of small bowel. No focal transition point seen. However, this could reflect a partial small bowel obstruction due to an adhesion. Small bowel loops measure up to 3.2 cm in diameter. 2. Mild fatty infiltration of the wall of the distal ileum, concerning for sequelae of chronic inflammation. Ileocolic anastomosis is unremarkable in appearance. 3. Minimal diverticulosis along the descending and proximal sigmoid colon, without evidence of diverticulitis. 4. Left renal cysts seen. 5. Enlarged prostate noted.   Electronically Signed   By: Garald Balding M.D.   On: 06/29/2014 05:50     EKG Interpretation   Date/Time:  Sunday June 29 2014 03:11:38 EST Ventricular Rate:  92 PR Interval:  185 QRS Duration: 114 QT Interval:  385 QTC Calculation: 476 R Axis:   -158 Text Interpretation:  Artifact Confirmed by Glynn Octave [phone])  on 06/29/2014 4:17:52 AM      MDM   Final  diagnoses:  Abdominal pain    Patient does emergency department for severe abdominal pain. He states is consistent with his prior small bowel obstruction, however this did occur acutely and severely. I also have concern for a vascular process in the abdomen. Will evaluate both with CT scan of the abdomen. Patient was given Dilaudid and Zofran for pain relief. IV fluids ordered.  CT scan reveals small bowel obstruction with no transition point but possible obstruction due  to adhesion. Patient was informed of these results, he will likely need a nasogastric tube. Patient is admitted to [facility], MedSurg unit. They requested that I consult general surgery, this was done and they will evaluate the patient as well. Pain has improved after Dilaudid and Zofran was given.    Everlene Balls, MD 06/29/14 [phone]

## 2014-06-29 NOTE — Consult Note (Signed)
Chief Complaint:  Abdominal pain nausea and vomiting  History of Present Illness:  George [name] is an 74 y.o. male who ate pizza and enjoyed the [employer] playoffs yesterday until last night when he became distended begin having nausea and vomiting.  This has occurred before and it has resolved.  He had a right hemicolectomy through a right transverse incision by Dr. Rise Patience in 2005 for benign disease.  No flatus since onset.    Past Medical History  Diagnosis Date  . Arthritis   . 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 Small bowel obstruction     Past Surgical History  Procedure Laterality Date  . Hemicolectomy    . Skin cancer removed from nose     . Joint replacement      left knee   . 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   Review of patient's allergies indicates no known allergies. History reviewed. No pertinent family history. Social History:   reports that he has quit smoking. He has never used smokeless tobacco. He reports that he drinks alcohol. He reports that he does not use illicit drugs.   REVIEW OF SYSTEMS : Negative except for joint replacement.    Physical Exam:   Blood pressure 148/82, pulse 94, temperature 98.3 F (36.8 C), temperature source Oral, resp. rate 22, height 5' 8"  (1.727 m), weight 172 lb (78.019 kg), SpO2 94 %. Body mass index is 26.16 kg/(m^2).  Gen:  WDWN WM NAD --he is distended but not in obvious pain Neurological: Alert and oriented to person, place, and time. Motor and sensory function is grossly intact  Head: Normocephalic and atraumatic.  Eyes: Conjunctivae are normal. Pupils are equal, round, and reactive to light. No scleral icterus.  Neck: Normal range of motion. Neck  supple. No tracheal deviation or thyromegaly present.  Cardiovascular:  SR without murmurs or gallops.  No carotid bruits Breast:  Not examined Respiratory: Effort normal.  No respiratory distress. No chest wall tenderness. Breath sounds normal.  No wheezes, rales or rhonchi.  Abdomen:  Protuberant with multiple palpable lipomata.  Tender to compression in the upper abdomen.  No palpable herniae.   GU:  Nl male but enlarged prostate by CT Musculoskeletal: Normal range of motion. Extremities are nontender. No cyanosis, edema or clubbing noted Lymphadenopathy: No cervical, preauricular, postauricular or axillary adenopathy is present Skin: Skin is warm and dry. No rash noted. No diaphoresis. No erythema. No pallor. Pscyh: Normal mood and affect. Behavior is normal. Judgment and thought content normal.   LABORATORY RESULTS: Results for orders placed or performed during the hospital encounter of 06/29/14 (from the past 48 hour(s))  CBC with Differential     Status: Abnormal   Collection Time: 06/29/14  3:35 AM  Result Value Ref Range   WBC 10.9 (H) 4.0 - 10.5 K/uL   RBC 5.23 4.22 - 5.81 MIL/uL   Hemoglobin 16.1 13.0 - 17.0 g/dL   HCT 45.6 39.0 - 52.0 %   MCV 87.2 78.0 - 100.0 fL   MCH 30.8 26.0 - 34.0 pg   MCHC 35.3 30.0 - 36.0 g/dL   RDW 12.7 11.5 - 15.5 %   Platelets 228 150 - 400 K/uL   Neutrophils  Relative % 70 43 - 77 %   Neutro Abs 7.7 1.7 - 7.7 K/uL   Lymphocytes Relative 21 12 - 46 %   Lymphs Abs 2.2 0.7 - 4.0 K/uL   Monocytes Relative 7 3 - 12 %   Monocytes Absolute 0.8 0.1 - 1.0 K/uL   Eosinophils Relative 2 0 - 5 %   Eosinophils Absolute 0.2 0.0 - 0.7 K/uL   Basophils Relative 0 0 - 1 %   Basophils Absolute 0.0 0.0 - 0.1 K/uL  Comprehensive metabolic panel     Status: Abnormal   Collection Time: 06/29/14  3:35 AM  Result Value Ref Range   Sodium 137 135 - 145 mmol/L    Comment: Please note change in reference range.   Potassium 3.8 3.5 - 5.1 mmol/L    Comment: Please  note change in reference range.   Chloride 104 96 - 112 mEq/L   CO2 23 19 - 32 mmol/L   Glucose, Bld 166 (H) 70 - 99 mg/dL   BUN 20 6 - 23 mg/dL   Creatinine, Ser 1.14 0.50 - 1.35 mg/dL   Calcium 9.2 8.4 - 10.5 mg/dL   Total Protein 6.8 6.0 - 8.3 g/dL   Albumin 4.1 3.5 - 5.2 g/dL   AST 35 0 - 37 U/L   ALT 30 0 - 53 U/L   Alkaline Phosphatase 72 39 - 117 U/L   Total Bilirubin 0.7 0.3 - 1.2 mg/dL   GFR calc non Af Amer 63 (L) >90 mL/min   GFR calc Af Amer 73 (L) >90 mL/min    Comment: (NOTE) The eGFR has been calculated using the CKD EPI equation. This calculation has not been validated in all clinical situations. eGFR's persistently <90 mL/min signify possible Chronic Kidney Disease.    Anion gap 10 5 - 15  Lipase, blood     Status: None   Collection Time: 06/29/14  3:35 AM  Result Value Ref Range   Lipase 33 11 - 59 U/L  I-stat troponin, ED (only if pt is 72 y.o. or older & pain is above umbilicus) - do not order at [facility]     Status: None   Collection Time: 06/29/14  3:39 AM  Result Value Ref Range   Troponin i, poc 0.00 0.00 - 0.08 ng/mL   Comment 3            Comment: Due to the release kinetics of cTnI, a negative result within the first hours of the onset of symptoms does not rule out myocardial infarction with certainty. If myocardial infarction is still suspected, repeat the test at appropriate intervals.   Protime-INR     Status: None   Collection Time: 06/29/14  4:12 AM  Result Value Ref Range   Prothrombin Time 12.3 11.6 - 15.2 seconds   INR 0.90 0.00 - 1.49  I-stat chem 8, ed     Status: Abnormal   Collection Time: 06/29/14  5:02 AM  Result Value Ref Range   Sodium 142 135 - 145 mmol/L   Potassium 4.3 3.5 - 5.1 mmol/L   Chloride 103 96 - 112 mEq/L   BUN 31 (H) 6 - 23 mg/dL   Creatinine, Ser 1.00 0.50 - 1.35 mg/dL   Glucose, Bld 167 (H) 70 - 99 mg/dL   Calcium, Ion 1.12 (L) 1.13 - 1.30 mmol/L   TCO2 26 0 - 100 mmol/L   Hemoglobin 15.3 13.0 - 17.0 g/dL    HCT 45.0 39.0 - 52.0 %  I-Stat CG4 Lactic Acid, ED     Status: None   Collection Time: 06/29/14  5:04 AM  Result Value Ref Range   Lactic Acid, Venous 2.07 0.5 - 2.2 mmol/L     RADIOLOGY RESULTS: Ct Abdomen Pelvis W Contrast  06/29/2014   CLINICAL DATA:  Acute onset of epigastric abdominal pain and vomiting. Diaphoresis. Initial encounter.  EXAM: CT ABDOMEN AND PELVIS WITH CONTRAST  TECHNIQUE: Multidetector CT imaging of the abdomen and pelvis was performed using the standard protocol following bolus administration of intravenous contrast.  CONTRAST:  66m OMNIPAQUE IOHEXOL 300 MG/ML SOLN, 1081mOMNIPAQUE IOHEXOL 300 MG/ML SOLN  COMPARISON:  Abdominal radiographs performed 10/21/2007  FINDINGS: The visualized lung bases are clear.  The liver and spleen are unremarkable in appearance. The gallbladder is within normal limits. The pancreas and adrenal glands are unremarkable.  Mild nonspecific perinephric stranding is noted bilaterally. Several left renal cysts are seen, measuring up to 2.8 cm in size. There is no evidence of hydronephrosis. No renal or ureteral stones are seen. No perinephric stranding is appreciated.  There is mild dilatation of visualized small bowel loops to the level of the distal ileum, where there is mild fecalization and gradual decompression of the small bowel. Small bowel loops measure up to 3.2 cm in diameter. No focal transition point is seen. There is mild fatty infiltration of the wall of the distal ileum, concerning for sequelae of chronic inflammation. The patient's ileocolic anastomosis at the right mid abdomen is unremarkable in appearance.  No free fluid is identified. The stomach is within normal limits. No acute vascular abnormalities are seen. Mild scattered calcification is seen along the abdominal aorta and its branches. A circumaortic left renal vein is noted.  The remaining colon is mostly decompressed. Minimal diverticulosis is noted along the descending and  proximal sigmoid colon, without evidence of diverticulitis.  The bladder is mildly distended and grossly unremarkable. The prostate is enlarged, measuring 5.4 cm in transverse dimension. No inguinal lymphadenopathy is seen.  No acute osseous abnormalities are identified. Facet disease is noted at the lower lumbar spine.  IMPRESSION: 1. Mild dilatation of small bowel loops to the level of the distal ileum, where there is mild fecalization and gradual decompression of small bowel. No focal transition point seen. However, this could reflect a partial small bowel obstruction due to an adhesion. Small bowel loops measure up to 3.2 cm in diameter. 2. Mild fatty infiltration of the wall of the distal ileum, concerning for sequelae of chronic inflammation. Ileocolic anastomosis is unremarkable in appearance. 3. Minimal diverticulosis along the descending and proximal sigmoid colon, without evidence of diverticulitis. 4. Left renal cysts seen. 5. Enlarged prostate noted.   Electronically Signed   By: JeGarald Balding.D.   On: 06/29/2014 05:50    Problem List: Patient Active Problem List   Diagnosis Date Noted  . SBO (small bowel obstruction) 06/29/2014  . Hypertrophy of prostate without urinary obstruction and other lower urinary tract symptoms (LUTS) 03/07/2014  . Depressive disorder, not elsewhere classified 03/07/2014  . Postoperative anemia due to acute blood loss 03/07/2014  . 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  Will order NG tube and observe.  Urine concentrated-will increase IV fluids    Matt B. MaHassell DoneMD, FAAbrazo Maryv[facility] Campusur[facility], [facility] [id number] [phone] [phone]/2016 7:55 AM

## 2014-06-29 NOTE — ED Notes (Signed)
Pt c/o epigastric pain x 2 hrs. Pt vomiting in triage, diaphoretic, pale.

## 2014-06-29 NOTE — Progress Notes (Signed)
Attempted NG insertion. Met resistance in right nare, stopped immediately and attempted in left nare. NG proceeded smoothly and patient taking sips of water and swallowing appropriately, but tube would coil in mouth. Re-educated patient about the process for insertion and when to take sips of water. Attempted once more in left nare. Entered smoothly and met no resistance. Patient was very anxious and would swallow water very quickly. After stopping to re-educate patient again, then proceeded with insertion, tube coiled again in the mouth. Tube removed completely. Patient had some swelling in his left cheek and throat. Dr. Hassell Done notified and nurse given orders to have NG inserted by radiology.

## 2014-06-29 NOTE — H&P (Signed)
[facility] History and Physical  MEYER ARORA HKV:[id number] DOB: Dec 03, 1940 DOA: 06/29/2014  Referring physician:  PCP: [name], MD   Chief Complaint: Abdominal pain/nausea vomiting  HPI: George [name] is a 74 y.o. male with a past medical history of colon cancer status post hemicolectomy, osteoarthritis, status post right total knee arthroplasty, presenting to the emergency department at [facility] with complaints of abdominal pain. He stated that symptoms started yesterday afternoon at approximately 1:00 PM at which time he was having pizza and beer watching NFL game on TV, when he started experiencing epigastric pain characterized as crampy that became progressively worse over the afternoon. He also noted worsening abdominal distention and associated nausea. He does not recall passing flatus. While at the hospital he had episode of emesis. He denied hematemesis, bloody stools, black tarry stools, fevers, chills, dysuria, hematuria, chest pain. He reports having a history of small bowel obstruction and states that current symptoms are similar to previous presentation of SBO. Workup in the emergency department included a CT scan of abdomen and pelvis with IV contrast that showed dilatation of small bowel loops at level of distal ileum, consistent with partial small bowel obstruction due to adhesion. General surgery was consulted, NG tube placed.                                                                                                                                                                       Review of Systems:  Constitutional:  No weight loss, night sweats, Fevers, chills, fatigue.  HEENT:  No headaches, Difficulty swallowing,Tooth/dental problems,Sore throat,  No sneezing, itching, ear ache, nasal congestion, post nasal drip,  Cardio-vascular:  No chest pain, Orthopnea, PND, swelling in lower extremities, anasarca, dizziness, palpitations    GI:  No heartburn, indigestion, positive for abdominal pain, nausea, vomiting, diarrhea, change in bowel habits, loss of appetite  Resp:  No shortness of breath with exertion or at rest. No excess mucus, no productive cough, No non-productive cough, No coughing up of blood.No change in color of mucus.No wheezing.No chest wall deformity  Skin:  no rash or lesions.  GU:  no dysuria, change in color of urine, no urgency or frequency. No flank pain.  Musculoskeletal:  No joint pain or swelling. No decreased range of motion. No back pain.  Psych:  No change in mood or affect. No depression or anxiety. No memory loss.   Past Medical History  Diagnosis Date  . Arthritis   . 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 Small bowel obstruction    Past Surgical History  Procedure Laterality Date  . Hemicolectomy    . Skin  cancer removed from nose     . Joint replacement      left knee   . Total knee arthroplasty Right 03/03/2014    Procedure: RIGHT TOTAL KNEE ARTHROPLASTY;  Surgeon: Gearlean Alf, MD;  Location: [facility];  Service: Orthopedics;  Laterality: Right;   Social History:  reports that he has quit smoking. He has never used smokeless tobacco. He reports that he drinks alcohol. He reports that he does not use illicit drugs.  No Known Allergies  History reviewed. No pertinent family history.   Prior to Admission medications   Medication Sig Start Date End Date Taking? Authorizing Provider  acetaminophen (TYLENOL) 325 MG tablet Take 2 tablets (650 mg total) by mouth every 6 (six) hours as needed for mild pain (or Fever >/= 101). 03/05/14  Yes Arlee Muslim, PA-C  aspirin 81 MG chewable tablet Chew 81 mg by mouth daily.   Yes Historical Provider, MD  buPROPion (WELLBUTRIN XL) 150 MG 24 hr tablet Take 450 mg by mouth every morning.   Yes Historical Provider, MD  celecoxib (CELEBREX) 200 MG capsule Take 200 mg by mouth daily.    Yes Historical Provider, MD  citalopram (CELEXA) 20 MG tablet Take 20 mg by mouth at bedtime.   Yes Historical Provider, MD  doxazosin (CARDURA) 8 MG tablet Take 8 mg by mouth at bedtime.   Yes Historical Provider, MD  finasteride (PROSCAR) 5 MG tablet Take 5 mg by mouth every morning.   Yes Historical Provider, MD  Multiple Vitamin (MULTIVITAMIN WITH MINERALS) TABS tablet Take 1 tablet by mouth daily.   Yes Historical Provider, MD  bisacodyl (DULCOLAX) 10 MG suppository Place 1 suppository (10 mg total) rectally daily as needed for moderate constipation. Patient not taking: Reported on 06/29/2014 03/05/14   Arlee Muslim, PA-C  docusate sodium 100 MG CAPS Take 100 mg by mouth 2 (two) times daily. Patient not taking: Reported on 06/29/2014 03/05/14   Arlee Muslim, PA-C  methocarbamol (ROBAXIN) 500 MG tablet Take 1 tablet (500 mg total) by mouth every 6 (six) hours as needed for muscle spasms. Patient not taking: Reported on 06/29/2014 03/05/14   Arlee Muslim, PA-C  metoCLOPramide (REGLAN) 5 MG tablet Take 1-2 tablets (5-10 mg total) by mouth every 8 (eight) hours as needed for nausea (if ondansetron (ZOFRAN) ineffective.). Patient not taking: Reported on 06/29/2014 03/05/14   Arlee Muslim, PA-C  ondansetron (ZOFRAN) 4 MG tablet Take 1 tablet (4 mg total) by mouth every 6 (six) hours as needed for nausea. Patient not taking: Reported on 06/29/2014 03/05/14   Arlee Muslim, PA-C  oxyCODONE (OXY IR/ROXICODONE) 5 MG immediate release tablet Take 1-2 tablets (5-10 mg total) by mouth every 3 (three) hours as needed for moderate pain, severe pain or breakthrough pain. Patient not taking: Reported on 06/29/2014 03/05/14   Arlee Muslim, PA-C  polyethylene glycol [facility] Floria Raveling) packet Take 17 g by mouth daily as needed for mild constipation. Patient not taking: Reported on 06/29/2014 03/05/14   Arlee Muslim, PA-C  rivaroxaban (XARELTO) 10 MG TABS tablet Take 1 tablet (10 mg total) by mouth daily with breakfast. Take  Xarelto for two and a half more weeks, then discontinue Xarelto. Once the patient has completed the Xarelto, they may resume the 81 mg Aspirin. Patient not taking: Reported on 06/29/2014 03/05/14   Arlee Muslim, PA-C   Physical Exam: Filed Vitals:   06/29/14 0530 06/29/14 0545 06/29/14 0630 06/29/14 0712  BP:    148/82  Pulse: 88 85  91 94  Temp:    98.3 F (36.8 C)  TempSrc:    Oral  Resp: 17 22 22    Height:    5\' 8"  (1.727 m)  Weight:    78.019 kg (172 lb)  SpO2: 93% 90% 93% 94%    Wt Readings from Last 3 Encounters:  06/29/14 78.019 kg (172 lb)  03/11/14 84.369 kg (186 lb)  03/03/14 83.008 kg (183 lb)    General:  Appears ill, complaining of nausea during my encounter. He is awake, alert, no acute distress Eyes: PERRL, normal lids, irises & conjunctiva ENT: grossly normal hearing, lips & tongue Neck: no LAD, masses or thyromegaly Cardiovascular: RRR, no m/r/g. No LE edema. Telemetry: SR, no arrhythmias  Respiratory: CTA bilaterally, no w/r/r. Normal respiratory effort. Abdomen: His abdomen is distended with presence of mild generalized tenderness to palpation. Bowel sounds were hypoactive. Skin: no rash or induration seen on limited exam Musculoskeletal: grossly normal tone BUE/BLE Psychiatric: grossly normal mood and affect, speech fluent and appropriate Neurologic: grossly non-focal.          Labs on Admission:  Basic Metabolic Panel:  Recent Labs Lab 06/29/14 0335 06/29/14 0502  NA 137 142  K 3.8 4.3  CL 104 103  CO2 23  --   GLUCOSE 166* 167*  BUN 20 31*  CREATININE 1.14 1.00  CALCIUM 9.2  --    Liver Function Tests:  Recent Labs Lab 06/29/14 0335  AST 35  ALT 30  ALKPHOS 72  BILITOT 0.7  PROT 6.8  ALBUMIN 4.1    Recent Labs Lab 06/29/14 0335  LIPASE 33   No results for input(s): AMMONIA in the last 168 hours. CBC:  Recent Labs Lab 06/29/14 0335 06/29/14 0502  WBC 10.9*  --   NEUTROABS 7.7  --   HGB 16.1 15.3  HCT 45.6 45.0  MCV  87.2  --   PLT 228  --    Cardiac Enzymes: No results for input(s): CKTOTAL, CKMB, CKMBINDEX, TROPONINI in the last 168 hours.  BNP (last 3 results) No results for input(s): PROBNP in the last 8760 hours. CBG: No results for input(s): GLUCAP in the last 168 hours.  Radiological Exams on Admission: Ct Abdomen Pelvis W Contrast  06/29/2014   CLINICAL DATA:  Acute onset of epigastric abdominal pain and vomiting. Diaphoresis. Initial encounter.  EXAM: CT ABDOMEN AND PELVIS WITH CONTRAST  TECHNIQUE: Multidetector CT imaging of the abdomen and pelvis was performed using the standard protocol following bolus administration of intravenous contrast.  CONTRAST:  38mL OMNIPAQUE IOHEXOL 300 MG/ML SOLN, 139mL OMNIPAQUE IOHEXOL 300 MG/ML SOLN  COMPARISON:  Abdominal radiographs performed 10/21/2007  FINDINGS: The visualized lung bases are clear.  The liver and spleen are unremarkable in appearance. The gallbladder is within normal limits. The pancreas and adrenal glands are unremarkable.  Mild nonspecific perinephric stranding is noted bilaterally. Several left renal cysts are seen, measuring up to 2.8 cm in size. There is no evidence of hydronephrosis. No renal or ureteral stones are seen. No perinephric stranding is appreciated.  There is mild dilatation of visualized small bowel loops to the level of the distal ileum, where there is mild fecalization and gradual decompression of the small bowel. Small bowel loops measure up to 3.2 cm in diameter. No focal transition point is seen. There is mild fatty infiltration of the wall of the distal ileum, concerning for sequelae of chronic inflammation. The patient's ileocolic anastomosis at the right mid abdomen is unremarkable in appearance.  No free fluid is identified. The stomach is within normal limits. No acute vascular abnormalities are seen. Mild scattered calcification is seen along the abdominal aorta and its branches. A circumaortic left renal vein is noted.   The remaining colon is mostly decompressed. Minimal diverticulosis is noted along the descending and proximal sigmoid colon, without evidence of diverticulitis.  The bladder is mildly distended and grossly unremarkable. The prostate is enlarged, measuring 5.4 cm in transverse dimension. No inguinal lymphadenopathy is seen.  No acute osseous abnormalities are identified. Facet disease is noted at the lower lumbar spine.  IMPRESSION: 1. Mild dilatation of small bowel loops to the level of the distal ileum, where there is mild fecalization and gradual decompression of small bowel. No focal transition point seen. However, this could reflect a partial small bowel obstruction due to an adhesion. Small bowel loops measure up to 3.2 cm in diameter. 2. Mild fatty infiltration of the wall of the distal ileum, concerning for sequelae of chronic inflammation. Ileocolic anastomosis is unremarkable in appearance. 3. Minimal diverticulosis along the descending and proximal sigmoid colon, without evidence of diverticulitis. 4. Left renal cysts seen. 5. Enlarged prostate noted.   Electronically Signed   By: Garald Balding M.D.   On: 06/29/2014 05:50    EKG: Independently reviewed.   Assessment/Plan Principal Problem:   SBO (small bowel obstruction) Active Problems:   Abdominal pain, epigastric   Nausea with vomiting   OA (osteoarthritis) of knee   H/O hemicolectomy   1. Small bowel obstruction. A shunt having a history of hemicolectomy in setting of colon cancer, presenting with complaints of abdominal pain associated with distention. CT scan of abdomen and pelvis with IV contrast in the emergency room showing evidence of partial small bowel obstruction. Given history of surgery I suspect adhesion as a cause of partial small bowel obstruction. General surgery has been consulted. Will keep patient nothing by mouth, provide as needed narcotic IV analgesics for severe pain, IV fluid resuscitation, place NG tube. Will  treat conservatively and monitor. Hopefully will resolve.  2. Nausea/vomiting. Likely secondary to partial small bowel obstruction. NG tube to be placed for decompression 3. History of colon cancer, status post hemicolectomy. Suspect adhesions likely contributing to development of partial small bowel obstruction. CT scan of abdomen and pelvis performed in the emergency room showing findings consistent with small bowel obstruction. No definite evidence of recurrence.  4. DVT prophylaxis subcutaneous heparin   Code Status: Full code Family Communication: Family not present Disposition Plan: Anticipate patient will require greater than 2 nights hospitalization, limit to inpatient service  Time spent: 65 minutes  Kelvin Cellar [facility] Pager [phone]

## 2014-06-29 NOTE — ED Notes (Signed)
MD at bedside. 

## 2014-06-30 ENCOUNTER — Inpatient Hospital Stay (HOSPITAL_COMMUNITY): Payer: Medicare Other

## 2014-06-30 LAB — COMPREHENSIVE METABOLIC PANEL
ALK PHOS: 54 U/L (ref 39–117)
ALT: 22 U/L (ref 0–53)
AST: 24 U/L (ref 0–37)
Albumin: 3.3 g/dL — ABNORMAL LOW (ref 3.5–5.2)
Anion gap: 7 (ref 5–15)
BUN: 20 mg/dL (ref 6–23)
CALCIUM: 8.5 mg/dL (ref 8.4–10.5)
CO2: 30 mmol/L (ref 19–32)
Chloride: 105 mEq/L (ref 96–112)
Creatinine, Ser: 1.13 mg/dL (ref 0.50–1.35)
GFR calc non Af Amer: 64 mL/min — ABNORMAL LOW (ref >90)
GFR, EST AFRICAN AMERICAN: 74 mL/min — AB (ref >90)
Glucose, Bld: 154 mg/dL — ABNORMAL HIGH (ref 70–99)
POTASSIUM: 4.2 mmol/L (ref 3.5–5.1)
Sodium: 142 mmol/L (ref 135–145)
Total Bilirubin: 1 mg/dL (ref 0.3–1.2)
Total Protein: 5.9 g/dL — ABNORMAL LOW (ref 6.0–8.3)

## 2014-06-30 LAB — CBC
HCT: 41.4 % (ref 39.0–52.0)
Hemoglobin: 14.4 g/dL (ref 13.0–17.0)
MCH: 31 pg (ref 26.0–34.0)
MCHC: 34.8 g/dL (ref 30.0–36.0)
MCV: 89 fL (ref 78.0–100.0)
Platelets: 188 10*3/uL (ref 150–400)
RBC: 4.65 MIL/uL (ref 4.22–5.81)
RDW: 13.2 % (ref 11.5–15.5)
WBC: 7.3 10*3/uL (ref 4.0–10.5)

## 2014-06-30 MED ORDER — PANTOPRAZOLE SODIUM 40 MG IV SOLR
40.0000 mg | Freq: Two times a day (BID) | INTRAVENOUS | Status: DC
  Administered 2014-06-30 – 2014-07-03 (×7): 40 mg via INTRAVENOUS
  Filled 2014-06-30 (×8): qty 40

## 2014-06-30 MED ORDER — PHENOL 1.4 % MT LIQD
1.0000 | OROMUCOSAL | Status: DC | PRN
  Filled 2014-06-30: qty 177

## 2014-06-30 NOTE — Progress Notes (Signed)
Pt transferred to 3W, room 1340. Report given to RN on unit.

## 2014-06-30 NOTE — Progress Notes (Signed)
[facility] PROGRESS NOTE  George [name] FTD:[id number] DOB: [birth year] DOA: 06/29/2014  PCP: [name], MD  Brief HPI: 74 year old Caucasian male with a past medical history of colon cancer, status post hemicolectomy presented with nausea, vomiting, abdominal pain. He is thought to have partial small bowel obstruction. He was admitted to the hospital for further management.  Past medical history:  Past Medical History  Diagnosis Date  . Arthritis   . 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 Small bowel obstruction     Consultants: Gen. surgery  Procedures: NG tube placement  Antibiotics: None  Subjective: Patient's pain is slightly better. The NG tube is bothering his throat.  Objective: Vital Signs  Filed Vitals:   06/29/14 1419 06/29/14 1826 06/29/14 2217 06/30/14 0420  BP: 123/81 115/70 123/63 127/70  Pulse: 113 114 104 101  Temp: 99 F (37.2 C) 99.6 F (37.6 C) 97.3 F (36.3 C) 99.3 F (37.4 C)  TempSrc: Oral Oral Oral Oral  Resp: 20 18 16 18   Height:      Weight:      SpO2: 93% 94% 94% 97%    Intake/Output Summary (Last 24 hours) at 06/30/14 0623 Last data filed at 06/30/14 0809  Gross per 24 hour  Intake 2654.58 ml  Output   1550 ml  Net 1104.58 ml   Filed Weights   06/29/14 7628  Weight: 78.019 kg (172 lb)    General appearance: alert, cooperative, appears stated age and no distress Resp: clear to auscultation bilaterally Cardio: regular rate and rhythm, S1, S2 normal, no murmur, click, rub or gallop GI: Distended. No bowel sounds present. Slightly tender diffusely without any rebound, rigidity or guarding. No masses or organomegaly. Extremities: extremities normal, atraumatic, no cyanosis or edema  Lab Results:  Basic Metabolic Panel:  Recent Labs Lab 06/29/14 0335 06/29/14 0502 06/30/14 0544  NA 137 142 142  K 3.8 4.3 4.2  CL 104 103 105  CO2 23  --  30   GLUCOSE 166* 167* 154*  BUN 20 31* 20  CREATININE 1.14 1.00 1.13  CALCIUM 9.2  --  8.5   Liver Function Tests:  Recent Labs Lab 06/29/14 0335 06/30/14 0544  AST 35 24  ALT 30 22  ALKPHOS 72 54  BILITOT 0.7 1.0  PROT 6.8 5.9*  ALBUMIN 4.1 3.3*    Recent Labs Lab 06/29/14 0335  LIPASE 33   CBC:  Recent Labs Lab 06/29/14 0335 06/29/14 0502 06/30/14 0544  WBC 10.9*  --  7.3  NEUTROABS 7.7  --   --   HGB 16.1 15.3 14.4  HCT 45.6 45.0 41.4  MCV 87.2  --  89.0  PLT 228  --  188     Studies/Results: Ct Abdomen Pelvis W Contrast  06/29/2014   CLINICAL DATA:  Acute onset of epigastric abdominal pain and vomiting. Diaphoresis. Initial encounter.  EXAM: CT ABDOMEN AND PELVIS WITH CONTRAST  TECHNIQUE: Multidetector CT imaging of the abdomen and pelvis was performed using the standard protocol following bolus administration of intravenous contrast.  CONTRAST:  70mL OMNIPAQUE IOHEXOL 300 MG/ML SOLN, 122mL OMNIPAQUE IOHEXOL 300 MG/ML SOLN  COMPARISON:  Abdominal radiographs performed 10/21/2007  FINDINGS: The visualized lung bases are clear.  The liver and spleen are unremarkable in appearance. The gallbladder is within normal limits. The pancreas and adrenal glands are unremarkable.  Mild nonspecific perinephric stranding is noted bilaterally. Several left  renal cysts are seen, measuring up to 2.8 cm in size. There is no evidence of hydronephrosis. No renal or ureteral stones are seen. No perinephric stranding is appreciated.  There is mild dilatation of visualized small bowel loops to the level of the distal ileum, where there is mild fecalization and gradual decompression of the small bowel. Small bowel loops measure up to 3.2 cm in diameter. No focal transition point is seen. There is mild fatty infiltration of the wall of the distal ileum, concerning for sequelae of chronic inflammation. The patient's ileocolic anastomosis at the right mid abdomen is unremarkable in appearance.  No  free fluid is identified. The stomach is within normal limits. No acute vascular abnormalities are seen. Mild scattered calcification is seen along the abdominal aorta and its branches. A circumaortic left renal vein is noted.  The remaining colon is mostly decompressed. Minimal diverticulosis is noted along the descending and proximal sigmoid colon, without evidence of diverticulitis.  The bladder is mildly distended and grossly unremarkable. The prostate is enlarged, measuring 5.4 cm in transverse dimension. No inguinal lymphadenopathy is seen.  No acute osseous abnormalities are identified. Facet disease is noted at the lower lumbar spine.  IMPRESSION: 1. Mild dilatation of small bowel loops to the level of the distal ileum, where there is mild fecalization and gradual decompression of small bowel. No focal transition point seen. However, this could reflect a partial small bowel obstruction due to an adhesion. Small bowel loops measure up to 3.2 cm in diameter. 2. Mild fatty infiltration of the wall of the distal ileum, concerning for sequelae of chronic inflammation. Ileocolic anastomosis is unremarkable in appearance. 3. Minimal diverticulosis along the descending and proximal sigmoid colon, without evidence of diverticulitis. 4. Left renal cysts seen. 5. Enlarged prostate noted.   Electronically Signed   By: Garald Balding M.D.   On: 06/29/2014 05:50   Dg Loyce Dys Tube Plc W/fl W/rad  06/29/2014   CLINICAL DATA:  Nasogastric tube needed for bowel decompression. Unsuccessful attempt on the floor.  EXAM: PLACEMENT OF NASOGASTRIC TUBE WITH FLUOROSCOPY  Physician: Stephan Minister. Anselm Pancoast, MD  FLUOROSCOPY TIME:  12 seconds  MEDICATIONS: None  ANESTHESIA/SEDATION: Moderate sedation time: None  PROCEDURE: A 18 French nasogastric tube was placed through the left nostril. The tube was easily advanced into the esophagus and stomach with fluoroscopy. Nasogastric tube was placed in the gastric body region. Tube was secured to the  skin.  FINDINGS: Tip of feeding tube in the left upper quadrant, presumed to be in the stomach body region.  COMPLICATIONS: None  IMPRESSION: Successful placement of a nasogastric tube with fluoroscopy.   Electronically Signed   By: Markus Daft M.D.   On: 06/29/2014 10:50    Medications:  Scheduled: . heparin  5,000 Units Subcutaneous 3 times per day  . pantoprazole (PROTONIX) IV  40 mg Intravenous Q12H   Continuous: . dextrose 5 % and 0.45 % NaCl with KCl 20 mEq/L 125 mL/hr (06/30/14 0419)   MEQ:[id number] **OR** acetaminophen, morphine injection, ondansetron **OR** ondansetron (ZOFRAN) IV, oxyCODONE, phenol  Assessment/Plan:  Principal Problem:   SBO (small bowel obstruction) Active Problems:   OA (osteoarthritis) of knee   Abdominal pain, epigastric   Nausea with vomiting   H/O hemicolectomy    Small bowel obstruction Management per general surgery. Patient appears to be stable. NG tube in place. Continue IV fluids.  History of colon cancer, status post hemicolectomy Suspect adhesions likely contributing to development of partial small bowel obstruction.  His home medication list shows Xarelto. I discussed with the patient. He states that he was on it after his knee replacement in September but not anymore. I have requested pharmacy to reconcile his medication list once more.  DVT Prophylaxis: Heparin    Code Status: Full code  Family Communication: Discussed with the patient  Disposition Plan: Likely return home when improved. Mobilize.   LOS: 1 day   [facility] Pager [phone] 06/30/2014, 9:09 AM  If 7PM-7AM, please contact night-coverage at [email or web address], password [facility]

## 2014-06-30 NOTE — Progress Notes (Signed)
Subjective: He is very distended and still having pain.  The NG was not working well and we have fixed that.  Drainage is a little coffee ground so I will add PPI.  The drainage is not thick.    Objective: Vital signs in last 24 hours: Temp:  [97.3 F (36.3 C)-99.6 F (37.6 C)] 99.3 F (37.4 C) (01/18 0420) Pulse Rate:  [101-114] 101 (01/18 0420) Resp:  [16-20] 18 (01/18 0420) BP: (115-127)/(63-81) 127/70 mmHg (01/18 0420) SpO2:  [93 %-97 %] 97 % (01/18 0420) Last BM Date: 06/28/14 1300 from the NG, nurse reports it is thick and needs frequent irrigation to maintain flow Afebrile, HR 100 range, BP OK Labs OK NG placed by IR CT Yesterday:  1. Mild dilatation of small bowel loops to the level of the distal ileum, where there is mild fecalization and gradual decompression of small bowel. No focal transition point seen. However, this could reflect a partial small bowel obstruction due to an adhesion. Small bowel loops measure up to 3.2 cm in diameter. 2. Mild fatty infiltration of the wall of the distal ileum, concerning for sequelae of chronic inflammation. Ileocolic anastomosis is unremarkable in appearance. 3. Minimal diverticulosis along the descending and proximal sigmoid colon, without evidence of diverticulitis.  Intake/Output from previous day: 01/17 0701 - 01/18 0700 In: 2554.6 [I.V.:2414.6; NG/GT:140] Out: 1750 [Urine:450; Emesis/NG output:1300] Intake/Output this shift:    General appearance: alert, cooperative and ongoing discomfort with abdominal distension.  No relief so far. GI: Very distended, rare BS and high pitched, very uncomfortable, no peritonitis at this point.  Lab Results:   Recent Labs  06/29/14 0335 06/29/14 0502 06/30/14 0544  WBC 10.9*  --  7.3  HGB 16.1 15.3 14.4  HCT 45.6 45.0 41.4  PLT 228  --  188    BMET  Recent Labs  06/29/14 0335 06/29/14 0502 06/30/14 0544  NA 137 142 142  K 3.8 4.3 4.2  CL 104 103 105  CO2 23  --  30   GLUCOSE 166* 167* 154*  BUN 20 31* 20  CREATININE 1.14 1.00 1.13  CALCIUM 9.2  --  8.5   PT/INR  Recent Labs  06/29/14 0412  LABPROT 12.3  INR 0.90     Recent Labs Lab 06/29/14 0335 06/30/14 0544  AST 35 24  ALT 30 22  ALKPHOS 72 54  BILITOT 0.7 1.0  PROT 6.8 5.9*  ALBUMIN 4.1 3.3*     Lipase     Component Value Date/Time   LIPASE 33 06/29/2014 0335     Studies/Results: Ct Abdomen Pelvis W Contrast  06/29/2014   CLINICAL DATA:  Acute onset of epigastric abdominal pain and vomiting. Diaphoresis. Initial encounter.  EXAM: CT ABDOMEN AND PELVIS WITH CONTRAST  TECHNIQUE: Multidetector CT imaging of the abdomen and pelvis was performed using the standard protocol following bolus administration of intravenous contrast.  CONTRAST:  48mL OMNIPAQUE IOHEXOL 300 MG/ML SOLN, 141mL OMNIPAQUE IOHEXOL 300 MG/ML SOLN  COMPARISON:  Abdominal radiographs performed 10/21/2007  FINDINGS: The visualized lung bases are clear.  The liver and spleen are unremarkable in appearance. The gallbladder is within normal limits. The pancreas and adrenal glands are unremarkable.  Mild nonspecific perinephric stranding is noted bilaterally. Several left renal cysts are seen, measuring up to 2.8 cm in size. There is no evidence of hydronephrosis. No renal or ureteral stones are seen. No perinephric stranding is appreciated.  There is mild dilatation of visualized small bowel loops to the level of  the distal ileum, where there is mild fecalization and gradual decompression of the small bowel. Small bowel loops measure up to 3.2 cm in diameter. No focal transition point is seen. There is mild fatty infiltration of the wall of the distal ileum, concerning for sequelae of chronic inflammation. The patient's ileocolic anastomosis at the right mid abdomen is unremarkable in appearance.  No free fluid is identified. The stomach is within normal limits. No acute vascular abnormalities are seen. Mild scattered  calcification is seen along the abdominal aorta and its branches. A circumaortic left renal vein is noted.  The remaining colon is mostly decompressed. Minimal diverticulosis is noted along the descending and proximal sigmoid colon, without evidence of diverticulitis.  The bladder is mildly distended and grossly unremarkable. The prostate is enlarged, measuring 5.4 cm in transverse dimension. No inguinal lymphadenopathy is seen.  No acute osseous abnormalities are identified. Facet disease is noted at the lower lumbar spine.  IMPRESSION: 1. Mild dilatation of small bowel loops to the level of the distal ileum, where there is mild fecalization and gradual decompression of small bowel. No focal transition point seen. However, this could reflect a partial small bowel obstruction due to an adhesion. Small bowel loops measure up to 3.2 cm in diameter. 2. Mild fatty infiltration of the wall of the distal ileum, concerning for sequelae of chronic inflammation. Ileocolic anastomosis is unremarkable in appearance. 3. Minimal diverticulosis along the descending and proximal sigmoid colon, without evidence of diverticulitis. 4. Left renal cysts seen. 5. Enlarged prostate noted.   Electronically Signed   By: Garald Balding M.D.   On: 06/29/2014 05:50   Dg Loyce Dys Tube Plc W/fl W/rad  06/29/2014   CLINICAL DATA:  Nasogastric tube needed for bowel decompression. Unsuccessful attempt on the floor.  EXAM: PLACEMENT OF NASOGASTRIC TUBE WITH FLUOROSCOPY  Physician: Stephan Minister. Anselm Pancoast, MD  FLUOROSCOPY TIME:  12 seconds  MEDICATIONS: None  ANESTHESIA/SEDATION: Moderate sedation time: None  PROCEDURE: A 18 French nasogastric tube was placed through the left nostril. The tube was easily advanced into the esophagus and stomach with fluoroscopy. Nasogastric tube was placed in the gastric body region. Tube was secured to the skin.  FINDINGS: Tip of feeding tube in the left upper quadrant, presumed to be in the stomach body region.   COMPLICATIONS: None  IMPRESSION: Successful placement of a nasogastric tube with fluoroscopy.   Electronically Signed   By: Markus Daft M.D.   On: 06/29/2014 10:50    Medications: . heparin  5,000 Units Subcutaneous 3 times per day   . dextrose 5 % and 0.45 % NaCl with KCl 20 mEq/L 125 mL/hr (06/30/14 0419)   Prior to Admission medications   Medication Sig Start Date End Date Taking? Authorizing Provider  aspirin 81 MG chewable tablet Chew 81 mg by mouth daily.   Yes Historical Provider, MD  buPROPion (WELLBUTRIN XL) 150 MG 24 hr tablet Take 450 mg by mouth every morning.   Yes Historical Provider, MD  celecoxib (CELEBREX) 200 MG capsule Take 200 mg by mouth daily.   Yes Historical Provider, MD  citalopram (CELEXA) 20 MG tablet Take 20 mg by mouth at bedtime.   Yes Historical Provider, MD  doxazosin (CARDURA) 8 MG tablet Take 8 mg by mouth at bedtime.   Yes Historical Provider, MD  finasteride (PROSCAR) 5 MG tablet Take 5 mg by mouth every morning.   Yes Historical Provider, MD  Multiple Vitamin (MULTIVITAMIN WITH MINERALS) TABS tablet Take 1 tablet by  mouth daily.   Yes Historical Provider, MD     Assessment/Plan SBO, with prior hx of hemicolectomy  Prior SBO with about 4 day hospitalization. 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  If he does not improve he may need surgery. I have ordered films for now and tomorrow, along with labs for the AM.     LOS: 1 day    George [name] 06/30/2014

## 2014-07-01 ENCOUNTER — Inpatient Hospital Stay (HOSPITAL_COMMUNITY): Payer: Medicare Other

## 2014-07-01 DIAGNOSIS — E876 Hypokalemia: Secondary | ICD-10-CM

## 2014-07-01 LAB — CBC
HCT: 39.7 % (ref 39.0–52.0)
HEMOGLOBIN: 13.5 g/dL (ref 13.0–17.0)
MCH: 30.3 pg (ref 26.0–34.0)
MCHC: 34 g/dL (ref 30.0–36.0)
MCV: 89 fL (ref 78.0–100.0)
PLATELETS: 167 10*3/uL (ref 150–400)
RBC: 4.46 MIL/uL (ref 4.22–5.81)
RDW: 12.9 % (ref 11.5–15.5)
WBC: 5.9 10*3/uL (ref 4.0–10.5)

## 2014-07-01 LAB — BASIC METABOLIC PANEL
ANION GAP: 6 (ref 5–15)
BUN: 12 mg/dL (ref 6–23)
CHLORIDE: 106 meq/L (ref 96–112)
CO2: 28 mmol/L (ref 19–32)
Calcium: 8.4 mg/dL (ref 8.4–10.5)
Creatinine, Ser: 1 mg/dL (ref 0.50–1.35)
GFR calc Af Amer: 85 mL/min — ABNORMAL LOW (ref >90)
GFR, EST NON AFRICAN AMERICAN: 74 mL/min — AB (ref >90)
Glucose, Bld: 122 mg/dL — ABNORMAL HIGH (ref 70–99)
POTASSIUM: 3.4 mmol/L — AB (ref 3.5–5.1)
Sodium: 140 mmol/L (ref 135–145)

## 2014-07-01 MED ORDER — HYDROCOD POLST-CHLORPHEN POLST 10-8 MG/5ML PO LQCR
5.0000 mL | Freq: Once | ORAL | Status: DC
Start: 2014-07-01 — End: 2014-07-01

## 2014-07-01 MED ORDER — POTASSIUM CHLORIDE 10 MEQ/100ML IV SOLN
10.0000 meq | INTRAVENOUS | Status: AC
  Administered 2014-07-01 (×3): 10 meq via INTRAVENOUS
  Filled 2014-07-01 (×3): qty 100

## 2014-07-01 MED ORDER — KCL IN DEXTROSE-NACL 40-5-0.45 MEQ/L-%-% IV SOLN
INTRAVENOUS | Status: DC
  Administered 2014-07-01 – 2014-07-02 (×2): 1000 mL via INTRAVENOUS
  Filled 2014-07-01 (×4): qty 1000

## 2014-07-01 NOTE — Progress Notes (Signed)
[facility] PROGRESS NOTE  George [name] EYC:[id number] DOB: February 11, 1941 DOA: 06/29/2014  PCP: [name], MD  Brief HPI: 74 year old Caucasian male with a past medical history of colon cancer, status post hemicolectomy presented with nausea, vomiting, abdominal pain. He is thought to have partial small bowel obstruction. He was admitted to the hospital for further management.  Past medical history:  Past Medical History  Diagnosis Date  . Arthritis   . 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 Small bowel obstruction     Consultants: Gen. surgery  Procedures: NG tube placement  Antibiotics: None  Subjective: Patient feels much better. Passing gas. No bowel movements yet. Denies any abdominal pain currently.  Objective: Vital Signs  Filed Vitals:   07/30/14 0420 07-30-14 1422 July 30, 2014 2014 07/01/14 0454  BP: 127/70 133/72 145/75 146/74  Pulse: 101 93 93 93  Temp: 99.3 F (37.4 C) 98 F (36.7 C) 99.4 F (37.4 C) 98.3 F (36.8 C)  TempSrc: Oral Oral Oral Oral  Resp: 18 16 18 18   Height:  5\' 8"  (1.727 m)    Weight:  78.019 kg (172 lb)    SpO2: 97% 96% 98% 99%    Intake/Output Summary (Last 24 hours) at 07/01/14 0727 Last data filed at 07/01/14 0533  Gross per 24 hour  Intake 3444.17 ml  Output   1375 ml  Net 2069.17 ml   Filed Weights   06/29/14 1497 30-Jul-2014 1422  Weight: 78.019 kg (172 lb) 78.019 kg (172 lb)    General appearance: alert, cooperative, appears stated age and no distress Resp: clear to auscultation bilaterally Cardio: regular rate and rhythm, S1, S2 normal, no murmur, click, rub or gallop GI: Less distended today. Bowel sounds present today. Nontender. No masses or organomegaly. Extremities: extremities normal, atraumatic, no cyanosis or edema  Lab Results:  Basic Metabolic Panel:  Recent Labs Lab 06/29/14 0335 06/29/14 0502 07-30-2014 0544  NA 137 142 142    K 3.8 4.3 4.2  CL 104 103 105  CO2 23  --  30  GLUCOSE 166* 167* 154*  BUN 20 31* 20  CREATININE 1.14 1.00 1.13  CALCIUM 9.2  --  8.5   Liver Function Tests:  Recent Labs Lab 06/29/14 0335 07/30/14 0544  AST 35 24  ALT 30 22  ALKPHOS 72 54  BILITOT 0.7 1.0  PROT 6.8 5.9*  ALBUMIN 4.1 3.3*    Recent Labs Lab 06/29/14 0335  LIPASE 33   CBC:  Recent Labs Lab 06/29/14 0335 06/29/14 0502 07-30-2014 0544 07/01/14 0510  WBC 10.9*  --  7.3 5.9  NEUTROABS 7.7  --   --   --   HGB 16.1 15.3 14.4 13.5  HCT 45.6 45.0 41.4 39.7  MCV 87.2  --  89.0 89.0  PLT 228  --  188 167     Studies/Results: Dg Abd 2 Views  30-Jul-2014   CLINICAL DATA:  Small-bowel obstruction  EXAM: ABDOMEN - 2 VIEW  COMPARISON:  06/29/2014  FINDINGS: A nasogastric catheter is now seen within the stomach. Persistent and slightly increased small bowel dilatation is noted. Scattered gas is noted within the colon suggesting a partial obstruction. Postsurgical changes are noted in the right lower quadrant.  IMPRESSION: Increased small bowel dilatation when compared with the prior exam.   Electronically Signed   By: Inez Catalina M.D.   On: 2014-07-30 09:36   Dg Addison Bailey  G Tube Plc W/fl W/rad  06/29/2014   CLINICAL DATA:  Nasogastric tube needed for bowel decompression. Unsuccessful attempt on the floor.  EXAM: PLACEMENT OF NASOGASTRIC TUBE WITH FLUOROSCOPY  Physician: Stephan Minister. Anselm Pancoast, MD  FLUOROSCOPY TIME:  12 seconds  MEDICATIONS: None  ANESTHESIA/SEDATION: Moderate sedation time: None  PROCEDURE: A 18 French nasogastric tube was placed through the left nostril. The tube was easily advanced into the esophagus and stomach with fluoroscopy. Nasogastric tube was placed in the gastric body region. Tube was secured to the skin.  FINDINGS: Tip of feeding tube in the left upper quadrant, presumed to be in the stomach body region.  COMPLICATIONS: None  IMPRESSION: Successful placement of a nasogastric tube with fluoroscopy.    Electronically Signed   By: Markus Daft M.D.   On: 06/29/2014 10:50    Medications:  Scheduled: . heparin  5,000 Units Subcutaneous 3 times per day  . pantoprazole (PROTONIX) IV  40 mg Intravenous Q12H   Continuous: . dextrose 5 % and 0.45 % NaCl with KCl 20 mEq/L 125 mL/hr at 07/01/14 0533   IOE:[id number] **OR** acetaminophen, morphine injection, ondansetron **OR** ondansetron (ZOFRAN) IV, oxyCODONE, phenol  Assessment/Plan:  Principal Problem:   SBO (small bowel obstruction) Active Problems:   OA (osteoarthritis) of knee   Abdominal pain, epigastric   Nausea with vomiting   H/O hemicolectomy    Small bowel obstruction Management per general surgery. Patient appears to be improving. NG tube in place. Continue IV fluids.  History of colon cancer, status post hemicolectomy Suspect adhesions likely contributing to development of partial small bowel obstruction.   Replace potassium.  His home medication list showed Xarelto. I discussed with the patient. He states that he was on it after his knee replacement in September but not anymore.  DVT Prophylaxis: Heparin    Code Status: Full code  Family Communication: Discussed with the patient  Disposition Plan: Likely return home when improved. Mobilize.   LOS: 2 days   [facility] Pager [phone] 07/01/2014, 7:27 AM  If 7PM-7AM, please contact night-coverage at [email or web address], password [facility]

## 2014-07-01 NOTE — Progress Notes (Signed)
Subjective: He feels better and is having more flatus.  Stomach is a little less distended, not as tight as yesterday.  Objective: Vital signs in last 24 hours: Temp:  [98 F (36.7 C)-99.4 F (37.4 C)] 98.3 F (36.8 C) 07-18-2022 0454) Pulse Rate:  [93] 93 07/18/2022 0454) Resp:  [16-18] 18 July 18, 2022 0454) BP: (133-146)/(72-75) 146/74 mmHg 2022-07-18 0454) SpO2:  [96 %-99 %] 99 % July 18, 2022 0454) Weight:  [78.019 kg (172 lb)] 78.019 kg (172 lb) (01/18 1422) Last BM Date: 06/28/14 NPO 500 from NG recorded. Afebrile, VSS K+ 3.4 WBC is normal Film today is better. Intake/Output from previous day: 01/18 0701 - 2022/07/18 0700 In: 3444.2 [I.V.:3154.2; NG/GT:290] Out: 1375 [Urine:875; Emesis/NG output:500] Intake/Output this shift:    General appearance: alert, cooperative and no distress GI: softer, less distended than yesterday, having flatus, no BM  Lab Results:   Recent Labs  06/30/14 0544 07-18-2014 0510  WBC 7.3 5.9  HGB 14.4 13.5  HCT 41.4 39.7  PLT 188 167    BMET  Recent Labs  06/30/14 0544 2014/07/18 0510  NA 142 140  K 4.2 3.4*  CL 105 106  CO2 30 28  GLUCOSE 154* 122*  BUN 20 12  CREATININE 1.13 1.00  CALCIUM 8.5 8.4   PT/INR  Recent Labs  06/29/14 0412  LABPROT 12.3  INR 0.90     Recent Labs Lab 06/29/14 0335 06/30/14 0544  AST 35 24  ALT 30 22  ALKPHOS 72 54  BILITOT 0.7 1.0  PROT 6.8 5.9*  ALBUMIN 4.1 3.3*     Lipase     Component Value Date/Time   LIPASE 33 06/29/2014 0335     Studies/Results: Dg Abd 2 Views  07/18/14   CLINICAL DATA:  F/u SBO; patient states his abd is feeling better  EXAM: ABDOMEN - 2 VIEW  COMPARISON:  the previous day's study  FINDINGS: Nasogastric tube remains in the stomach.  No free air.  Several gas distended small bowel loops in the mid abdomen, decreased in degree of dilatation since previous exam. Normal distribution of gas and stool throughout the nondilated colon and rectum.  Bilateral pelvic phleboliths.   Spondylitic changes in the lumbar spine.  IMPRESSION: 1. Decrease in degree of small bowel dilatation since previous day's study.   Electronically Signed   By: Arne Cleveland M.D.   On: 18-Jul-2014 08:40   Dg Abd 2 Views  06/30/2014   CLINICAL DATA:  Small-bowel obstruction  EXAM: ABDOMEN - 2 VIEW  COMPARISON:  06/29/2014  FINDINGS: A nasogastric catheter is now seen within the stomach. Persistent and slightly increased small bowel dilatation is noted. Scattered gas is noted within the colon suggesting a partial obstruction. Postsurgical changes are noted in the right lower quadrant.  IMPRESSION: Increased small bowel dilatation when compared with the prior exam.   Electronically Signed   By: Inez Catalina M.D.   On: 06/30/2014 09:36   Dg Loyce Dys Tube Plc W/fl W/rad  06/29/2014   CLINICAL DATA:  Nasogastric tube needed for bowel decompression. Unsuccessful attempt on the floor.  EXAM: PLACEMENT OF NASOGASTRIC TUBE WITH FLUOROSCOPY  Physician: Stephan Minister. Anselm Pancoast, MD  FLUOROSCOPY TIME:  12 seconds  MEDICATIONS: None  ANESTHESIA/SEDATION: Moderate sedation time: None  PROCEDURE: A 18 French nasogastric tube was placed through the left nostril. The tube was easily advanced into the esophagus and stomach with fluoroscopy. Nasogastric tube was placed in the gastric body region. Tube was secured to the skin.  FINDINGS: Tip  of feeding tube in the left upper quadrant, presumed to be in the stomach body region.  COMPLICATIONS: None  IMPRESSION: Successful placement of a nasogastric tube with fluoroscopy.   Electronically Signed   By: Markus Daft M.D.   On: 06/29/2014 10:50    Medications: . heparin  5,000 Units Subcutaneous 3 times per day  . pantoprazole (PROTONIX) IV  40 mg Intravenous Q12H    Assessment/Plan SBO, with prior hx of hemicolectomy  Prior SBO with about 4 day hospitalization. 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  Replace K+, in IV.    LOS: 2 days    George [name] 07/01/2014

## 2014-07-02 ENCOUNTER — Inpatient Hospital Stay (HOSPITAL_COMMUNITY): Payer: Medicare Other

## 2014-07-02 LAB — BASIC METABOLIC PANEL
Anion gap: 7 (ref 5–15)
BUN: 11 mg/dL (ref 6–23)
CO2: 27 mmol/L (ref 19–32)
Calcium: 8.6 mg/dL (ref 8.4–10.5)
Chloride: 106 mEq/L (ref 96–112)
Creatinine, Ser: 0.84 mg/dL (ref 0.50–1.35)
GFR calc Af Amer: >90 mL/min (ref >90)
GFR, EST NON AFRICAN AMERICAN: 86 mL/min — AB (ref >90)
GLUCOSE: 134 mg/dL — AB (ref 70–99)
POTASSIUM: 3.5 mmol/L (ref 3.5–5.1)
Sodium: 140 mmol/L (ref 135–145)

## 2014-07-02 LAB — CBC
HCT: 41 % (ref 39.0–52.0)
Hemoglobin: 14 g/dL (ref 13.0–17.0)
MCH: 30 pg (ref 26.0–34.0)
MCHC: 34.1 g/dL (ref 30.0–36.0)
MCV: 88 fL (ref 78.0–100.0)
PLATELETS: 182 10*3/uL (ref 150–400)
RBC: 4.66 MIL/uL (ref 4.22–5.81)
RDW: 12.7 % (ref 11.5–15.5)
WBC: 5.8 10*3/uL (ref 4.0–10.5)

## 2014-07-02 NOTE — Progress Notes (Signed)
Patient's NGT discontinued per order,patient tolerated the procedure,no nasal bleeding ,no vomitng noted. Will continue to monitor the patient.- Sandie Ano RN

## 2014-07-02 NOTE — Progress Notes (Signed)
[facility] PROGRESS NOTE  George [name] LNL:[id number] DOB: 1940/10/06 DOA: 06/29/2014  PCP: [name], MD  73 ? h/o basal Cell Ca nose, s/p hemicolectomy 2005, OA, S/p R TKR presented with nausea, vomiting, abdominal pain. He is thought to have partial small bowel obstruction. He was admitted 1/17  Past medical history:  Past Medical History  Diagnosis Date  . Arthritis   . 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 Small bowel obstruction     Consultants: Gen. surgery  Procedures: NG tube placement  Antibiotics: None  Subjective: Patient feels better Tol clears No n/v No CP 2 stools today-1 firm, i reg   Objective: Vital Signs  Filed Vitals:   07/01/14 1436 07/01/14 2141 2014/07/24 0500 Jul 24, 2014 1332  BP: 165/84 132/70 137/80 137/77  Pulse: 85 85 87 81  Temp: 98.4 F (36.9 C) 99.6 F (37.6 C) 98.5 F (36.9 C) 98.4 F (36.9 C)  TempSrc: Oral Oral Oral Oral  Resp: 18 19 18 18   Height:      Weight:      SpO2: 95% 98% 98% 98%    Intake/Output Summary (Last 24 hours) at 07-24-2014 1437 Last data filed at Jul 24, 2014 1343  Gross per 24 hour  Intake    820 ml  Output   2124 ml  Net  -1304 ml   Filed Weights   06/29/14 0712 06/30/14 1422  Weight: 78.019 kg (172 lb) 78.019 kg (172 lb)    General appearance: alert, cooperative, appears stated age and no distress Resp: clear to auscultation bilaterally Cardio: regular rate and rhythm, S1, S2 normal, no murmur, click, rub or gallop GI: Less distended today. Bowel sounds present today. Nontender. No masses or organomegaly.  Lab Results:  Basic Metabolic Panel:  Recent Labs Lab 06/29/14 0335 06/29/14 0502 06/30/14 0544 07/01/14 0510 24-Jul-2014 0534  NA 137 142 142 140 140  K 3.8 4.3 4.2 3.4* 3.5  CL 104 103 105 106 106  CO2 23  --  30 28 27   GLUCOSE 166* 167* 154* 122* 134*  BUN 20 31* 20 12 11   CREATININE 1.14 1.00 1.13 1.00  0.84  CALCIUM 9.2  --  8.5 8.4 8.6   Liver Function Tests:  Recent Labs Lab 06/29/14 0335 06/30/14 0544  AST 35 24  ALT 30 22  ALKPHOS 72 54  BILITOT 0.7 1.0  PROT 6.8 5.9*  ALBUMIN 4.1 3.3*    Recent Labs Lab 06/29/14 0335  LIPASE 33   CBC:  Recent Labs Lab 06/29/14 0335 06/29/14 0502 06/30/14 0544 07/01/14 0510 2014-07-24 0534  WBC 10.9*  --  7.3 5.9 5.8  NEUTROABS 7.7  --   --   --   --   HGB 16.1 15.3 14.4 13.5 14.0  HCT 45.6 45.0 41.4 39.7 41.0  MCV 87.2  --  89.0 89.0 88.0  PLT 228  --  188 167 182     Studies/Results: Dg Abd 2 Views  July 24, 2014   CLINICAL DATA:  Subsequent evaluation of small bowel obstruction  EXAM: ABDOMEN - 2 VIEW  COMPARISON:  07/01/14  FINDINGS: No change NG tube.Oral contrast into the rectum. There remain loops of mildly dilated small bowel, decreased in number and degree. A few scattered air-fluid levels remain stable  IMPRESSION: Low grade incomplete small bowel obstruction improved when compared to 07/01/14.   Electronically Signed   By: Elodia Florence.D.  On: 07/02/2014 09:31   Dg Abd 2 Views  07/01/2014   CLINICAL DATA:  F/u SBO; patient states his abd is feeling better  EXAM: ABDOMEN - 2 VIEW  COMPARISON:  the previous day's study  FINDINGS: Nasogastric tube remains in the stomach.  No free air.  Several gas distended small bowel loops in the mid abdomen, decreased in degree of dilatation since previous exam. Normal distribution of gas and stool throughout the nondilated colon and rectum.  Bilateral pelvic phleboliths.  Spondylitic changes in the lumbar spine.  IMPRESSION: 1. Decrease in degree of small bowel dilatation since previous day's study.   Electronically Signed   By: Arne Cleveland M.D.   On: 07/01/2014 08:40    Medications:  Scheduled: . heparin  5,000 Units Subcutaneous 3 times per day  . pantoprazole (PROTONIX) IV  40 mg Intravenous Q12H   Continuous: . dextrose 5 % and 0.45 % NaCl with KCl 40 mEq/L 1,000 mL  (07/02/14 0942)   VQQ:[id number] **OR** acetaminophen, morphine injection, ondansetron **OR** ondansetron (ZOFRAN) IV, oxyCODONE, phenol  Assessment/Plan:  Principal Problem:   SBO (small bowel obstruction) Active Problems:   OA (osteoarthritis) of knee   Abdominal pain, epigastric   Nausea with vomiting   H/O hemicolectomy    Small bowel obstruction Management per general surgery. Patient appears to be improving. NG tube in place. Continue IV fluids.  History of colon cancer, status post hemicolectomy Suspect adhesions likely contributing to development of partial small bowel obstruction.   Replace potassium.  His home medication list showed Xarelto. I discussed with the patient. He states that he was on it after his knee replacement in September but not anymore.  DVT Prophylaxis: Heparin    Code Status: Full code  Family Communication: Discussed with the patient  Disposition Plan: Likely return home when improved. Mobilize.   LOS: 3 days   Cordney Barstow, JAI-GURMUKH   Verneita Griffes, MD [facility] [phone]

## 2014-07-02 NOTE — Progress Notes (Signed)
Patient resting in room.  Patient states that he has spoken to several family members today.  Ambulating well to bathroom on his own, but will call staff if he needs assistance.  Breath sounds clear, bowel sounds active, respiratory pattern normal with no signs of distress noted.  No edema noted in all extremities.  Patient preparing to take shower.  Patient states no complaints of pain.

## 2014-07-02 NOTE — Progress Notes (Signed)
  Subjective: He feels better and is having more flatus and BM's.  Stomach is a little less distended, not as tight as yesterday.  Feels more normal  Objective: Vital signs in last 24 hours: Temp:  [98.4 F (36.9 C)-99.6 F (37.6 C)] 98.5 F (36.9 C) July 09, 2022 0500) Pulse Rate:  [85-87] 87 07/09/22 0500) Resp:  [18-19] 18 2022/07/09 0500) BP: (132-165)/(70-84) 137/80 mmHg July 09, 2022 0500) SpO2:  [95 %-98 %] 98 % 07-09-2022 0500) Last BM Date: 06/28/14  WBC is normal Film today is better.  Contrast in colon. Intake/Output from previous day: 01/19 0701 - July 09, 2022 0700 In: 403.3 [I.V.:403.3] Out: 2720 [Urine:2400; Emesis/NG output:320] Intake/Output this shift: Total I/O In: -  Out: 201 [Urine:200; Stool:1]  General appearance: alert, cooperative and no distress GI: softer, less distended   Lab Results:   Recent Labs  07/01/14 0510 2014-07-09 0534  WBC 5.9 5.8  HGB 13.5 14.0  HCT 39.7 41.0  PLT 167 182    BMET  Recent Labs  07/01/14 0510 07/09/14 0534  NA 140 140  K 3.4* 3.5  CL 106 106  CO2 28 27  GLUCOSE 122* 134*  BUN 12 11  CREATININE 1.00 0.84  CALCIUM 8.4 8.6   PT/INR No results for input(s): LABPROT, INR in the last 72 hours.   Recent Labs Lab 06/29/14 0335 06/30/14 0544  AST 35 24  ALT 30 22  ALKPHOS 72 54  BILITOT 0.7 1.0  PROT 6.8 5.9*  ALBUMIN 4.1 3.3*     Lipase     Component Value Date/Time   LIPASE 33 06/29/2014 0335     Studies/Results: Dg Abd 2 Views  07-09-14   CLINICAL DATA:  Subsequent evaluation of small bowel obstruction  EXAM: ABDOMEN - 2 VIEW  COMPARISON:  07/01/14  FINDINGS: No change NG tube.Oral contrast into the rectum. There remain loops of mildly dilated small bowel, decreased in number and degree. A few scattered air-fluid levels remain stable  IMPRESSION: Low grade incomplete small bowel obstruction improved when compared to 07/01/14.   Electronically Signed   By: Skipper Cliche M.D.   On: 07/09/14 09:31   Dg Abd 2  Views  07/01/2014   CLINICAL DATA:  F/u SBO; patient states his abd is feeling better  EXAM: ABDOMEN - 2 VIEW  COMPARISON:  the previous day's study  FINDINGS: Nasogastric tube remains in the stomach.  No free air.  Several gas distended small bowel loops in the mid abdomen, decreased in degree of dilatation since previous exam. Normal distribution of gas and stool throughout the nondilated colon and rectum.  Bilateral pelvic phleboliths.  Spondylitic changes in the lumbar spine.  IMPRESSION: 1. Decrease in degree of small bowel dilatation since previous day's study.   Electronically Signed   By: Arne Cleveland M.D.   On: 07/01/2014 08:40    Medications: . heparin  5,000 Units Subcutaneous 3 times per day  . pantoprazole (PROTONIX) IV  40 mg Intravenous Q12H    Assessment/Plan SBO, with prior hx of hemicolectomy  Prior SBO with about 4 day hospitalization. 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  Start clears.   LOS: 3 days    Tamaj Jurgens C. 07/24/1550

## 2014-07-03 NOTE — Progress Notes (Signed)
  Subjective: He feels better and is still having flatus and BM's.   Objective: Vital signs in last 24 hours: Temp:  [97.3 F (36.3 C)-98.9 F (37.2 C)] 97.3 F (36.3 C) (01/21 0536) Pulse Rate:  [80-81] 81 (01/21 0536) Resp:  [18-19] 19 (01/21 0536) BP: (122-137)/(58-77) 136/76 mmHg (01/21 0536) SpO2:  [94 %-98 %] 94 % (01/21 0536) Last BM Date: 06/28/14  WBC is normal   Contrast in colon. Intake/Output from previous day: 2022/07/30 0701 - 01/21 0700 In: 19 [P.O.:120; I.V.:700] Out: 206 [Urine:204; Stool:2] Intake/Output this shift:    General appearance: alert, cooperative and no distress GI: softer, less distended   Lab Results:   Recent Labs  07/01/14 0510 30-Jul-2014 0534  WBC 5.9 5.8  HGB 13.5 14.0  HCT 39.7 41.0  PLT 167 182    BMET  Recent Labs  07/01/14 0510 July 30, 2014 0534  NA 140 140  K 3.4* 3.5  CL 106 106  CO2 28 27  GLUCOSE 122* 134*  BUN 12 11  CREATININE 1.00 0.84  CALCIUM 8.4 8.6   PT/INR No results for input(s): LABPROT, INR in the last 72 hours.   Recent Labs Lab 06/29/14 0335 06/30/14 0544  AST 35 24  ALT 30 22  ALKPHOS 72 54  BILITOT 0.7 1.0  PROT 6.8 5.9*  ALBUMIN 4.1 3.3*     Lipase     Component Value Date/Time   LIPASE 33 06/29/2014 0335     Studies/Results: Dg Abd 2 Views  07/30/2014   CLINICAL DATA:  Subsequent evaluation of small bowel obstruction  EXAM: ABDOMEN - 2 VIEW  COMPARISON:  07/01/14  FINDINGS: No change NG tube.Oral contrast into the rectum. There remain loops of mildly dilated small bowel, decreased in number and degree. A few scattered air-fluid levels remain stable  IMPRESSION: Low grade incomplete small bowel obstruction improved when compared to 07/01/14.   Electronically Signed   By: Skipper Cliche M.D.   On: Jul 30, 2014 09:31    Medications: . heparin  5,000 Units Subcutaneous 3 times per day  . pantoprazole (PROTONIX) IV  40 mg Intravenous Q12H    Assessment/Plan SBO, with prior hx of  hemicolectomy  Prior SBO with about 4 day hospitalization. 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  Started soft diet.  Ok to d/c after lunch if tolerating his diet.     LOS: 4 days    Linnea Todisco C. 2/70/7867

## 2014-07-03 NOTE — Care Management Note (Signed)
Medicare Important Message given?  YES (If response is "NO", the following Medicare IM given date fields will be blank) Date Medicare IM given:  07/03/2014 Medicare IM given by:  Marney Doctor Date Additional Medicare IM given:

## 2014-07-03 NOTE — Discharge Summary (Signed)
Physician Discharge Summary  George [name] DPO:[id number] DOB: 03-08-1941 DOA: 06/29/2014  PCP: [name], MD  Admit date: 06/29/2014 Discharge date: 07/03/2014  Time spent: 30 minutes  Recommendations for Outpatient Follow-up:   1.  Need to f/u cbc + bmet as OP  Discharge Diagnoses:  Principal Problem:   SBO (small bowel obstruction) Active Problems:   OA (osteoarthritis) of knee   Abdominal pain, epigastric   Nausea with vomiting   H/O hemicolectomy   Discharge Condition: Good  Diet recommendation: soft  Filed Weights   06/29/14 0712 06/30/14 1422  Weight: 78.019 kg (172 lb) 78.019 kg (172 lb)    History of present illness:  42 ? h/o basal Cell Ca nose, s/p hemicolectomy 2005, OA, S/p R TKR presented with nausea, vomiting, abdominal pain. He is thought to have partial small bowel obstruction. He was admitted 1/17 He was expectantly managed with the assitance of gen surgery and managed non-operatively with an NG-Tube It was felt that he didn't require any further work up as he had 2 large stool 1/20 and was recommended to continue a soft diet for ~ 1 week and then seek medical attn with his PCP  Consultations:  Gen surgery  Discharge Exam: Filed Vitals:   07/03/14 0536  BP: 136/76  Pulse: 81  Temp: 97.3 F (36.3 C)  Resp: 19    General: E[place], NCAt Cardiovascular:  s1 s 2no m/r/g Respiratory: clear  Discharge Instructions   Discharge Instructions    Diet - low sodium heart healthy    Complete by:  As directed      Discharge instructions    Complete by:  As directed   Follow up as an OP with regular PCP     Increase activity slowly    Complete by:  As directed           Current Discharge Medication List    CONTINUE these medications which have NOT CHANGED   Details  acetaminophen (TYLENOL) 325 MG tablet Take 2 tablets (650 mg total) by mouth every 6 (six) hours as needed for mild pain (or Fever >/= 101). Qty: 40 tablet, Refills: 0     aspirin 81 MG chewable tablet Chew 81 mg by mouth daily.    buPROPion (WELLBUTRIN XL) 150 MG 24 hr tablet Take 450 mg by mouth every morning.    celecoxib (CELEBREX) 200 MG capsule Take 200 mg by mouth daily.    citalopram (CELEXA) 20 MG tablet Take 20 mg by mouth at bedtime.    doxazosin (CARDURA) 8 MG tablet Take 8 mg by mouth at bedtime.    finasteride (PROSCAR) 5 MG tablet Take 5 mg by mouth every morning.      STOP taking these medications     Multiple Vitamin (MULTIVITAMIN WITH MINERALS) TABS tablet        No Known Allergies    The results of significant diagnostics from this hospitalization (including imaging, microbiology, ancillary and laboratory) are listed below for reference.    Significant Diagnostic Studies: Ct Abdomen Pelvis W Contrast  06/29/2014   CLINICAL DATA:  Acute onset of epigastric abdominal pain and vomiting. Diaphoresis. Initial encounter.  EXAM: CT ABDOMEN AND PELVIS WITH CONTRAST  TECHNIQUE: Multidetector CT imaging of the abdomen and pelvis was performed using the standard protocol following bolus administration of intravenous contrast.  CONTRAST:  58mL OMNIPAQUE IOHEXOL 300 MG/ML SOLN, 173mL OMNIPAQUE IOHEXOL 300 MG/ML SOLN  COMPARISON:  Abdominal radiographs performed 10/21/2007  FINDINGS: The visualized lung bases  are clear.  The liver and spleen are unremarkable in appearance. The gallbladder is within normal limits. The pancreas and adrenal glands are unremarkable.  Mild nonspecific perinephric stranding is noted bilaterally. Several left renal cysts are seen, measuring up to 2.8 cm in size. There is no evidence of hydronephrosis. No renal or ureteral stones are seen. No perinephric stranding is appreciated.  There is mild dilatation of visualized small bowel loops to the level of the distal ileum, where there is mild fecalization and gradual decompression of the small bowel. Small bowel loops measure up to 3.2 cm in diameter. No focal transition point  is seen. There is mild fatty infiltration of the wall of the distal ileum, concerning for sequelae of chronic inflammation. The patient's ileocolic anastomosis at the right mid abdomen is unremarkable in appearance.  No free fluid is identified. The stomach is within normal limits. No acute vascular abnormalities are seen. Mild scattered calcification is seen along the abdominal aorta and its branches. A circumaortic left renal vein is noted.  The remaining colon is mostly decompressed. Minimal diverticulosis is noted along the descending and proximal sigmoid colon, without evidence of diverticulitis.  The bladder is mildly distended and grossly unremarkable. The prostate is enlarged, measuring 5.4 cm in transverse dimension. No inguinal lymphadenopathy is seen.  No acute osseous abnormalities are identified. Facet disease is noted at the lower lumbar spine.  IMPRESSION: 1. Mild dilatation of small bowel loops to the level of the distal ileum, where there is mild fecalization and gradual decompression of small bowel. No focal transition point seen. However, this could reflect a partial small bowel obstruction due to an adhesion. Small bowel loops measure up to 3.2 cm in diameter. 2. Mild fatty infiltration of the wall of the distal ileum, concerning for sequelae of chronic inflammation. Ileocolic anastomosis is unremarkable in appearance. 3. Minimal diverticulosis along the descending and proximal sigmoid colon, without evidence of diverticulitis. 4. Left renal cysts seen. 5. Enlarged prostate noted.   Electronically Signed   By: Garald Balding M.D.   On: 06/29/2014 05:50   Dg Abd 2 Views  07/02/2014   CLINICAL DATA:  Subsequent evaluation of small bowel obstruction  EXAM: ABDOMEN - 2 VIEW  COMPARISON:  07/01/14  FINDINGS: No change NG tube.Oral contrast into the rectum. There remain loops of mildly dilated small bowel, decreased in number and degree. A few scattered air-fluid levels remain stable  IMPRESSION: Low  grade incomplete small bowel obstruction improved when compared to 07/01/14.   Electronically Signed   By: Skipper Cliche M.D.   On: 07/02/2014 09:31   Dg Abd 2 Views  07/01/2014   CLINICAL DATA:  F/u SBO; patient states his abd is feeling better  EXAM: ABDOMEN - 2 VIEW  COMPARISON:  the previous day's study  FINDINGS: Nasogastric tube remains in the stomach.  No free air.  Several gas distended small bowel loops in the mid abdomen, decreased in degree of dilatation since previous exam. Normal distribution of gas and stool throughout the nondilated colon and rectum.  Bilateral pelvic phleboliths.  Spondylitic changes in the lumbar spine.  IMPRESSION: 1. Decrease in degree of small bowel dilatation since previous day's study.   Electronically Signed   By: Arne Cleveland M.D.   On: 07/01/2014 08:40   Dg Abd 2 Views  06/30/2014   CLINICAL DATA:  Small-bowel obstruction  EXAM: ABDOMEN - 2 VIEW  COMPARISON:  06/29/2014  FINDINGS: A nasogastric catheter is now seen within the stomach.  Persistent and slightly increased small bowel dilatation is noted. Scattered gas is noted within the colon suggesting a partial obstruction. Postsurgical changes are noted in the right lower quadrant.  IMPRESSION: Increased small bowel dilatation when compared with the prior exam.   Electronically Signed   By: Inez Catalina M.D.   On: 06/30/2014 09:36   Dg Loyce Dys Tube Plc W/fl W/rad  06/29/2014   CLINICAL DATA:  Nasogastric tube needed for bowel decompression. Unsuccessful attempt on the floor.  EXAM: PLACEMENT OF NASOGASTRIC TUBE WITH FLUOROSCOPY  Physician: Stephan Minister. Anselm Pancoast, MD  FLUOROSCOPY TIME:  12 seconds  MEDICATIONS: None  ANESTHESIA/SEDATION: Moderate sedation time: None  PROCEDURE: A 18 French nasogastric tube was placed through the left nostril. The tube was easily advanced into the esophagus and stomach with fluoroscopy. Nasogastric tube was placed in the gastric body region. Tube was secured to the skin.  FINDINGS: Tip of  feeding tube in the left upper quadrant, presumed to be in the stomach body region.  COMPLICATIONS: None  IMPRESSION: Successful placement of a nasogastric tube with fluoroscopy.   Electronically Signed   By: Markus Daft M.D.   On: 06/29/2014 10:50    Microbiology: No results found for this or any previous visit (from the past 240 hour(s)).   Labs: Basic Metabolic Panel:  Recent Labs Lab 06/29/14 0335 06/29/14 0502 06/30/14 0544 07/01/14 0510 07/02/14 0534  NA 137 142 142 140 140  K 3.8 4.3 4.2 3.4* 3.5  CL 104 103 105 106 106  CO2 23  --  30 28 27   GLUCOSE 166* 167* 154* 122* 134*  BUN 20 31* 20 12 11   CREATININE 1.14 1.00 1.13 1.00 0.84  CALCIUM 9.2  --  8.5 8.4 8.6   Liver Function Tests:  Recent Labs Lab 06/29/14 0335 06/30/14 0544  AST 35 24  ALT 30 22  ALKPHOS 72 54  BILITOT 0.7 1.0  PROT 6.8 5.9*  ALBUMIN 4.1 3.3*    Recent Labs Lab 06/29/14 0335  LIPASE 33   No results for input(s): AMMONIA in the last 168 hours. CBC:  Recent Labs Lab 06/29/14 0335 06/29/14 0502 06/30/14 0544 07/01/14 0510 07/02/14 0534  WBC 10.9*  --  7.3 5.9 5.8  NEUTROABS 7.7  --   --   --   --   HGB 16.1 15.3 14.4 13.5 14.0  HCT 45.6 45.0 41.4 39.7 41.0  MCV 87.2  --  89.0 89.0 88.0  PLT 228  --  188 167 182   Cardiac Enzymes: No results for input(s): CKTOTAL, CKMB, CKMBINDEX, TROPONINI in the last 168 hours. BNP: BNP (last 3 results) No results for input(s): PROBNP in the last 8760 hours. CBG: No results for input(s): GLUCAP in the last 168 hours.     SignedNita Sells  [facility] 07/03/2014, 10:53 AM

## 2014-07-08 DIAGNOSIS — K5669 Other intestinal obstruction: Secondary | ICD-10-CM | POA: Diagnosis not present

## 2014-08-20 DIAGNOSIS — R972 Elevated prostate specific antigen [PSA]: Secondary | ICD-10-CM | POA: Diagnosis not present

## 2014-08-27 DIAGNOSIS — N5201 Erectile dysfunction due to arterial insufficiency: Secondary | ICD-10-CM | POA: Diagnosis not present

## 2014-08-27 DIAGNOSIS — N402 Nodular prostate without lower urinary tract symptoms: Secondary | ICD-10-CM | POA: Diagnosis not present

## 2014-08-27 DIAGNOSIS — E291 Testicular hypofunction: Secondary | ICD-10-CM | POA: Diagnosis not present

## 2014-08-27 DIAGNOSIS — R972 Elevated prostate specific antigen [PSA]: Secondary | ICD-10-CM | POA: Diagnosis not present

## 2014-09-08 DIAGNOSIS — M542 Cervicalgia: Secondary | ICD-10-CM | POA: Diagnosis not present

## 2014-09-08 DIAGNOSIS — M9901 Segmental and somatic dysfunction of cervical region: Secondary | ICD-10-CM | POA: Diagnosis not present

## 2014-09-08 DIAGNOSIS — M546 Pain in thoracic spine: Secondary | ICD-10-CM | POA: Diagnosis not present

## 2014-09-08 DIAGNOSIS — M9903 Segmental and somatic dysfunction of lumbar region: Secondary | ICD-10-CM | POA: Diagnosis not present

## 2014-09-10 DIAGNOSIS — M9901 Segmental and somatic dysfunction of cervical region: Secondary | ICD-10-CM | POA: Diagnosis not present

## 2014-09-10 DIAGNOSIS — M9903 Segmental and somatic dysfunction of lumbar region: Secondary | ICD-10-CM | POA: Diagnosis not present

## 2014-09-10 DIAGNOSIS — M542 Cervicalgia: Secondary | ICD-10-CM | POA: Diagnosis not present

## 2014-09-10 DIAGNOSIS — M546 Pain in thoracic spine: Secondary | ICD-10-CM | POA: Diagnosis not present

## 2014-09-15 DIAGNOSIS — H6590 Unspecified nonsuppurative otitis media, unspecified ear: Secondary | ICD-10-CM | POA: Diagnosis not present

## 2014-10-03 ENCOUNTER — Encounter (HOSPITAL_COMMUNITY): Payer: Self-pay | Admitting: Emergency Medicine

## 2014-10-03 ENCOUNTER — Emergency Department (HOSPITAL_COMMUNITY): Payer: Medicare Other

## 2014-10-03 ENCOUNTER — Emergency Department (HOSPITAL_COMMUNITY)
Admission: EM | Admit: 2014-10-03 | Discharge: 2014-10-04 | Disposition: A | Discharge status: Home / Self Care | Payer: Medicare Other | Attending: Emergency Medicine | Admitting: Emergency Medicine

## 2014-10-03 DIAGNOSIS — Z8719 Personal history of other diseases of the digestive system: Secondary | ICD-10-CM | POA: Diagnosis not present

## 2014-10-03 DIAGNOSIS — R1030 Lower abdominal pain, unspecified: Secondary | ICD-10-CM | POA: Diagnosis not present

## 2014-10-03 DIAGNOSIS — R109 Unspecified abdominal pain: Secondary | ICD-10-CM

## 2014-10-03 DIAGNOSIS — Z791 Long term (current) use of non-steroidal anti-inflammatories (NSAID): Secondary | ICD-10-CM | POA: Diagnosis not present

## 2014-10-03 DIAGNOSIS — Z7982 Long term (current) use of aspirin: Secondary | ICD-10-CM | POA: Insufficient documentation

## 2014-10-03 DIAGNOSIS — M1990 Unspecified osteoarthritis, unspecified site: Secondary | ICD-10-CM | POA: Diagnosis not present

## 2014-10-03 DIAGNOSIS — Z87891 Personal history of nicotine dependence: Secondary | ICD-10-CM | POA: Diagnosis not present

## 2014-10-03 DIAGNOSIS — R1031 Right lower quadrant pain: Secondary | ICD-10-CM | POA: Diagnosis not present

## 2014-10-03 DIAGNOSIS — Z79899 Other long term (current) drug therapy: Secondary | ICD-10-CM | POA: Insufficient documentation

## 2014-10-03 DIAGNOSIS — R1032 Left lower quadrant pain: Secondary | ICD-10-CM | POA: Diagnosis not present

## 2014-10-03 DIAGNOSIS — Z85828 Personal history of other malignant neoplasm of skin: Secondary | ICD-10-CM | POA: Diagnosis not present

## 2014-10-03 LAB — COMPREHENSIVE METABOLIC PANEL
ALBUMIN: 3.8 g/dL (ref 3.5–5.2)
ALT: 22 U/L (ref 0–53)
AST: 25 U/L (ref 0–37)
Alkaline Phosphatase: 64 U/L (ref 39–117)
Anion gap: 4 — ABNORMAL LOW (ref 5–15)
BUN: 21 mg/dL (ref 6–23)
CALCIUM: 9.3 mg/dL (ref 8.4–10.5)
CO2: 30 mmol/L (ref 19–32)
Chloride: 105 mmol/L (ref 96–112)
Creatinine, Ser: 1.03 mg/dL (ref 0.50–1.35)
GFR calc Af Amer: 82 mL/min — ABNORMAL LOW (ref >90)
GFR calc non Af Amer: 71 mL/min — ABNORMAL LOW (ref >90)
Glucose, Bld: 117 mg/dL — ABNORMAL HIGH (ref 70–99)
Potassium: 4 mmol/L (ref 3.5–5.1)
Sodium: 139 mmol/L (ref 135–145)
Total Bilirubin: 0.5 mg/dL (ref 0.3–1.2)
Total Protein: 6.4 g/dL (ref 6.0–8.3)

## 2014-10-03 LAB — CBC WITH DIFFERENTIAL/PLATELET
Basophils Absolute: 0 10*3/uL (ref 0.0–0.1)
Basophils Relative: 0 % (ref 0–1)
Eosinophils Absolute: 0.2 10*3/uL (ref 0.0–0.7)
Eosinophils Relative: 2 % (ref 0–5)
HEMATOCRIT: 43.6 % (ref 39.0–52.0)
Hemoglobin: 15.3 g/dL (ref 13.0–17.0)
LYMPHS ABS: 0.9 10*3/uL (ref 0.7–4.0)
LYMPHS PCT: 7 % — AB (ref 12–46)
MCH: 31.7 pg (ref 26.0–34.0)
MCHC: 35.1 g/dL (ref 30.0–36.0)
MCV: 90.5 fL (ref 78.0–100.0)
MONO ABS: 1 10*3/uL (ref 0.1–1.0)
Monocytes Relative: 7 % (ref 3–12)
Neutro Abs: 12.1 10*3/uL — ABNORMAL HIGH (ref 1.7–7.7)
Neutrophils Relative %: 84 % — ABNORMAL HIGH (ref 43–77)
Platelets: 177 10*3/uL (ref 150–400)
RBC: 4.82 MIL/uL (ref 4.22–5.81)
RDW: 12.5 % (ref 11.5–15.5)
WBC: 14.3 10*3/uL — AB (ref 4.0–10.5)

## 2014-10-03 LAB — LIPASE, BLOOD: Lipase: 25 U/L (ref 11–59)

## 2014-10-03 MED ORDER — IOHEXOL 300 MG/ML  SOLN
100.0000 mL | Freq: Once | INTRAMUSCULAR | Status: AC | PRN
  Administered 2014-10-03: 100 mL via INTRAVENOUS

## 2014-10-03 MED ORDER — SODIUM CHLORIDE 0.9 % IV BOLUS (SEPSIS)
1000.0000 mL | Freq: Once | INTRAVENOUS | Status: AC
  Administered 2014-10-03: 1000 mL via INTRAVENOUS

## 2014-10-03 MED ORDER — HYDROMORPHONE HCL 1 MG/ML IJ SOLN: 1.0000 mg | Freq: Once | INTRAMUSCULAR | Status: DC

## 2014-10-03 MED ORDER — HYDROMORPHONE HCL 1 MG/ML IJ SOLN
1.0000 mg | Freq: Once | INTRAMUSCULAR | Status: AC
  Administered 2014-10-03: 1 mg via INTRAVENOUS
  Filled 2014-10-03: qty 1

## 2014-10-03 NOTE — ED Provider Notes (Signed)
CSN: [id number]     Arrival date & time 10/03/14  1942 History   First MD Initiated Contact with Patient 10/03/14 2018     Chief Complaint  Patient presents with  . Abdominal Pain     (Consider location/radiation/quality/duration/timing/severity/associated sxs/prior Treatment) Patient is a 74 y.o. male presenting with abdominal pain. The history is provided by the patient.  Abdominal Pain Pain location: lower abdomen. Pain quality: not aching, not heavy and no pressure   Pain radiates to:  Does not radiate Pain severity:  Moderate Onset quality:  Gradual Timing:  Constant Progression:  Unchanged Chronicity:  Recurrent (similar to prior SBOs) Context: previous surgery (R hemicolectomy)   Context: not eating and not trauma   Relieved by:  Nothing Worsened by:  Nothing tried Associated symptoms: no constipation, no cough, no diarrhea, no fever, no shortness of breath and no vomiting     Past Medical History  Diagnosis Date  . Arthritis   . 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 Small bowel obstruction    Past Surgical History  Procedure Laterality Date  . Hemicolectomy    . Skin cancer removed from nose     . Joint replacement      left knee   . 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 No pertinent family history. History  Substance Use Topics  . Smoking status: Former Research scientist (life sciences)  . Smokeless tobacco: Never Used  . Alcohol Use: Yes     Comment: 6 beers per week    Review of Systems  Constitutional: Negative for fever.  Respiratory: Negative for cough and shortness of breath.   Gastrointestinal: Positive for abdominal pain. Negative for vomiting, diarrhea and constipation.  All other systems reviewed and are negative.     Allergies  Review of  patient's allergies indicates no known allergies.  Home Medications   Prior to Admission medications   Medication Sig Start Date End Date Taking? Authorizing Provider  acetaminophen (TYLENOL) 325 MG tablet Take 2 tablets (650 mg total) by mouth every 6 (six) hours as needed for mild pain (or Fever >/= 101). Patient not taking: Reported on 10/03/2014 03/05/14   Arlee Muslim, PA-C  amoxicillin (AMOXIL) 500 MG capsule Take 4 capsules by mouth as needed. Dental appointments. 08/13/14  Yes Historical Provider, MD  ANDROGEL PUMP 20.25 MG/ACT (1.62%) GEL Apply 1 application topically 3 (three) times a week. 09/08/14  Yes Historical Provider, MD  aspirin 81 MG chewable tablet Chew 81 mg by mouth daily.   Yes Historical Provider, MD  buPROPion (WELLBUTRIN XL) 150 MG 24 hr tablet Take 450 mg by mouth every morning.   Yes Historical Provider, MD  celecoxib (CELEBREX) 200 MG capsule Take 200 mg by mouth daily.   Yes Historical Provider, MD  citalopram (CELEXA) 20 MG tablet Take 20 mg by mouth at bedtime.   Yes Historical Provider, MD  doxazosin (CARDURA) 8 MG tablet Take 8 mg by mouth daily.    Yes Historical Provider, MD  finasteride (PROSCAR) 5 MG tablet Take 5 mg by mouth every morning.   Yes Historical Provider, MD  naproxen sodium (ANAPROX) 220 MG tablet Take 220 mg by mouth 2 (two) times daily with a meal.   Yes Historical Provider, MD   BP 135/75  mmHg  Pulse 83  Temp(Src) 98.3 F (36.8 C) (Oral)  Resp 18  SpO2 97% Physical Exam  Constitutional: He is oriented to person, place, and time. He appears well-developed and well-nourished. No distress.  HENT:  Head: Normocephalic and atraumatic.  Mouth/Throat: Oropharynx is clear and moist. No oropharyngeal exudate.  Eyes: EOM are normal. Pupils are equal, round, and reactive to light.  Neck: Normal range of motion. Neck supple.  Cardiovascular: Normal rate and regular rhythm.  Exam reveals no friction rub.   No murmur heard. Pulmonary/Chest: Effort  normal and breath sounds normal. No respiratory distress. He has no wheezes. He has no rales.  Abdominal: He exhibits no distension. There is tenderness. There is guarding (diffuse lower). There is no rebound.  Musculoskeletal: Normal range of motion. He exhibits no edema.  Neurological: He is alert and oriented to person, place, and time. No cranial nerve deficit. He exhibits normal muscle tone. Coordination normal.  Skin: No rash noted. He is not diaphoretic.  Nursing note and vitals reviewed.   ED Course  Procedures (including critical care time) Labs Review Labs Reviewed  CBC WITH DIFFERENTIAL/PLATELET - Abnormal; Notable for the following:    WBC 14.3 (*)    Neutrophils Relative % 84 (*)    Neutro Abs 12.1 (*)    Lymphocytes Relative 7 (*)    All other components within normal limits  COMPREHENSIVE METABOLIC PANEL - Abnormal; Notable for the following:    Glucose, Bld 117 (*)    GFR calc non Af Amer 71 (*)    GFR calc Af Amer 82 (*)    Anion gap 4 (*)    All other components within normal limits  LIPASE, BLOOD    Imaging Review Ct Abdomen Pelvis W Contrast  10/04/2014   CLINICAL DATA:  Abdominal pain.  EXAM: CT ABDOMEN AND PELVIS WITH CONTRAST  TECHNIQUE: Multidetector CT imaging of the abdomen and pelvis was performed using the standard protocol following bolus administration of intravenous contrast.  CONTRAST:  165mL OMNIPAQUE IOHEXOL 300 MG/ML  SOLN  COMPARISON:  CT scan dated 06/29/2014  FINDINGS: The liver, biliary tree, spleen, pancreas, adrenal glands, and right kidney are normal. There are 3 simple cysts in the left kidney measuring 13, 24, and 29 mm, unchanged.  Ascending colon has been resected. There is slight chronic mucosal thickening of distal small bowel. No dilated loops of bowel. There are a few diverticula in the left side of the colon. Bladder is normal. Prostate gland is enlarged but stable.  No adenopathy. No free air or free fluid. Slight atherosclerotic  disease of the abdominal aorta. 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  IMPRESSION: No acute abnormality. Chronic mucosal thickening of the distal small bowel.   Electronically Signed   By: Lorriane Shire M.D.   On: 10/04/2014 00:20     EKG Interpretation None      MDM   Final diagnoses:  Abdominal pain    45M with abdominal pain today. Lower, nonradiating. Feels similar to prior to SBO.  [facility] here. Will scan his belly. CT normal. DOes still have some lower abdominal pain, but it is improved. Doubt early appendicitis since he had a R hemicolectomy for a colon polyp previously. Stable for discharge, given return precautions.  I have reviewed all labs and imaging and considered them in my medical decision making.   Evelina Bucy, MD 10/04/14 0040

## 2014-10-03 NOTE — ED Notes (Addendum)
Pt reports abdominal pain (bilateral lower and mid) starting around 1630. Has gallbladder but has had appendectomy. Last time this pain was associated with a bile blockage according to patient. Had NG last time-had to be placed in x-ray (unable to place in ED). Denies N/V/D/F. Denies c/c.

## 2014-10-03 NOTE — ED Notes (Signed)
Bed: WA09 Expected date:  Expected time:  Means of arrival:  Comments: Triage 1 

## 2014-10-04 NOTE — Discharge Instructions (Signed)

## 2014-10-04 NOTE — ED Notes (Signed)
MD at bedside. 

## 2014-10-15 DIAGNOSIS — M545 Low back pain: Secondary | ICD-10-CM | POA: Diagnosis not present

## 2014-10-15 DIAGNOSIS — M9903 Segmental and somatic dysfunction of lumbar region: Secondary | ICD-10-CM | POA: Diagnosis not present

## 2014-10-22 DIAGNOSIS — M545 Low back pain: Secondary | ICD-10-CM | POA: Diagnosis not present

## 2014-10-22 DIAGNOSIS — M9903 Segmental and somatic dysfunction of lumbar region: Secondary | ICD-10-CM | POA: Diagnosis not present

## 2014-11-07 DIAGNOSIS — M5032 Other cervical disc degeneration, mid-cervical region: Secondary | ICD-10-CM | POA: Diagnosis not present

## 2014-11-07 DIAGNOSIS — M542 Cervicalgia: Secondary | ICD-10-CM | POA: Diagnosis not present

## 2014-12-04 DIAGNOSIS — H6590 Unspecified nonsuppurative otitis media, unspecified ear: Secondary | ICD-10-CM | POA: Diagnosis not present

## 2014-12-08 ENCOUNTER — Other Ambulatory Visit: Payer: Self-pay

## 2014-12-23 DIAGNOSIS — H61813 Exostosis of external canal, bilateral: Secondary | ICD-10-CM | POA: Diagnosis not present

## 2014-12-23 DIAGNOSIS — M2662 Arthralgia of temporomandibular joint: Secondary | ICD-10-CM | POA: Diagnosis not present

## 2014-12-23 DIAGNOSIS — K1321 Leukoplakia of oral mucosa, including tongue: Secondary | ICD-10-CM | POA: Diagnosis not present

## 2014-12-23 DIAGNOSIS — Z87891 Personal history of nicotine dependence: Secondary | ICD-10-CM | POA: Diagnosis not present

## 2014-12-26 DIAGNOSIS — L821 Other seborrheic keratosis: Secondary | ICD-10-CM | POA: Diagnosis not present

## 2014-12-26 DIAGNOSIS — D225 Melanocytic nevi of trunk: Secondary | ICD-10-CM | POA: Diagnosis not present

## 2014-12-26 DIAGNOSIS — D1801 Hemangioma of skin and subcutaneous tissue: Secondary | ICD-10-CM | POA: Diagnosis not present

## 2015-01-06 DIAGNOSIS — H6981 Other specified disorders of Eustachian tube, right ear: Secondary | ICD-10-CM | POA: Diagnosis not present

## 2015-01-06 DIAGNOSIS — F3341 Major depressive disorder, recurrent, in partial remission: Secondary | ICD-10-CM | POA: Diagnosis not present

## 2015-01-06 DIAGNOSIS — F5101 Primary insomnia: Secondary | ICD-10-CM | POA: Diagnosis not present

## 2015-01-06 DIAGNOSIS — M2660 Temporomandibular joint disorder, unspecified: Secondary | ICD-10-CM | POA: Diagnosis not present

## 2015-01-06 DIAGNOSIS — M542 Cervicalgia: Secondary | ICD-10-CM | POA: Diagnosis not present

## 2015-03-13 DIAGNOSIS — R972 Elevated prostate specific antigen [PSA]: Secondary | ICD-10-CM | POA: Diagnosis not present

## 2015-03-13 DIAGNOSIS — E291 Testicular hypofunction: Secondary | ICD-10-CM | POA: Diagnosis not present

## 2015-03-18 DIAGNOSIS — N402 Nodular prostate without lower urinary tract symptoms: Secondary | ICD-10-CM | POA: Diagnosis not present

## 2015-03-18 DIAGNOSIS — R972 Elevated prostate specific antigen [PSA]: Secondary | ICD-10-CM | POA: Diagnosis not present

## 2015-03-18 DIAGNOSIS — E291 Testicular hypofunction: Secondary | ICD-10-CM | POA: Diagnosis not present

## 2015-03-18 DIAGNOSIS — N5201 Erectile dysfunction due to arterial insufficiency: Secondary | ICD-10-CM | POA: Diagnosis not present

## 2015-05-19 DIAGNOSIS — M545 Low back pain: Secondary | ICD-10-CM | POA: Diagnosis not present

## 2015-05-19 DIAGNOSIS — M9903 Segmental and somatic dysfunction of lumbar region: Secondary | ICD-10-CM | POA: Diagnosis not present

## 2015-05-29 DIAGNOSIS — Z23 Encounter for immunization: Secondary | ICD-10-CM | POA: Diagnosis not present

## 2015-07-09 DIAGNOSIS — F329 Major depressive disorder, single episode, unspecified: Secondary | ICD-10-CM | POA: Diagnosis not present

## 2015-07-09 DIAGNOSIS — Z23 Encounter for immunization: Secondary | ICD-10-CM | POA: Diagnosis not present

## 2015-07-09 DIAGNOSIS — R251 Tremor, unspecified: Secondary | ICD-10-CM | POA: Diagnosis not present

## 2015-07-09 DIAGNOSIS — M1990 Unspecified osteoarthritis, unspecified site: Secondary | ICD-10-CM | POA: Diagnosis not present

## 2015-07-27 DIAGNOSIS — L821 Other seborrheic keratosis: Secondary | ICD-10-CM | POA: Diagnosis not present

## 2015-07-27 DIAGNOSIS — L988 Other specified disorders of the skin and subcutaneous tissue: Secondary | ICD-10-CM | POA: Diagnosis not present

## 2015-07-27 DIAGNOSIS — L820 Inflamed seborrheic keratosis: Secondary | ICD-10-CM | POA: Diagnosis not present

## 2015-08-06 DIAGNOSIS — L03116 Cellulitis of left lower limb: Secondary | ICD-10-CM | POA: Diagnosis not present

## 2015-09-07 DIAGNOSIS — J209 Acute bronchitis, unspecified: Secondary | ICD-10-CM | POA: Diagnosis not present

## 2015-10-04 ENCOUNTER — Observation Stay (HOSPITAL_BASED_OUTPATIENT_CLINIC_OR_DEPARTMENT_OTHER)
Admission: EM | Admit: 2015-10-04 | Discharge: 2015-10-05 | Disposition: A | Discharge status: Home / Self Care | Payer: Medicare Other | Attending: Internal Medicine | Admitting: Internal Medicine

## 2015-10-04 ENCOUNTER — Encounter (HOSPITAL_BASED_OUTPATIENT_CLINIC_OR_DEPARTMENT_OTHER): Payer: Self-pay | Admitting: *Deleted

## 2015-10-04 ENCOUNTER — Emergency Department (HOSPITAL_BASED_OUTPATIENT_CLINIC_OR_DEPARTMENT_OTHER): Payer: Medicare Other

## 2015-10-04 DIAGNOSIS — K529 Noninfective gastroenteritis and colitis, unspecified: Secondary | ICD-10-CM | POA: Diagnosis not present

## 2015-10-04 DIAGNOSIS — E860 Dehydration: Secondary | ICD-10-CM | POA: Insufficient documentation

## 2015-10-04 DIAGNOSIS — E869 Volume depletion, unspecified: Secondary | ICD-10-CM | POA: Diagnosis present

## 2015-10-04 DIAGNOSIS — Z87891 Personal history of nicotine dependence: Secondary | ICD-10-CM | POA: Diagnosis not present

## 2015-10-04 DIAGNOSIS — F329 Major depressive disorder, single episode, unspecified: Secondary | ICD-10-CM | POA: Diagnosis not present

## 2015-10-04 DIAGNOSIS — R519 Headache, unspecified: Secondary | ICD-10-CM | POA: Diagnosis present

## 2015-10-04 DIAGNOSIS — Z79899 Other long term (current) drug therapy: Secondary | ICD-10-CM | POA: Diagnosis not present

## 2015-10-04 DIAGNOSIS — Z7982 Long term (current) use of aspirin: Secondary | ICD-10-CM | POA: Insufficient documentation

## 2015-10-04 DIAGNOSIS — M542 Cervicalgia: Secondary | ICD-10-CM | POA: Insufficient documentation

## 2015-10-04 DIAGNOSIS — M1990 Unspecified osteoarthritis, unspecified site: Secondary | ICD-10-CM | POA: Diagnosis not present

## 2015-10-04 DIAGNOSIS — S161XXA Strain of muscle, fascia and tendon at neck level, initial encounter: Secondary | ICD-10-CM | POA: Diagnosis not present

## 2015-10-04 DIAGNOSIS — R739 Hyperglycemia, unspecified: Secondary | ICD-10-CM | POA: Diagnosis present

## 2015-10-04 DIAGNOSIS — R51 Headache: Secondary | ICD-10-CM | POA: Insufficient documentation

## 2015-10-04 DIAGNOSIS — R55 Syncope and collapse: Secondary | ICD-10-CM | POA: Diagnosis not present

## 2015-10-04 DIAGNOSIS — G936 Cerebral edema: Secondary | ICD-10-CM | POA: Diagnosis not present

## 2015-10-04 DIAGNOSIS — Z96651 Presence of right artificial knee joint: Secondary | ICD-10-CM | POA: Diagnosis not present

## 2015-10-04 DIAGNOSIS — M479 Spondylosis, unspecified: Secondary | ICD-10-CM | POA: Insufficient documentation

## 2015-10-04 DIAGNOSIS — Z9049 Acquired absence of other specified parts of digestive tract: Secondary | ICD-10-CM | POA: Insufficient documentation

## 2015-10-04 DIAGNOSIS — F32A Depression, unspecified: Secondary | ICD-10-CM | POA: Diagnosis present

## 2015-10-04 DIAGNOSIS — I951 Orthostatic hypotension: Secondary | ICD-10-CM | POA: Diagnosis present

## 2015-10-04 DIAGNOSIS — Z791 Long term (current) use of non-steroidal anti-inflammatories (NSAID): Secondary | ICD-10-CM | POA: Insufficient documentation

## 2015-10-04 LAB — CBC WITH DIFFERENTIAL/PLATELET
BASOS ABS: 0 10*3/uL (ref 0.0–0.1)
BASOS PCT: 0 %
EOS PCT: 1 %
Eosinophils Absolute: 0.1 10*3/uL (ref 0.0–0.7)
HCT: 44.7 % (ref 39.0–52.0)
HEMOGLOBIN: 15.9 g/dL (ref 13.0–17.0)
Lymphocytes Relative: 15 %
Lymphs Abs: 1.1 10*3/uL (ref 0.7–4.0)
MCH: 32 pg (ref 26.0–34.0)
MCHC: 35.6 g/dL (ref 30.0–36.0)
MCV: 89.9 fL (ref 78.0–100.0)
Monocytes Absolute: 0.7 10*3/uL (ref 0.1–1.0)
Monocytes Relative: 9 %
NEUTROS PCT: 75 %
Neutro Abs: 5.4 10*3/uL (ref 1.7–7.7)
PLATELETS: 215 10*3/uL (ref 150–400)
RBC: 4.97 MIL/uL (ref 4.22–5.81)
RDW: 12.9 % (ref 11.5–15.5)
WBC: 7.2 10*3/uL (ref 4.0–10.5)

## 2015-10-04 LAB — URINALYSIS, ROUTINE W REFLEX MICROSCOPIC
BILIRUBIN URINE: NEGATIVE
Glucose, UA: NEGATIVE mg/dL
Hgb urine dipstick: NEGATIVE
KETONES UR: NEGATIVE mg/dL
Leukocytes, UA: NEGATIVE
NITRITE: NEGATIVE
PH: 6 (ref 5.0–8.0)
PROTEIN: NEGATIVE mg/dL
Specific Gravity, Urine: 1.021 (ref 1.005–1.030)

## 2015-10-04 LAB — COMPREHENSIVE METABOLIC PANEL
ALK PHOS: 72 U/L (ref 38–126)
ALT: 18 U/L (ref 17–63)
ANION GAP: 13 (ref 5–15)
AST: 27 U/L (ref 15–41)
Albumin: 3.9 g/dL (ref 3.5–5.0)
BUN: 13 mg/dL (ref 6–20)
CALCIUM: 9.6 mg/dL (ref 8.9–10.3)
CO2: 24 mmol/L (ref 22–32)
CREATININE: 1.15 mg/dL (ref 0.61–1.24)
Chloride: 103 mmol/L (ref 101–111)
Glucose, Bld: 167 mg/dL — ABNORMAL HIGH (ref 65–99)
Potassium: 3.8 mmol/L (ref 3.5–5.1)
Sodium: 140 mmol/L (ref 135–145)
TOTAL PROTEIN: 6.9 g/dL (ref 6.5–8.1)
Total Bilirubin: 0.8 mg/dL (ref 0.3–1.2)

## 2015-10-04 LAB — PHOSPHORUS: Phosphorus: 3.9 mg/dL (ref 2.5–4.6)

## 2015-10-04 LAB — TROPONIN I

## 2015-10-04 LAB — MAGNESIUM: MAGNESIUM: 1.8 mg/dL (ref 1.7–2.4)

## 2015-10-04 MED ORDER — ACETAMINOPHEN 325 MG PO TABS
650.0000 mg | ORAL_TABLET | Freq: Once | ORAL | Status: AC
  Administered 2015-10-04: 650 mg via ORAL
  Filled 2015-10-04: qty 2

## 2015-10-04 MED ORDER — TRAMADOL HCL 50 MG PO TABS: 50.0000 mg | ORAL_TABLET | Freq: Two times a day (BID) | ORAL | Status: DC | PRN

## 2015-10-04 MED ORDER — KETOROLAC TROMETHAMINE 30 MG/ML IJ SOLN
30.0000 mg | Freq: Once | INTRAMUSCULAR | Status: AC
  Administered 2015-10-04: 30 mg via INTRAVENOUS
  Filled 2015-10-04: qty 1

## 2015-10-04 MED ORDER — TRAMADOL HCL 50 MG PO TABS: 25.0000 mg | ORAL_TABLET | Freq: Four times a day (QID) | ORAL | Status: DC | PRN

## 2015-10-04 MED ORDER — PANTOPRAZOLE SODIUM 40 MG PO TBEC
40.0000 mg | DELAYED_RELEASE_TABLET | Freq: Every day | ORAL | Status: DC
  Administered 2015-10-04 – 2015-10-05 (×2): 40 mg via ORAL
  Filled 2015-10-04 (×2): qty 1

## 2015-10-04 MED ORDER — POTASSIUM CHLORIDE IN NACL 20-0.9 MEQ/L-% IV SOLN
INTRAVENOUS | Status: AC
  Administered 2015-10-04: 21:00:00 via INTRAVENOUS
  Filled 2015-10-04 (×2): qty 1000

## 2015-10-04 MED ORDER — ENOXAPARIN SODIUM 40 MG/0.4ML ~~LOC~~ SOLN
40.0000 mg | SUBCUTANEOUS | Status: DC
  Administered 2015-10-04: 40 mg via SUBCUTANEOUS
  Filled 2015-10-04: qty 0.4

## 2015-10-04 MED ORDER — CYCLOBENZAPRINE HCL 5 MG PO TABS
5.0000 mg | ORAL_TABLET | Freq: Every day | ORAL | Status: DC
  Administered 2015-10-04: 5 mg via ORAL
  Filled 2015-10-04: qty 1

## 2015-10-04 NOTE — ED Notes (Signed)
Pt reports Thursday night he had a syncopal episode while sititng down.  Reports Friday morning he did the same thing.  Pt reports that he was home alone when it happened.  States that he felt lightheaded prior to episode.  Denies pain.  States that he fell out of his chair and woke up on the floor.  Pt is reporting neck pain and a terrible HA right after he fell.  Reports that the HA is still present.  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  C-collar applied in triage.  Pt A/O x 4, ambulatory.

## 2015-10-04 NOTE — ED Notes (Signed)
Pt reports posterior HA with no vision change.

## 2015-10-04 NOTE — ED Notes (Addendum)
Accompanied pt to CT 

## 2015-10-04 NOTE — Plan of Care (Signed)
From [facility], Dr. Alvino Chapel EDP  75 year old male with a history of arthritis, basal cell carcinoma, that presented to the emergency department with complaints of passing out. Seems the patient had an episode on Wednesday, he also fell and hit his head. He did complain of headache as well as neck pain with decreased range of motion. Patient had a similar episode on Thursday and passed out again. Unknown duration of time. CT head negative for acute intracranial abnormalities. Frontal scalp edema without underlying calvarial fracture. No evidence of acute cervical spine abnormality. Patient will be transferred to [facility] for workup of syncope.  Accepted for observation, tele bed.  Time spent: 15 minutes  Melba Araki D.O. [facility] Pager [phone]  If 7PM-7AM, please contact night-coverage [email or web address] Password [email or web address] 10/04/2015, 3:11 PM

## 2015-10-04 NOTE — H&P (Signed)
History and Physical    George [name] [id number] DOB: 09-Jul-1940 DOA: 10/04/2015  Referring MD/NP/PA: Davonna Belling, MD PCP: [name], MD  Outpatient Specialists:  Patient coming from: Home through [facility].  Chief Complaint: Headache.  HPI: George [name] is a 75 y.o. male with medical history significant for BPH, depression, osteoarthritis, history of hemicolectomy, C-spine osteoarthritis Who went to the emergency department at [facility] today due to headache.  Per patient, on Thursday evening and Friday evening he passed out once on both face while he was eating dinner. He has been by himself, but states that both times that he passed out it was for a brief period of time, since his food was still warm after he woke up. He denies chest pain, palpitations, diaphoresis, PND, orthopnea or pitting edema of the lower extremities. He complains of mild dizziness, transient mild nausea and has been having at least 2 episodes of loose stools for 2-3 weeks, since he was given oral antibiotics to treat her bronchitis. He feels "a little dehydrated".   When asked, why did he go to the emergency department today, instead of after the first or second syncopal episode, the patient states that he fell normal after this, except for headache since he believed he hit his head when falling. He states that his neck pain has exacerbated significantly and also has a headache from recent trauma.   ED Course: CT scan and basic labs were normal, with the exception of hyperglycemia 01 67 mg of deciliter. However, the patient states that he ate a muffin sandwich less than 2 hours before the blood samples were taken. He is being admitted for syncope workup.  Review of Systems: As per HPI otherwise 10 point review of systems negative.   Past Medical History  Diagnosis Date  . Arthritis   . 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 Small  bowel obstruction [facility])     Past Surgical History  Procedure Laterality Date  . Hemicolectomy    . Skin cancer removed from nose     . Joint replacement      left knee   . 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 He has never used smokeless tobacco. He reports that he drinks alcohol. He reports that he does not use illicit drugs.  No Known Allergies  Family History  Problem Relation Age of Onset  . Diabetes Mellitus II Mother   . Cancer Father   Family history reviewed with the patient.  Prior to Admission medications   Medication Sig Start Date End Date Taking? Authorizing Provider  ANDROGEL PUMP 20.25 MG/ACT (1.62%) GEL Apply 1 application topically 3 (three) times a week. 09/08/14  Yes Historical Provider, MD  aspirin 81 MG chewable tablet Chew 81 mg by mouth daily.   Yes Historical Provider, MD  buPROPion (WELLBUTRIN XL) 150 MG 24 hr tablet Take 450 mg by mouth every morning.   Yes Historical Provider, MD  celecoxib (CELEBREX) 200 MG capsule Take 200 mg by mouth daily.   Yes Historical Provider, MD  citalopram (CELEXA) 20 MG tablet Take 20 mg by mouth at bedtime.   Yes Historical Provider, MD  doxazosin (CARDURA) 8 MG tablet Take 8 mg by  mouth daily.    Yes Historical Provider, MD  finasteride (PROSCAR) 5 MG tablet Take 5 mg by mouth every morning.   Yes Historical Provider, MD  propantheline (PROBANTHINE) 15 MG tablet Take 20 mg by mouth 3 (three) times daily with meals.   Yes Historical Provider, MD    Physical Exam: Filed Vitals:   10/04/15 1700 10/04/15 1719 10/04/15 1812 10/04/15 1814  BP: 134/86 141/81  134/75  Pulse: 79 82  78  Temp:    98.8 F (37.1 C)  TempSrc:    Oral  Resp: 20 14    Height:   5\' 8"  (1.727 m)   Weight:   77.066 kg (169 lb 14.4 oz)   SpO2: 96% 98%  95%       Constitutional: NAD, calm, comfortable Filed Vitals:   10/04/15 1700 10/04/15 1719 10/04/15 1812 10/04/15 1814  BP: 134/86 141/81  134/75  Pulse: 79 82  78  Temp:    98.8 F (37.1 C)  TempSrc:    Oral  Resp: 20 14    Height:   5\' 8"  (1.727 m)   Weight:   77.066 kg (169 lb 14.4 oz)   SpO2: 96% 98%  95%  Head: Positive small laceration, with surrounding mild edema and tenderness in right frontal parietal area. Eyes: PERRL, lids and conjunctivae normal ENMT: Mucous membranes are moist. Posterior pharynx clear of any exudate or lesions.Normal dentition.  Neck: normal, neck collar in place. Respiratory: clear to auscultation bilaterally, no wheezing, no crackles. Normal respiratory effort. No accessory muscle use.  Cardiovascular: Regular rate and rhythm, no murmurs / rubs / gallops. No extremity edema. 2+ pedal pulses. No carotid bruits.  Abdomen: no tenderness, no masses palpated. No hepatosplenomegaly. Bowel sounds positive.  Musculoskeletal: no clubbing / cyanosis. No joint deformity upper and lower extremities. Good ROM, no contractures. Normal muscle tone.  Skin: Positive abrasion on right side of forehead. Neurologic: CN 2-12 grossly intact. Sensation intact, DTR normal. Strength 5/5 in all 4.  Psychiatric: Normal judgment and insight. Alert and oriented x 3. Normal mood.    Labs on Admission: I have personally reviewed following labs and imaging studies  CBC:  Recent Labs Lab 10/04/15 1332  WBC 7.2  NEUTROABS 5.4  HGB 15.9  HCT 44.7  MCV 89.9  PLT 123456   Basic Metabolic Panel:  Recent Labs Lab 10/04/15 1332  NA 140  K 3.8  CL 103  CO2 24  GLUCOSE 167*  BUN 13  CREATININE 1.15  CALCIUM 9.6   GFR: Estimated Creatinine Clearance: 56.2 mL/min (by C-G formula based on Cr of 1.15). Liver Function Tests:  Recent Labs Lab 10/04/15 1332  AST 27  ALT 18  ALKPHOS 72  BILITOT 0.8  PROT 6.9  ALBUMIN 3.9   Cardiac Enzymes:  Recent Labs Lab  10/04/15 1335  TROPONINI <0.03   Urine analysis:    Component Value Date/Time   COLORURINE YELLOW 10/04/2015 [place] 10/04/2015 1515   LABSPEC 1.021 10/04/2015 1515   PHURINE 6.0 10/04/2015 1515   GLUCOSEU NEGATIVE 10/04/2015 1515   [place] 10/04/2015 1515   BILIRUBINUR NEGATIVE 10/04/2015 1515   KETONESUR NEGATIVE 10/04/2015 1515   PROTEINUR NEGATIVE 10/04/2015 1515   UROBILINOGEN 0.2 02/26/2014 0920   NITRITE NEGATIVE 10/04/2015 1515   LEUKOCYTESUR NEGATIVE 10/04/2015 1515    Radiological Exams on Admission: Dg Chest 2 View  10/04/2015  CLINICAL DATA:  syncopal episode while sititng down. Reports Friday morning he did the same  thing. Pt reports that he was home alone when it happened. States that he felt lightheaded prior to episode. Denies pain. States that he fell out of his chair and woke up on the floor. Pt is reporting neck pain and a terrible HA right after he fell. Reports that the HA is still present. 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 C-collar applied in triage. Pt A/O x 4, ambulatory. EXAM: CHEST - 2 VIEW COMPARISON:  10/20/2007 FINDINGS: Lungs are clear. Heart size and mediastinal contours are within normal limits. No effusion.  No pneumothorax. Mild endplate spurring in the midthoracic spine. IMPRESSION: No acute disease. Electronically Signed   By: Lucrezia Europe M.D.   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 Multiplanar CT image reconstructions of the cervical spine were also generated.  COMPARISON:  None. FINDINGS: CT HEAD FINDINGS There is mild central and cortical atrophy. Periventricular white matter changes are consistent with small vessel disease. There is no intra or extra-axial fluid collection or mass lesion. The basilar cisterns and ventricles have a normal appearance. There is no CT evidence for acute infarction or hemorrhage. Bone windows demonstrate no calvarial fracture. There is mild frontal scalp edema but no underlying calvarial fracture. CT CERVICAL SPINE FINDINGS There are moderate degenerative changes in the mid cervical spine. Disc height loss and bilateral foraminal narrowing noted at C5-6 and C6-7. There is no evidence for acute fracture or subluxation. There is moderate and S associated with C1-2 articulation. No evidence for acute fracture or traumatic subluxation. IMPRESSION: 1. Atrophy and small vessel disease. 2.  No evidence for acute intracranial abnormality. 3. Frontal scalp edema without underlying calvarial fracture. 4. Moderate cervical degenerative changes. 5.  No evidence for acute cervical spine abnormality. Electronically Signed   By: Nolon Nations M.D.   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 Multiplanar CT image reconstructions of the cervical spine were also generated. COMPARISON:  None. FINDINGS: CT HEAD FINDINGS There is mild central and cortical atrophy. Periventricular white matter changes are consistent with small vessel disease. There is no intra or extra-axial fluid  collection or mass lesion. The basilar cisterns and ventricles have a normal appearance. There is no CT evidence for acute infarction or hemorrhage. Bone windows demonstrate no calvarial fracture. There is mild frontal scalp edema but no underlying calvarial fracture. CT CERVICAL SPINE FINDINGS There are moderate degenerative changes in the mid cervical spine. Disc height loss and bilateral foraminal narrowing noted at C5-6 and C6-7. There is no evidence for acute fracture or subluxation. There is moderate and S associated with C1-2 articulation.  No evidence for acute fracture or traumatic subluxation. IMPRESSION: 1. Atrophy and small vessel disease. 2.  No evidence for acute intracranial abnormality. 3. Frontal scalp edema without underlying calvarial fracture. 4. Moderate cervical degenerative changes. 5.  No evidence for acute cervical spine abnormality. Electronically Signed   By: Nolon Nations M.D.   On: 10/04/2015 14:14    EKG: Independently reviewed.  Vent. rate 93 BPM PR interval 164 ms QRS duration 90 ms QT/QTc 348/432 ms P-R-T axes 55 -15 63 Normal sinus rhythm Anterolateral infarct , age undetermined Abnormal ECG  Assessment/Plan Principal Problem:   Syncope and collapse Admit to telemetry as/observation. Gentle IV hydration. Serial troponin levels. Check echocardiogram in a.m. Check carotid Doppler in a.m. Hold Cardura for now. Consider switching Cardura to Flomax or splitting dosage to 4 mg by mouth twice a day.  Active Problems:   Depression Stable. Continue bupropion 450 mg by mouth every morning. Continue cytology plan 20 mg by mouth daily    H/O hemicolectomy   Enteritis Since the patient was on antibiotics for bronchitis and has had diarrhea since then,  I will check C. difficile toxin. Continue IV hydration. Hold propantheline until C diff results are back.    Volume depletion Continue IV fluids.    Headache and neck pain. Continue celebrex. Low dose  tramadol as needed. Muscle relaxant at bedtime.    Hyperglycemia. Check fasting glucose in the morning.     DVT prophylaxis: Lovenox SQ. Code Status: Full code. Family Communication: His daughter Colletta [place] was present in the room. Disposition Plan: Admit for syncope workup. Further disposition pending results. Consults called: None. Admission status: Observation/telemetry   Reubin Milan MD [facility] Pager [phone].  If 7PM-7AM, please contact night-coverage [email or web address] Password [facility]  10/04/2015, 8:32 PM

## 2015-10-04 NOTE — ED Notes (Signed)
MD at bedside. 

## 2015-10-04 NOTE — ED Provider Notes (Signed)
CSN: [id number]     Arrival date & time 10/04/15  1251 History   First MD Initiated Contact with Patient 10/04/15 1316     Chief Complaint  Patient presents with  . Loss of Consciousness      Patient is a 75 y.o. male presenting with syncope. The history is provided by the patient.  Loss of Consciousness Associated symptoms: headaches   Associated symptoms: no chest pain, no nausea, no shortness of breath, no vomiting and no weakness   Patient presents with syncope. States that 4 days ago and then again 3 years ago he had a syncopal episode. States his mouth began to feel wet and then just passed out. Awoke from pretty quickly. On the one fall he fell and hit the back was head. Since that is had pain in his head and neck. If you have difficulty turning his head and his head feels that the weighs around 50 pounds. No localizing numbness or weakness. No confusion. No chest pain. No abdominal pain. No recent nausea vomiting or diarrhea. No rash. No vision changes. No difficulty with ambulation now. He's not had had syncopal episodes like this before.   Past Medical History  Diagnosis Date  . Arthritis   . 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 Small bowel obstruction [facility])    Past Surgical History  Procedure Laterality Date  . Hemicolectomy    . Skin cancer removed from nose     . Joint replacement      left knee   . 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 No pertinent family history. Social History  Substance Use Topics  . Smoking status: Former Research scientist (life sciences)  . Smokeless tobacco: Never Used  . Alcohol Use: Yes     Comment: 6 beers per week    Review of Systems  Constitutional: Negative for activity change and appetite change.  Eyes: Negative for pain.   Respiratory: Negative for chest tightness and shortness of breath.   Cardiovascular: Positive for syncope. Negative for chest pain and leg swelling.  Gastrointestinal: Negative for nausea, vomiting, abdominal pain and diarrhea.  Genitourinary: Negative for flank pain.  Musculoskeletal: Positive for neck pain. Negative for back pain and neck stiffness.  Skin: Negative for rash.  Neurological: Positive for syncope and headaches. Negative for weakness and numbness.  Psychiatric/Behavioral: Negative for behavioral problems.      Allergies  Review of patient's allergies indicates no known allergies.  Home Medications   Prior to Admission medications   Medication Sig Start Date End Date Taking? Authorizing Provider  propantheline (PROBANTHINE) 15 MG tablet Take 20 mg by mouth 3 (three) times daily with meals.   Yes Historical Provider, MD  ANDROGEL PUMP 20.25 MG/ACT (1.62%) GEL Apply 1 application topically 3 (three) times a week. 09/08/14   Historical Provider, MD  aspirin 81 MG chewable tablet Chew 81 mg by mouth daily.    Historical Provider, MD  buPROPion (WELLBUTRIN XL) 150 MG 24 hr tablet Take 450 mg by mouth every morning.    Historical Provider, MD  celecoxib (CELEBREX) 200 MG capsule Take 200 mg by mouth daily.    Historical Provider, MD  citalopram (CELEXA) 20 MG tablet Take 20 mg by mouth at bedtime.    Historical  Provider, MD  doxazosin (CARDURA) 8 MG tablet Take 8 mg by mouth daily.     Historical Provider, MD  finasteride (PROSCAR) 5 MG tablet Take 5 mg by mouth every morning.    Historical Provider, MD  naproxen sodium (ANAPROX) 220 MG tablet Take 220 mg by mouth 2 (two) times daily with a meal.    Historical Provider, MD   BP 130/76 mmHg  Pulse 84  Temp(Src) 98.4 F (36.9 C) (Oral)  Resp 20  Ht 5\' 8"  (1.727 m)  Wt 173 lb (78.472 kg)  BMI 26.31 kg/m2  SpO2 95% Physical Exam  Constitutional: He appears well-developed and well-nourished.  HENT:  Head: Normocephalic.   Eyes: Pupils are equal, round, and reactive to light.  Neck: Neck supple.  Cardiovascular: Normal rate.   Musculoskeletal:  No tenderness to skull, no cervical spine tenderness but has decreased range of motion with rotation, particularly to left.  Neurological: He is alert.  Neurovascular intact bilateral hands and lower extremities. Extraocular movements intact.  Skin: Skin is warm.    ED Course  Procedures (including critical care time) Labs Review Labs Reviewed  COMPREHENSIVE METABOLIC PANEL - Abnormal; Notable for the following:    Glucose, Bld 167 (*)    All other components within normal limits  TROPONIN I  CBC WITH DIFFERENTIAL/PLATELET  URINALYSIS, ROUTINE W REFLEX MICROSCOPIC (NOT AT [facility])    Imaging Review Dg Chest 2 View  10/04/2015  CLINICAL DATA:  syncopal episode while sititng down. Reports Friday morning he did the same thing. Pt reports that he was home alone when it happened. States that he felt lightheaded prior to episode. Denies pain. States that he fell out of his chair and woke up on the floor. Pt is reporting neck pain and a terrible HA right after he fell. Reports that the HA is still present. 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 C-collar applied in triage. Pt A/O x 4, ambulatory. EXAM: CHEST - 2 VIEW COMPARISON:  10/20/2007 FINDINGS: Lungs are clear. Heart size and mediastinal contours are within normal limits. No effusion.  No pneumothorax. Mild endplate spurring in the midthoracic spine. IMPRESSION: No acute disease. Electronically Signed   By: Lucrezia Europe M.D.   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 Multiplanar CT image reconstructions of the cervical spine were also generated. COMPARISON:  None. FINDINGS: CT HEAD FINDINGS There is mild central and cortical atrophy. Periventricular white matter changes are consistent with small vessel disease. There is no intra or extra-axial fluid collection or mass lesion. The basilar cisterns and ventricles have a normal appearance. There is no CT evidence for acute infarction or hemorrhage. Bone windows demonstrate no calvarial fracture. There is mild frontal scalp edema but no underlying calvarial fracture. CT CERVICAL SPINE FINDINGS There are moderate degenerative changes in the mid cervical spine. Disc height loss and bilateral foraminal narrowing noted at C5-6 and C6-7. There is no evidence for acute fracture or subluxation. There is moderate and S associated with C1-2 articulation. No evidence for acute fracture or  traumatic subluxation. IMPRESSION: 1. Atrophy and small vessel disease. 2.  No evidence for acute intracranial abnormality. 3. Frontal scalp edema without underlying calvarial fracture. 4. Moderate cervical degenerative changes. 5.  No evidence for acute cervical spine abnormality. Electronically Signed   By: Nolon Nations M.D.   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 Multiplanar CT image reconstructions of the cervical spine were also generated. COMPARISON:   None. FINDINGS: CT HEAD FINDINGS There is mild central and cortical atrophy. Periventricular white matter changes are consistent with small vessel disease. There is no intra or extra-axial fluid collection or mass lesion. The basilar cisterns and ventricles have a normal appearance. There is no CT evidence for acute infarction or hemorrhage. Bone windows demonstrate no calvarial fracture. There is mild frontal scalp edema but no underlying calvarial fracture. CT CERVICAL SPINE FINDINGS There are moderate degenerative changes in the mid cervical spine. Disc height loss and bilateral foraminal narrowing noted at C5-6 and C6-7. There is no evidence for acute fracture or subluxation. There is moderate and S associated with C1-2 articulation. No evidence for acute fracture or traumatic subluxation. IMPRESSION: 1. Atrophy and small vessel disease. 2.  No evidence for acute intracranial abnormality. 3. Frontal scalp edema without underlying calvarial fracture. 4. Moderate cervical degenerative changes. 5.  No evidence for acute cervical spine abnormality. Electronically Signed   By: Nolon Nations M.D.   On: 10/04/2015 14:14   I have personally reviewed and evaluated these images and lab results as part of my medical decision-making.   EKG Interpretation   Date/Time:  Sunday October 04 2015 13:11:00 EDT Ventricular Rate:  93 PR Interval:  164 QRS Duration: 90 QT Interval:  348 QTC Calculation: 432 R Axis:   -15 Text Interpretation:  Normal sinus rhythm Anterolateral infarct , age  undetermined Abnormal ECG inferior infarct is old. Confirmed by Alvino Chapel   MD, Ovid Curd [phone]) on 10/04/2015 1:16:39 PM      MDM   Final diagnoses:  Syncope, unspecified syncope type  Cervical strain, initial encounter    Patient  With 2 syncopal episodes. Happened while he was at rest and sitting. EKG and lab work overall reassuring. Did fall and hit head. Continued neck pain with decreased range of motion. Negative  head CT and cervical spine CT but will leave collar on for now and may require neurosurgery follow-up. He has no neurologic deficits at this time. Will admit to [facility] for observation of his syncope.    Davonna Belling, MD 10/04/15 [phone]

## 2015-10-05 ENCOUNTER — Observation Stay (HOSPITAL_BASED_OUTPATIENT_CLINIC_OR_DEPARTMENT_OTHER): Payer: Medicare Other

## 2015-10-05 DIAGNOSIS — F329 Major depressive disorder, single episode, unspecified: Secondary | ICD-10-CM | POA: Diagnosis not present

## 2015-10-05 DIAGNOSIS — R51 Headache: Secondary | ICD-10-CM | POA: Diagnosis not present

## 2015-10-05 DIAGNOSIS — R55 Syncope and collapse: Secondary | ICD-10-CM

## 2015-10-05 DIAGNOSIS — I951 Orthostatic hypotension: Secondary | ICD-10-CM | POA: Diagnosis present

## 2015-10-05 DIAGNOSIS — E869 Volume depletion, unspecified: Secondary | ICD-10-CM

## 2015-10-05 DIAGNOSIS — K529 Noninfective gastroenteritis and colitis, unspecified: Secondary | ICD-10-CM | POA: Diagnosis not present

## 2015-10-05 LAB — ECHOCARDIOGRAM COMPLETE
HEIGHTINCHES: 68 in
WEIGHTICAEL: 2718.4 [oz_av]

## 2015-10-05 LAB — BASIC METABOLIC PANEL
ANION GAP: 4 — AB (ref 5–15)
BUN: 16 mg/dL (ref 6–20)
CALCIUM: 8.3 mg/dL — AB (ref 8.9–10.3)
CO2: 27 mmol/L (ref 22–32)
Chloride: 111 mmol/L (ref 101–111)
Creatinine, Ser: 1.23 mg/dL (ref 0.61–1.24)
GFR calc Af Amer: >60 mL/min (ref >60)
GFR calc non Af Amer: 57 mL/min — ABNORMAL LOW (ref >60)
GLUCOSE: 112 mg/dL — AB (ref 65–99)
Potassium: 4 mmol/L (ref 3.5–5.1)
Sodium: 142 mmol/L (ref 135–145)

## 2015-10-05 LAB — TROPONIN I

## 2015-10-05 MED ORDER — SODIUM CHLORIDE 0.9 % IV SOLN
INTRAVENOUS | Status: DC
  Filled 2015-10-05: qty 1000

## 2015-10-05 MED ORDER — DOXAZOSIN MESYLATE 4 MG PO TABS: 4.0000 mg | ORAL_TABLET | Freq: Every day | ORAL | Status: DC

## 2015-10-05 MED ORDER — KETOROLAC TROMETHAMINE 30 MG/ML IJ SOLN: 30.0000 mg | Freq: Four times a day (QID) | INTRAMUSCULAR | Status: DC | PRN

## 2015-10-05 MED ORDER — ASPIRIN 81 MG PO CHEW
81.0000 mg | CHEWABLE_TABLET | Freq: Every day | ORAL | Status: DC
  Administered 2015-10-05: 81 mg via ORAL
  Filled 2015-10-05: qty 1

## 2015-10-05 MED ORDER — FINASTERIDE 5 MG PO TABS
5.0000 mg | ORAL_TABLET | Freq: Every morning | ORAL | Status: DC
  Administered 2015-10-05: 5 mg via ORAL
  Filled 2015-10-05: qty 1

## 2015-10-05 MED ORDER — CYCLOBENZAPRINE HCL 5 MG PO TABS: 5.0000 mg | ORAL_TABLET | Freq: Three times a day (TID) | ORAL | Status: DC | PRN

## 2015-10-05 MED ORDER — SODIUM CHLORIDE 0.9 % IV SOLN
INTRAVENOUS | Status: DC
Start: 2015-10-05 — End: 2015-10-05
  Administered 2015-10-05: 11:00:00 via INTRAVENOUS

## 2015-10-05 MED ORDER — CELECOXIB 200 MG PO CAPS
200.0000 mg | ORAL_CAPSULE | Freq: Every day | ORAL | Status: DC
  Administered 2015-10-05: 200 mg via ORAL
  Filled 2015-10-05: qty 1

## 2015-10-05 MED ORDER — TRAMADOL HCL 50 MG PO TABS: 50.0000 mg | ORAL_TABLET | Freq: Four times a day (QID) | ORAL | Status: DC | PRN

## 2015-10-05 MED ORDER — CITALOPRAM HYDROBROMIDE 20 MG PO TABS: 20.0000 mg | ORAL_TABLET | Freq: Every day | ORAL | Status: DC

## 2015-10-05 MED ORDER — TRAMADOL HCL 50 MG PO TABS
50.0000 mg | ORAL_TABLET | Freq: Four times a day (QID) | ORAL | Status: DC | PRN
  Administered 2015-10-05: 50 mg via ORAL
  Filled 2015-10-05: qty 1

## 2015-10-05 MED ORDER — BUPROPION HCL ER (XL) 150 MG PO TB24
450.0000 mg | ORAL_TABLET | Freq: Every morning | ORAL | Status: DC
  Administered 2015-10-05: 450 mg via ORAL
  Filled 2015-10-05: qty 1

## 2015-10-05 NOTE — Progress Notes (Signed)
  Echocardiogram 2D Echocardiogram has been performed.  Diamond Nickel 10/05/2015, 9:50 AM

## 2015-10-05 NOTE — Discharge Summary (Addendum)
Physician Discharge Summary  George [name] [id number] DOB: 1941/04/20 DOA: 10/04/2015  PCP: George Frees, MD  Admit date: 10/04/2015 Discharge date: 10/05/2015  Time spent: 65 minutes  Recommendations for Outpatient Follow-up:  1. Follow-up with George Frees, MD in 1 week. On follow-up patient will need orthostatics checked and if patient is still orthostatic and symptomatic may consider discontinuing patient's Cardura as his dose was decreased to half of his home dose on day of discharge. 2. Follow-up with urologist as scheduled.   Discharge Diagnoses:  Principal Problem:   Syncope and collapse Active Problems:   Orthostasis: Probable   Depression   H/O hemicolectomy   Enteritis   Volume depletion   Hyperglycemia   Headache   Discharge Condition: Stable and improved  Diet recommendation: Regular  Filed Weights   10/04/15 1303 10/04/15 1812  Weight: 78.472 kg (173 lb) 77.066 kg (169 lb 14.4 oz)    History of present illness:  Per Dr. Stoney [name] is a 75 y.o. male with medical history significant for BPH, depression, osteoarthritis, history of hemicolectomy, C-spine osteoarthritis Who went to the emergency department at [facility] on the day of admission, due to headache.  Per patient, on Thursday evening and Friday evening he passed out once on both face while he was eating dinner. He has been by himself, but stated that both times that he passed out it was for a brief period of time, since his food was still warm after he woke up. He denied chest pain, palpitations, diaphoresis, PND, orthopnea or pitting edema of the lower extremities. He complained of mild dizziness, transient mild nausea and had been having at least 2 episodes of loose stools for 2-3 weeks, since he was given oral antibiotics to treat his bronchitis. He felt "a little dehydrated".   When asked, why did he go to the emergency department today, instead of after the first or second  syncopal episode, the patient stated that he felt normal after this, except for headache since he believed he hit his head when falling. He stated that his neck pain has exacerbated significantly and also has a headache from recent trauma.   ED Course: CT scan and basic labs were normal, with the exception of hyperglycemia 167 mg of deciliter. However, the patient stated that he ate a muffin sandwich less than 2 hours before the blood samples were taken. He is being admitted for syncope workup.  Hospital Course:  #1 syncope likely secondary to orthostasis Patient was admitted with syncopal episode with complaints of headache. CT head CT C-spine done on admission was negative. Orthostatics was not checked on initial presentation to the ED. Cardiac enzymes were cycled which were negative 3. Patient was admitted to the telemetry floor and monitored with no signs of arrhythmias. Carotid Dopplers were done which were negative for any ICA stenosis. Patient was placed on IV fluids since presentation to the ED and during the hospitalization. 2-D echo was checked with a EF of 55-60% with no wall motion abnormalities, grade 1 diastolic dysfunction, no valvular stenosis or abnormalities. Patient improved clinically and was asymptomatic. Orthostatics was checked the patient had received IV fluids and patient was noted to go from 142/73 in the laying supine position to 125/76 on standing. It was felt patient likely did have a probable orthostasis leading to his syncopal episode as patient was noted to have a few bouts of diarrhea over the past 2-3 weeks prior to admission. Patient was also noted to be on Cardura  8 mg daily which was held during the hospitalization. Patient was hydrated with IV fluids and was euvolemic a day of discharge. Patient will be discharged in stable and improved condition on half his home dose of Cardura at 4 mg daily, and is to follow-up with PCP as outpatient.  #2 depression Remained stable  throughout the hospitalization. Patient was maintained on home regimen of Wellbutrin and Celexa.  #3 enteritis/H/o hemicolectomy Patient had been on antibiotics for bronchitis and had had diarrhea since then over the past 2-3 weeks per patient. Patient did not have any further loose stools and had formed stools during the hospitalization.  #4 dehydration Patient was hydrated with IV fluids and was euvolemic by day of discharge.  #5 headache and neck pain Secondary to fall. CT head and CT C-spine negative for any acute abnormalities. Patient was continued on home regimen of Celebrex. Patient was also placed on a muscle relaxant as needed as well as tramadol.  The rest of patient's chronic medical issues remained stable throughout the hospitalization and patient was discharged in stable and improved condition.  Procedures:  Carotid Dopplers 10/05/2015  2-D echo 10/05/2015  CT head CT C-spine 10/04/2015  Consultations:  None  Discharge Exam: Filed Vitals:   10/05/15 0535 10/05/15 0946  BP: 136/71 142/73  Pulse: 74 71  Temp: 97.9 F (36.6 C)   Resp: 16     General: NAD Cardiovascular: RRR Respiratory: CTAB  Discharge Instructions   Discharge Instructions    Diet general    Complete by:  As directed      Discharge instructions    Complete by:  As directed   Follow up with George Frees, MD in 1 week.     Increase activity slowly    Complete by:  As directed           Current Discharge Medication List    START taking these medications   Details  cyclobenzaprine (FLEXERIL) 5 MG tablet Take 1 tablet (5 mg total) by mouth 3 (three) times daily as needed for muscle spasms. Qty: 15 tablet, Refills: 0    traMADol (ULTRAM) 50 MG tablet Take 1 tablet (50 mg total) by mouth every 6 (six) hours as needed for moderate pain or severe pain. Qty: 20 tablet, Refills: 0      CONTINUE these medications which have CHANGED   Details  doxazosin (CARDURA) 4 MG tablet Take 1  tablet (4 mg total) by mouth daily. Qty: 30 tablet, Refills: 0      CONTINUE these medications which have NOT CHANGED   Details  ANDROGEL PUMP 20.25 MG/ACT (1.62%) GEL Apply 1 application topically 3 (three) times a week.    aspirin 81 MG chewable tablet Chew 81 mg by mouth daily.    buPROPion (WELLBUTRIN XL) 150 MG 24 hr tablet Take 450 mg by mouth every morning.    celecoxib (CELEBREX) 200 MG capsule Take 200 mg by mouth daily.    citalopram (CELEXA) 20 MG tablet Take 20 mg by mouth at bedtime.    finasteride (PROSCAR) 5 MG tablet Take 5 mg by mouth every morning.    propantheline (PROBANTHINE) 15 MG tablet Take 20 mg by mouth 3 (three) times daily with meals.       No Known Allergies Follow-up Information    Follow up with George Frees, MD. Schedule an appointment as soon as possible for a visit in 1 week.   Specialty:  Family Medicine   Contact information:   [address] [place] [address] [phone]        The results of significant diagnostics from this hospitalization (including imaging, microbiology, ancillary and laboratory) are listed below for reference.    Significant Diagnostic Studies: Dg Chest 2 View  10/04/2015  CLINICAL DATA:  syncopal episode while sititng down. Reports Friday morning he did the same thing. Pt reports that he was home alone when it happened. States that he felt lightheaded prior to episode. Denies pain. States that he fell out of his chair and woke up on the floor. Pt is reporting neck pain and a terrible HA right after he fell. Reports that the HA is still present. 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 C-collar applied in triage. Pt A/O x 4, ambulatory. EXAM: CHEST - 2 VIEW COMPARISON:  10/20/2007 FINDINGS: Lungs are clear. Heart size and mediastinal contours are within normal limits. No effusion.  No pneumothorax. Mild endplate spurring in the midthoracic spine. IMPRESSION: No acute disease. Electronically Signed   By: Lucrezia Europe M.D.   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 Multiplanar CT image reconstructions of the cervical spine were also generated. COMPARISON:  None. FINDINGS: CT HEAD FINDINGS There is mild central and cortical atrophy. Periventricular white matter changes are consistent with small vessel disease. There is no intra or extra-axial fluid collection or mass lesion. The basilar cisterns and ventricles have a normal appearance. There is no CT evidence for acute infarction or hemorrhage. Bone windows demonstrate no calvarial fracture. There is mild frontal scalp edema but no underlying calvarial fracture. CT CERVICAL SPINE FINDINGS There are moderate degenerative changes in the mid cervical spine. Disc height loss and bilateral foraminal narrowing noted at C5-6 and C6-7. There is no evidence for acute fracture or subluxation. There is moderate and S associated with C1-2 articulation. No evidence for acute fracture or traumatic subluxation. IMPRESSION: 1. Atrophy and small vessel disease. 2.  No evidence for acute intracranial abnormality. 3. Frontal scalp edema without underlying calvarial fracture. 4. Moderate cervical degenerative changes. 5.  No evidence for acute cervical spine abnormality. Electronically Signed   By: Nolon Nations M.D.   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 Multiplanar CT image reconstructions of the cervical spine were also generated. COMPARISON:  None. FINDINGS: CT HEAD FINDINGS There is mild central and cortical atrophy. Periventricular white matter changes are consistent with small vessel disease. There is no intra or extra-axial fluid collection or mass lesion. The basilar cisterns and ventricles have a normal appearance. There is no CT evidence for acute infarction or  hemorrhage. Bone windows demonstrate no calvarial fracture. There is mild frontal scalp edema but no underlying calvarial fracture. CT CERVICAL SPINE FINDINGS There are moderate degenerative changes in the mid cervical spine. Disc height loss and bilateral foraminal narrowing noted at C5-6 and C6-7. There is no evidence for acute fracture or subluxation. There is moderate and S associated with C1-2 articulation. No evidence for acute fracture or traumatic subluxation. IMPRESSION: 1. Atrophy and small vessel disease. 2.  No evidence for acute intracranial abnormality. 3. Frontal scalp edema without underlying calvarial fracture. 4. Moderate cervical degenerative changes. 5.  No evidence for acute cervical spine abnormality. Electronically Signed   By: Nolon Nations M.D.   On: 10/04/2015 14:14    Microbiology: No results found for this or any previous visit (from the past 240 hour(s)).   Labs: Basic Metabolic Panel:  Recent Labs Lab 10/04/15 1332 10/04/15 1908 10/05/15 0642  NA 140  --  142  K 3.8  --  4.0  CL 103  --  111  CO2 24  --  27  GLUCOSE 167*  --  112*  BUN 13  --  16  CREATININE 1.15  --  1.23  CALCIUM 9.6  --  8.3*  MG  --  1.8  --   PHOS   --  3.9  --    Liver Function Tests:  Recent Labs Lab 10/04/15 1332  AST 27  ALT 18  ALKPHOS 72  BILITOT 0.8  PROT 6.9  ALBUMIN 3.9   No results for input(s): LIPASE, AMYLASE in the last 168 hours. No results for input(s): AMMONIA in the last 168 hours. CBC:  Recent Labs Lab 10/04/15 1332  WBC 7.2  NEUTROABS 5.4  HGB 15.9  HCT 44.7  MCV 89.9  PLT 215   Cardiac Enzymes:  Recent Labs Lab 10/04/15 1335 10/04/15 1908 10/05/15 0124  TROPONINI <0.03 <0.03 <0.03   BNP: BNP (last 3 results) No results for input(s): BNP in the last 8760 hours.  ProBNP (last 3 results) No results for input(s): PROBNP in the last 8760 hours.  CBG: No results for input(s): GLUCAP in the last 168 hours.     SignedIrine Seal MD.  [facility] 10/05/2015, 5:06 PM

## 2015-10-05 NOTE — Progress Notes (Signed)
VASCULAR LAB PRELIMINARY  PRELIMINARY  PRELIMINARY  PRELIMINARY  Carotid duplex  completed.    Preliminary report:  Bilateral:  1-39% ICA stenosis.  Vertebral artery flow is antegrade.      Tylyn Derwin, RVT 10/05/2015, 10:24 AM

## 2015-10-09 DIAGNOSIS — R55 Syncope and collapse: Secondary | ICD-10-CM | POA: Diagnosis not present

## 2015-10-15 IMAGING — CT CT ABD-PELV W/ CM
1 of 3 series · 14 of 32 positions shown, 19 images · IV contrast (OMNIPAQUE 300)
Comparison: CT scan dated 06/29/2014

CLINICAL DATA: Abdominal pain.

EXAM:
CT ABDOMEN AND PELVIS WITH CONTRAST
TECHNIQUE: Multidetector CT imaging of the abdomen and pelvis was performed
using the standard protocol following bolus administration of
intravenous contrast.
CONTRAST:  100mL OMNIPAQUE IOHEXOL 300 MG/ML  SOLN

[Series 2: abd/pel with · axial · 0.80mm/px · z∈[+666,+1062]mm · 14 of 89 slices shown, 19 images]
[im 5/89  soft-tissue]
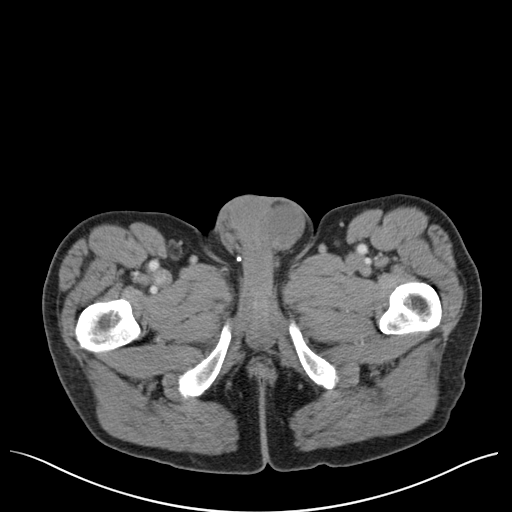
[im 5/89  bone]
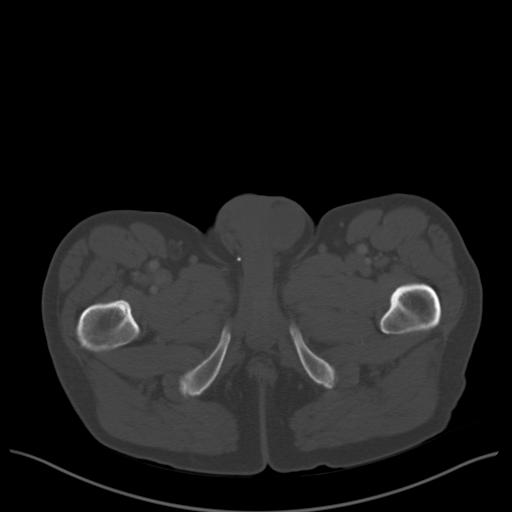
[im 10/89  soft-tissue]
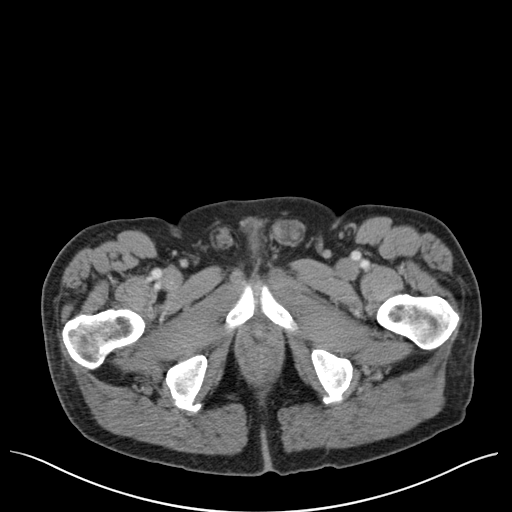
[im 20/89  soft-tissue]
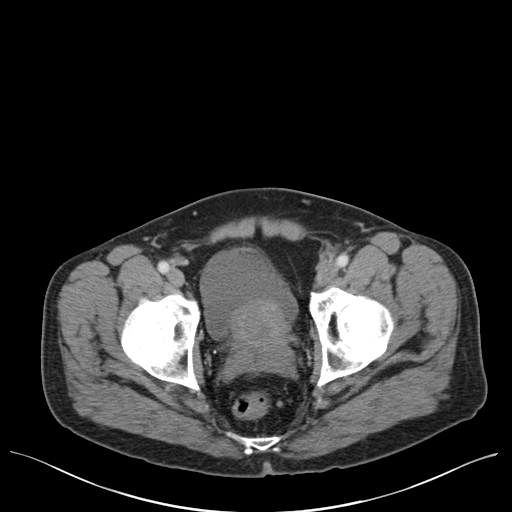
[im 25/89  soft-tissue]
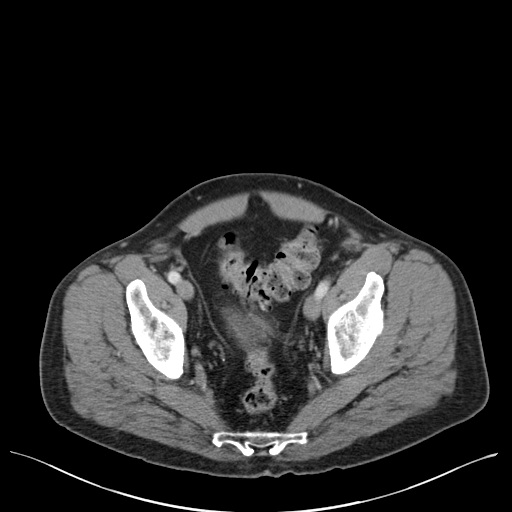
[im 30/89  soft-tissue]
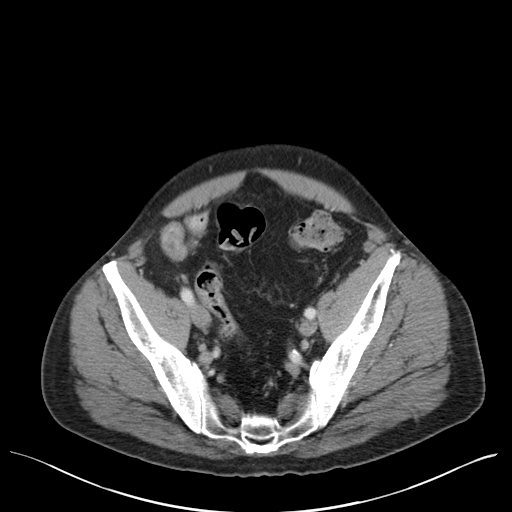
[im 40/89  soft-tissue]
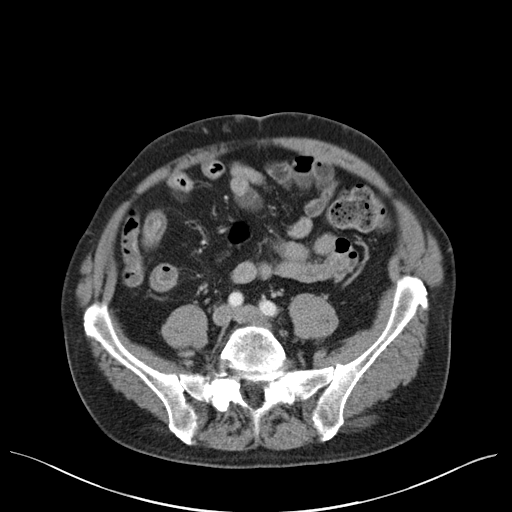
[im 45/89  soft-tissue]
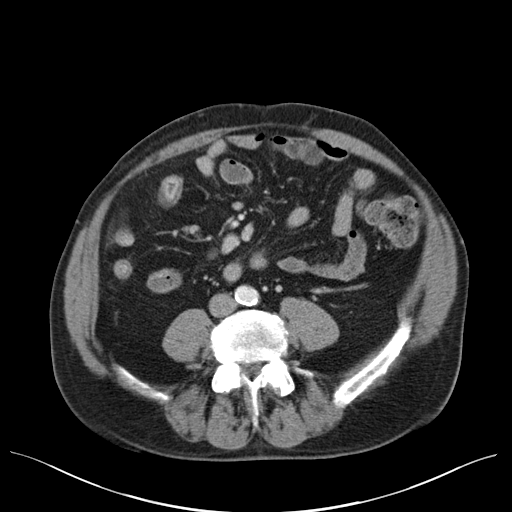
[im 49/89  soft-tissue]
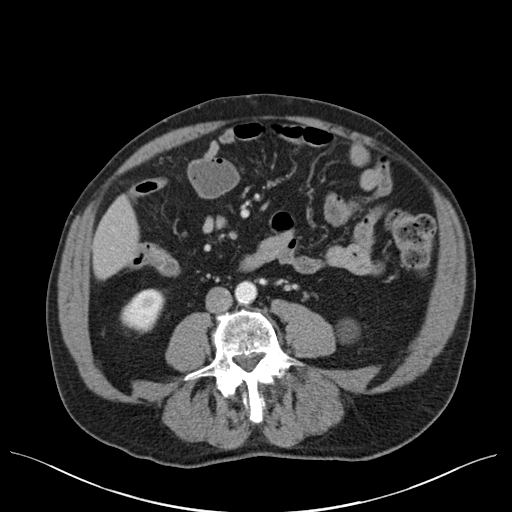
[im 59/89  soft-tissue]
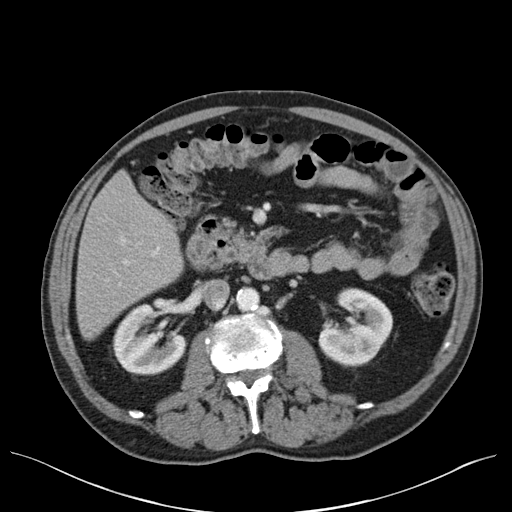
[im 59/89  bone]
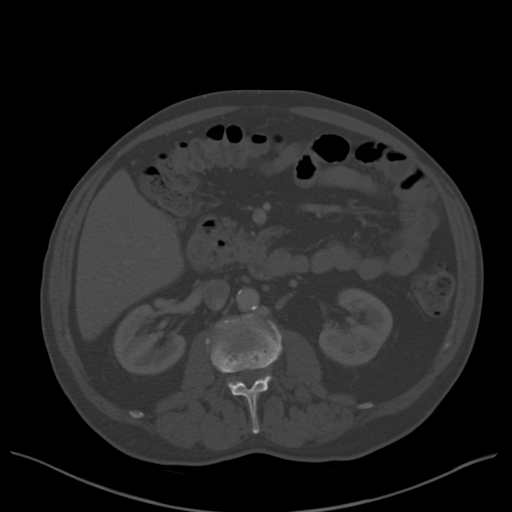
[im 64/89  soft-tissue]
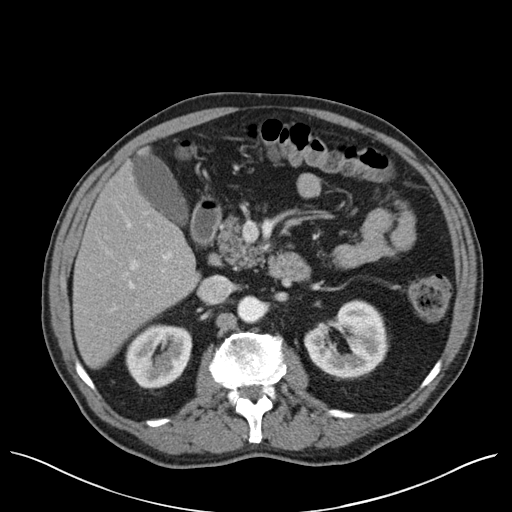
[im 69/89  soft-tissue]
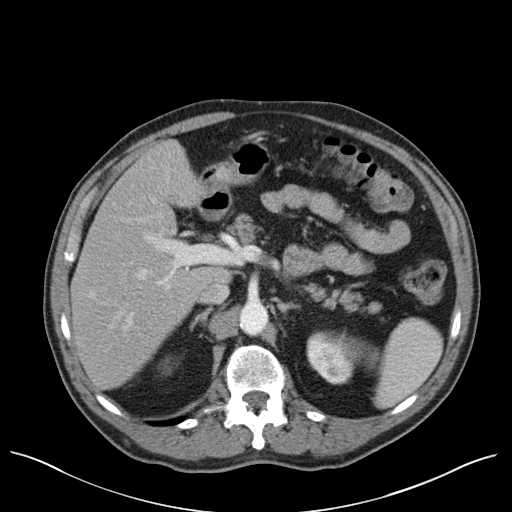
[im 69/89  lung]
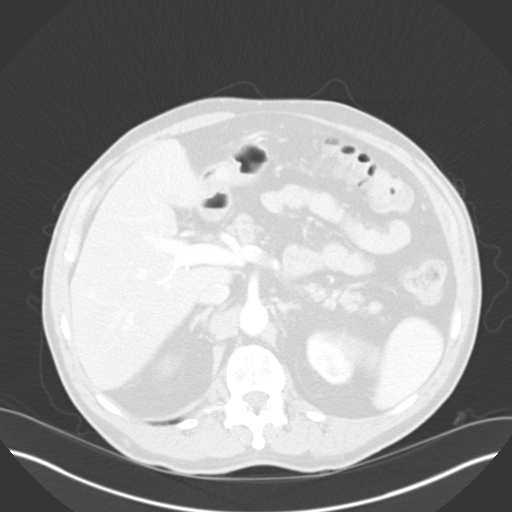
[im 74/89  lung]
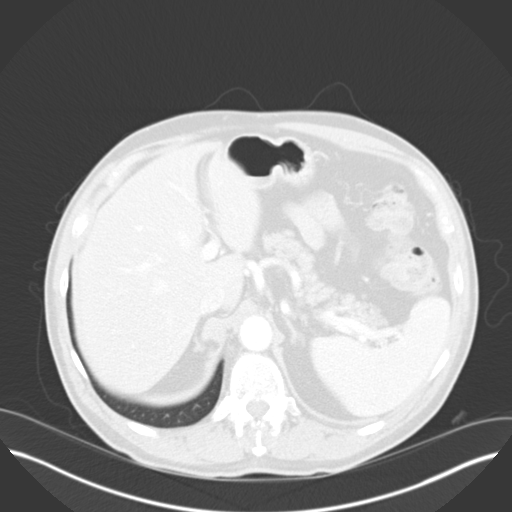
[im 79/89  soft-tissue]
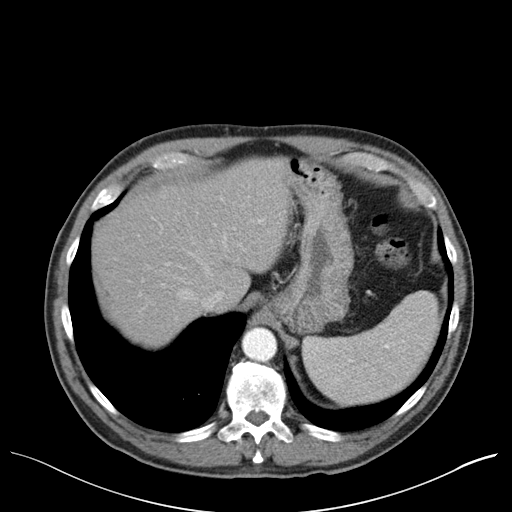
[im 79/89  lung]
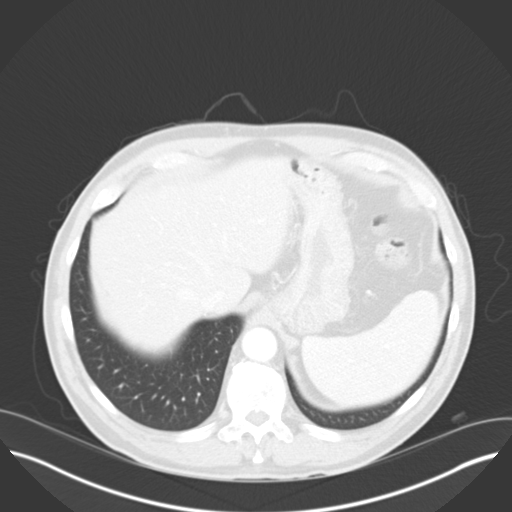
[im 84/89  soft-tissue]
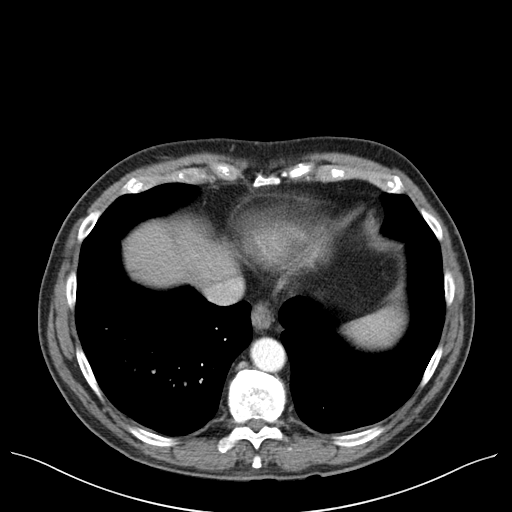
[im 84/89  lung]
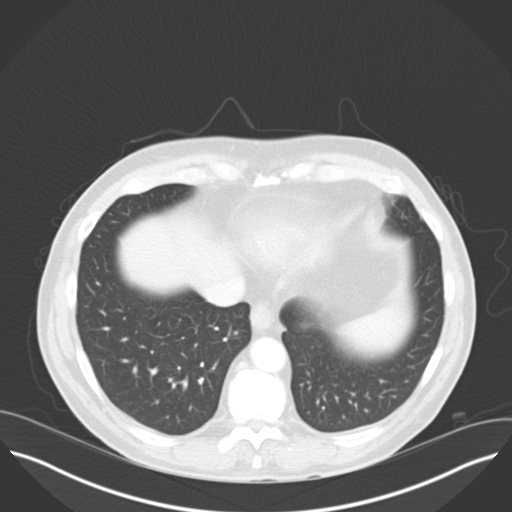

[14 of 32 positions shown; findings below may reference images not displayed]

FINDINGS: The liver, biliary tree, spleen, pancreas, adrenal glands, and right
kidney are normal. There are 3 simple cysts in the left kidney
measuring 13, 24, and 29 mm, unchanged.

Ascending colon has been resected. There is slight chronic mucosal
thickening of distal small bowel. No dilated loops of bowel. There
are a few diverticula in the left side of the colon. Bladder is
normal. Prostate gland is enlarged but stable.

No adenopathy. No free air or free fluid. Slight atherosclerotic
disease of the abdominal aorta. Diffuse degenerative disc disease in
the lumbar spine.
IMPRESSION: No acute abnormality. Chronic mucosal thickening of the distal small
bowel.

## 2016-01-01 DIAGNOSIS — L821 Other seborrheic keratosis: Secondary | ICD-10-CM | POA: Diagnosis not present

## 2016-01-01 DIAGNOSIS — C44329 Squamous cell carcinoma of skin of other parts of face: Secondary | ICD-10-CM | POA: Diagnosis not present

## 2016-01-01 DIAGNOSIS — D492 Neoplasm of unspecified behavior of bone, soft tissue, and skin: Secondary | ICD-10-CM | POA: Diagnosis not present

## 2016-01-01 DIAGNOSIS — Z08 Encounter for follow-up examination after completed treatment for malignant neoplasm: Secondary | ICD-10-CM | POA: Diagnosis not present

## 2016-01-01 DIAGNOSIS — Z85828 Personal history of other malignant neoplasm of skin: Secondary | ICD-10-CM | POA: Diagnosis not present

## 2016-01-04 DIAGNOSIS — M542 Cervicalgia: Secondary | ICD-10-CM | POA: Diagnosis not present

## 2016-01-04 DIAGNOSIS — M7542 Impingement syndrome of left shoulder: Secondary | ICD-10-CM | POA: Diagnosis not present

## 2016-02-03 DIAGNOSIS — R55 Syncope and collapse: Secondary | ICD-10-CM | POA: Diagnosis not present

## 2016-02-03 DIAGNOSIS — R7301 Impaired fasting glucose: Secondary | ICD-10-CM | POA: Diagnosis not present

## 2016-02-09 DIAGNOSIS — K5730 Diverticulosis of large intestine without perforation or abscess without bleeding: Secondary | ICD-10-CM | POA: Diagnosis not present

## 2016-02-09 DIAGNOSIS — Z800 Family history of malignant neoplasm of digestive organs: Secondary | ICD-10-CM | POA: Diagnosis not present

## 2016-02-09 DIAGNOSIS — Z86010 Personal history of colonic polyps: Secondary | ICD-10-CM | POA: Diagnosis not present

## 2016-02-11 ENCOUNTER — Emergency Department (HOSPITAL_BASED_OUTPATIENT_CLINIC_OR_DEPARTMENT_OTHER): Payer: Medicare Other

## 2016-02-11 ENCOUNTER — Encounter (HOSPITAL_BASED_OUTPATIENT_CLINIC_OR_DEPARTMENT_OTHER): Payer: Self-pay | Admitting: Emergency Medicine

## 2016-02-11 ENCOUNTER — Inpatient Hospital Stay (HOSPITAL_BASED_OUTPATIENT_CLINIC_OR_DEPARTMENT_OTHER)
Admission: EM | Admit: 2016-02-11 | Discharge: 2016-02-13 | DRG: 390 | Disposition: A | Discharge status: Home / Self Care | Payer: Medicare Other | Attending: Internal Medicine | Admitting: Internal Medicine

## 2016-02-11 DIAGNOSIS — F329 Major depressive disorder, single episode, unspecified: Secondary | ICD-10-CM | POA: Diagnosis not present

## 2016-02-11 DIAGNOSIS — Z9049 Acquired absence of other specified parts of digestive tract: Secondary | ICD-10-CM

## 2016-02-11 DIAGNOSIS — K56609 Unspecified intestinal obstruction, unspecified as to partial versus complete obstruction: Secondary | ICD-10-CM | POA: Diagnosis present

## 2016-02-11 DIAGNOSIS — R739 Hyperglycemia, unspecified: Secondary | ICD-10-CM | POA: Diagnosis present

## 2016-02-11 DIAGNOSIS — K5669 Other intestinal obstruction: Secondary | ICD-10-CM

## 2016-02-11 DIAGNOSIS — F32A Depression, unspecified: Secondary | ICD-10-CM | POA: Diagnosis present

## 2016-02-11 DIAGNOSIS — Z85828 Personal history of other malignant neoplasm of skin: Secondary | ICD-10-CM | POA: Diagnosis not present

## 2016-02-11 DIAGNOSIS — Z87891 Personal history of nicotine dependence: Secondary | ICD-10-CM | POA: Diagnosis not present

## 2016-02-11 DIAGNOSIS — R109 Unspecified abdominal pain: Secondary | ICD-10-CM | POA: Diagnosis not present

## 2016-02-11 DIAGNOSIS — I10 Essential (primary) hypertension: Secondary | ICD-10-CM | POA: Diagnosis present

## 2016-02-11 DIAGNOSIS — Z85038 Personal history of other malignant neoplasm of large intestine: Secondary | ICD-10-CM | POA: Diagnosis not present

## 2016-02-11 DIAGNOSIS — N400 Enlarged prostate without lower urinary tract symptoms: Secondary | ICD-10-CM | POA: Diagnosis not present

## 2016-02-11 DIAGNOSIS — K5660 Unspecified intestinal obstruction: Secondary | ICD-10-CM | POA: Diagnosis present

## 2016-02-11 DIAGNOSIS — R1909 Other intra-abdominal and pelvic swelling, mass and lump: Secondary | ICD-10-CM | POA: Diagnosis not present

## 2016-02-11 DIAGNOSIS — R110 Nausea: Secondary | ICD-10-CM | POA: Diagnosis not present

## 2016-02-11 DIAGNOSIS — Z809 Family history of malignant neoplasm, unspecified: Secondary | ICD-10-CM | POA: Diagnosis not present

## 2016-02-11 DIAGNOSIS — Z833 Family history of diabetes mellitus: Secondary | ICD-10-CM | POA: Diagnosis not present

## 2016-02-11 DIAGNOSIS — Z96651 Presence of right artificial knee joint: Secondary | ICD-10-CM | POA: Diagnosis present

## 2016-02-11 LAB — BASIC METABOLIC PANEL
Anion gap: 7 (ref 5–15)
BUN: 22 mg/dL — AB (ref 6–20)
CHLORIDE: 105 mmol/L (ref 101–111)
CO2: 28 mmol/L (ref 22–32)
CREATININE: 1.14 mg/dL (ref 0.61–1.24)
Calcium: 9.3 mg/dL (ref 8.9–10.3)
GFR calc Af Amer: >60 mL/min (ref >60)
GFR calc non Af Amer: >60 mL/min (ref >60)
GLUCOSE: 131 mg/dL — AB (ref 65–99)
Potassium: 3.9 mmol/L (ref 3.5–5.1)
SODIUM: 140 mmol/L (ref 135–145)

## 2016-02-11 LAB — CBC WITH DIFFERENTIAL/PLATELET
Basophils Absolute: 0 K/uL (ref 0.0–0.1)
Basophils Relative: 0 %
Eosinophils Absolute: 0.1 K/uL (ref 0.0–0.7)
Eosinophils Relative: 1 %
HCT: 44.2 % (ref 39.0–52.0)
Hemoglobin: 15.7 g/dL (ref 13.0–17.0)
Lymphocytes Relative: 10 %
Lymphs Abs: 1.1 K/uL (ref 0.7–4.0)
MCH: 31.8 pg (ref 26.0–34.0)
MCHC: 35.5 g/dL (ref 30.0–36.0)
MCV: 89.7 fL (ref 78.0–100.0)
Monocytes Absolute: 0.9 K/uL (ref 0.1–1.0)
Monocytes Relative: 8 %
Neutro Abs: 8.8 K/uL — ABNORMAL HIGH (ref 1.7–7.7)
Neutrophils Relative %: 81 %
Platelets: 212 K/uL (ref 150–400)
RBC: 4.93 MIL/uL (ref 4.22–5.81)
RDW: 12.4 % (ref 11.5–15.5)
WBC: 10.9 K/uL — ABNORMAL HIGH (ref 4.0–10.5)

## 2016-02-11 LAB — APTT: APTT: 31 s (ref 24–36)

## 2016-02-11 LAB — GLUCOSE, CAPILLARY: GLUCOSE-CAPILLARY: 105 mg/dL — AB (ref 65–99)

## 2016-02-11 LAB — PROTIME-INR
INR: 1.02
PROTHROMBIN TIME: 13.4 s (ref 11.4–15.2)

## 2016-02-11 MED ORDER — DOXAZOSIN MESYLATE 8 MG PO TABS
8.0000 mg | ORAL_TABLET | Freq: Every day | ORAL | Status: DC
  Administered 2016-02-11 – 2016-02-13 (×3): 8 mg via ORAL
  Filled 2016-02-11 (×4): qty 1

## 2016-02-11 MED ORDER — HYDRALAZINE HCL 20 MG/ML IJ SOLN: 5.0000 mg | INTRAMUSCULAR | Status: DC | PRN

## 2016-02-11 MED ORDER — FENTANYL CITRATE (PF) 100 MCG/2ML IJ SOLN: 50.0000 ug | INTRAMUSCULAR | Status: DC | PRN

## 2016-02-11 MED ORDER — SODIUM CHLORIDE 0.9 % IV SOLN
Freq: Once | INTRAVENOUS | Status: AC
Start: 2016-02-11 — End: 2016-02-11
  Administered 2016-02-11: 06:00:00 via INTRAVENOUS

## 2016-02-11 MED ORDER — SODIUM CHLORIDE 0.9 % IV SOLN: INTRAVENOUS | Status: DC

## 2016-02-11 MED ORDER — TESTOSTERONE 20.25 MG/ACT (1.62%) TD GEL: 1.0000 "application " | TRANSDERMAL | Status: DC

## 2016-02-11 MED ORDER — ONDANSETRON HCL 4 MG/2ML IJ SOLN: 4.0000 mg | Freq: Three times a day (TID) | INTRAMUSCULAR | Status: DC | PRN

## 2016-02-11 MED ORDER — ONDANSETRON HCL 4 MG/2ML IJ SOLN
4.0000 mg | Freq: Once | INTRAMUSCULAR | Status: AC
  Administered 2016-02-11: 4 mg via INTRAVENOUS
  Filled 2016-02-11: qty 2

## 2016-02-11 MED ORDER — ASPIRIN 81 MG PO CHEW
81.0000 mg | CHEWABLE_TABLET | Freq: Every day | ORAL | Status: DC
  Administered 2016-02-11 – 2016-02-13 (×3): 81 mg via ORAL
  Filled 2016-02-11 (×3): qty 1

## 2016-02-11 MED ORDER — CITALOPRAM HYDROBROMIDE 20 MG PO TABS
20.0000 mg | ORAL_TABLET | ORAL | Status: DC
  Administered 2016-02-12: 20 mg via ORAL
  Filled 2016-02-11 (×3): qty 1

## 2016-02-11 MED ORDER — FENTANYL CITRATE (PF) 100 MCG/2ML IJ SOLN
100.0000 ug | Freq: Once | INTRAMUSCULAR | Status: AC
  Administered 2016-02-11: 100 ug via INTRAVENOUS
  Filled 2016-02-11: qty 2

## 2016-02-11 MED ORDER — FINASTERIDE 5 MG PO TABS
5.0000 mg | ORAL_TABLET | Freq: Every morning | ORAL | Status: DC
  Administered 2016-02-11 – 2016-02-13 (×3): 5 mg via ORAL
  Filled 2016-02-11 (×3): qty 1

## 2016-02-11 MED ORDER — MORPHINE SULFATE (PF) 2 MG/ML IV SOLN: 2.0000 mg | Freq: Four times a day (QID) | INTRAVENOUS | Status: DC | PRN

## 2016-02-11 MED ORDER — DEXTROSE-NACL 5-0.45 % IV SOLN
INTRAVENOUS | Status: DC
  Administered 2016-02-11 – 2016-02-12 (×2): via INTRAVENOUS

## 2016-02-11 MED ORDER — BUPROPION HCL ER (XL) 150 MG PO TB24
450.0000 mg | ORAL_TABLET | Freq: Every morning | ORAL | Status: DC
  Administered 2016-02-11 – 2016-02-13 (×3): 450 mg via ORAL
  Filled 2016-02-11 (×3): qty 3

## 2016-02-11 MED ORDER — HEPARIN SODIUM (PORCINE) 5000 UNIT/ML IJ SOLN
5000.0000 [IU] | Freq: Three times a day (TID) | INTRAMUSCULAR | Status: DC
  Administered 2016-02-11 – 2016-02-13 (×6): 5000 [IU] via SUBCUTANEOUS
  Filled 2016-02-11 (×6): qty 1

## 2016-02-11 MED ORDER — KETOROLAC TROMETHAMINE 15 MG/ML IJ SOLN: 15.0000 mg | Freq: Four times a day (QID) | INTRAMUSCULAR | Status: DC | PRN

## 2016-02-11 MED ORDER — TRAMADOL HCL 50 MG PO TABS: 50.0000 mg | ORAL_TABLET | Freq: Four times a day (QID) | ORAL | Status: DC | PRN

## 2016-02-11 MED ORDER — ONDANSETRON HCL 4 MG/2ML IJ SOLN: 4.0000 mg | Freq: Four times a day (QID) | INTRAMUSCULAR | Status: DC | PRN

## 2016-02-11 MED ORDER — CELECOXIB 200 MG PO CAPS
200.0000 mg | ORAL_CAPSULE | Freq: Every day | ORAL | Status: DC
  Administered 2016-02-11 – 2016-02-13 (×3): 200 mg via ORAL
  Filled 2016-02-11 (×4): qty 1

## 2016-02-11 NOTE — ED Notes (Signed)
IV attempted in left forearm unsuccessful.

## 2016-02-11 NOTE — H&P (Signed)
History and Physical    [name] [id number] DOB: 1940/08/16 DOA: 02/11/2016  PCP: [name], MD Patient coming from: Home and [facility] transfer  Chief Complaint: abd pain  HPI: George [name] is a 75 y.o. 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 Started approximately 2 days ago after patient underwent routine colonoscopy. Pain initially and remained but now constant with waxing and waning nature. Associated with abdominal distention. Diffuse throughout abdomen. Associate with nausea and no bowel movement for the last 2 days. Denies emesis. Patient states that this is similar to his previous small bowel obstructions. Denies any fevers, cough, chest pain, shortness of breath, palpitations, dysuria, frequency, suprapubic pain. Nothing makes symptoms better. Nothing makes symptoms worse.  ED Course:  Objective findings outlined below. Patient given fentanyl IVF and Zofran for symptoms. Transfer to [facility] for further management  Review of Systems: As per HPI otherwise 10 point review of systems negative.   Ambulatory Status: No restrictions  Past Medical History:  Diagnosis Date  . Arthritis   . 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 Small bowel obstruction [facility])     Past Surgical History:  Procedure Laterality Date  . HEMICOLECTOMY    . JOINT REPLACEMENT     left knee   . skin cancer removed from nose     . TOTAL KNEE ARTHROPLASTY Right 03/03/2014   Procedure: RIGHT TOTAL KNEE ARTHROPLASTY;  Surgeon: Gearlean Alf, MD;  Location: [facility] ORS;  Service: Orthopedics;  Laterality: Right;    Social History   Social History  . Marital status: Widowed    Spouse name: N/A  . Number of children: N/A  . Years of education: N/A   Occupational History  . Not on file.    Social History Main Topics  . Smoking status: Former Research scientist (life sciences)  . Smokeless tobacco: Never Used  . Alcohol use Yes     Comment: 6 beers per week  . Drug use: No  . Sexual activity: Not on file   Other Topics Concern  . Not on file   Social History Narrative  . No narrative on file    No Known Allergies  Family History  Problem Relation Age of Onset  . Cancer Father   . Diabetes Mellitus II Mother     Prior to Admission medications   Medication Sig Start Date End Date Taking? Authorizing Provider  ANDROGEL PUMP 20.25 MG/ACT (1.62%) GEL Apply 1 application topically 3 (three) times a week. 09/08/14  Yes Historical Provider, MD  aspirin 81 MG chewable tablet Chew 81 mg by mouth daily.   Yes Historical Provider, MD  buPROPion (WELLBUTRIN XL) 150 MG 24 hr tablet Take 450 mg by mouth every morning.   Yes Historical Provider, MD  celecoxib (CELEBREX) 200 MG capsule Take 200 mg by mouth daily.   Yes Historical Provider, MD  citalopram (CELEXA) 20 MG tablet Take 20 mg by mouth every other day.    Yes Historical Provider, MD  doxazosin (CARDURA) 8 MG tablet Take 8 mg by mouth daily. 01/30/16  Yes Historical Provider, MD  finasteride (PROSCAR) 5 MG tablet Take 5 mg by mouth every morning.   Yes Historical Provider, MD  cyclobenzaprine (FLEXERIL) 5 MG tablet Take 1 tablet (5 mg total) by mouth 3 (three) times daily as needed for muscle  spasms. Patient not taking: Reported on 02/11/2016 10/05/15   Eugenie Filler, MD  traMADol (ULTRAM) 50 MG tablet Take 1 tablet (50 mg total) by mouth every 6 (six) hours as needed for moderate pain or severe pain. 10/05/15   Eugenie Filler, MD    Physical Exam: Vitals:   02/11/16 0507 02/11/16 0526 02/11/16 0809 02/11/16 0917  BP: 151/84  110/77 108/60  Pulse: 65  78 77  Resp: 16   17  Temp:  97.7 F (36.5 C)  97.5 F (36.4 C)  TempSrc:  Oral  Oral  SpO2: 96%  96% 97%  Weight: 78.9 kg (174 lb)   81.5 kg (179 lb 11.2 oz)  Height: 5\' 8"  (1.727 m)    5\' 7"  (1.702 m)     General:  Appears calm and comfortable Eyes:  PERRL, EOMI, normal lids, iris ENT:  grossly normal hearing, lips & tongue, mmm Neck:  no LAD, masses or thyromegaly Cardiovascular:  RRR, no m/r/g. No LE edema.  Respiratory:  CTA bilaterally, no w/r/r. Normal respiratory effort. Abdomen:  Hypoactive bowel sounds, distended, diffusely tender with deep palpation Skin:  no rash or induration seen on limited exam Musculoskeletal:  grossly normal tone BUE/BLE, good ROM, no bony abnormality Psychiatric:  grossly normal mood and affect, speech fluent and appropriate, AOx3 Neurologic:  CN 2-12 grossly intact, moves all extremities in coordinated fashion, sensation intact  Labs on Admission: I have personally reviewed following labs and imaging studies  CBC:  Recent Labs Lab 02/11/16 0530  WBC 10.9*  NEUTROABS 8.8*  HGB 15.7  HCT 44.2  MCV 89.7  PLT 99991111   Basic Metabolic Panel:  Recent Labs Lab 02/11/16 0530  NA 140  K 3.9  CL 105  CO2 28  GLUCOSE 131*  BUN 22*  CREATININE 1.14  CALCIUM 9.3   GFR: Estimated Creatinine Clearance: 59.9 mL/min (by C-G formula based on SCr of 1.14 mg/dL). Liver Function Tests: No results for input(s): AST, ALT, ALKPHOS, BILITOT, PROT, ALBUMIN in the last 168 hours. No results for input(s): LIPASE, AMYLASE in the last 168 hours. No results for input(s): AMMONIA in the last 168 hours. Coagulation Profile: No results for input(s): INR, PROTIME in the last 168 hours. Cardiac Enzymes: No results for input(s): CKTOTAL, CKMB, CKMBINDEX, TROPONINI in the last 168 hours. BNP (last 3 results) No results for input(s): PROBNP in the last 8760 hours. HbA1C: No results for input(s): HGBA1C in the last 72 hours. CBG: No results for input(s): GLUCAP in the last 168 hours. Lipid Profile: No results for input(s): CHOL, HDL, LDLCALC, TRIG, CHOLHDL, LDLDIRECT in the last 72 hours. Thyroid Function Tests: No results for input(s): TSH,  T4TOTAL, FREET4, T3FREE, THYROIDAB in the last 72 hours. Anemia Panel: No results for input(s): VITAMINB12, FOLATE, FERRITIN, TIBC, IRON, RETICCTPCT in the last 72 hours. Urine analysis:    Component Value Date/Time   COLORURINE YELLOW 10/04/2015 1515   APPEARANCEUR CLEAR 10/04/2015 1515   LABSPEC 1.021 10/04/2015 1515   PHURINE 6.0 10/04/2015 1515   GLUCOSEU NEGATIVE 10/04/2015 1515   HGBUR NEGATIVE 10/04/2015 1515   BILIRUBINUR NEGATIVE 10/04/2015 1515   KETONESUR NEGATIVE 10/04/2015 1515   PROTEINUR NEGATIVE 10/04/2015 1515   UROBILINOGEN 0.2 02/26/2014 0920   NITRITE NEGATIVE 10/04/2015 1515   LEUKOCYTESUR NEGATIVE 10/04/2015 1515    Creatinine Clearance: Estimated Creatinine Clearance: 59.9 mL/min (by C-G formula based on SCr of 1.14 mg/dL).  Sepsis Labs: @LABRCNTIP (procalcitonin:4,lacticidven:4) )No results found for this or any previous visit (from  the past 240 hour(s)).   Radiological Exams on Admission: Dg Abd Acute W/chest  Result Date: 02/11/2016 CLINICAL DATA:  Mid abdominal pain and swelling for 4 hours. EXAM: DG ABDOMEN ACUTE W/ 1V CHEST COMPARISON:  10/04/2015 FINDINGS: There is abnormal dilatation of mid abdominal small bowel, particularly to the left of midline. There are air-fluid levels. Stool and air is present throughout the colon. No extraluminal air is evident. No biliary or urinary calculi are evident. IMPRESSION: Abnormal small bowel dilatation with air-fluid levels, suspicious for obstruction. Electronically Signed   By: Andreas Newport M.D.   On: 02/11/2016 06:33      Assessment/Plan Active Problems:   BPH (benign prostatic hyperplasia)   Depression   SBO (small bowel obstruction) (HCC)   H/O hemicolectomy   Hyperglycemia   Essential hypertension   SBO: Recurrent since partial colectomy. Confirmed with imaging as outlined above. Currently patient managing secretions without emesis.  - Medical surgical bed - Nothing by mouth accept sips w/  meds - IVF - NG tube if develops emesis or worsening pain - Consider advancing diet on 02/12/2016 - KUB in am  Hypertension: not on any medications. Normotensive - Hydralazine prn - continue ASA  BPH:  - continue proscar, doxazosin  Depression: - continue wellbnutrin, celexa  Hyperglycemia: 131. No reported DM - A1c - CBG Q8   DVT prophylaxis: hep  Code Status: full  Family Communication: none  Disposition Plan: pending improvment. Suspect inpatient stay due 2 midnight stay in hospital and SBO symptoms and needing to advance diet slowly to ensure that SBO has resolved  Consults called: none Admission status: inpt    MERRELL, DAVID J MD [facility]  If 7PM-7AM, please contact night-coverage [email or web address] Password [facility]  02/11/2016, 10:19 AM

## 2016-02-11 NOTE — Care Management Note (Addendum)
Case Management Note  Patient Details  Name: ABHISHEK HUNSICKER MRN: [id number] Date of Birth: May 14, 1941  Subjective/Objective:                 Patient admitted for SBO, NPO, IVF, no NGT at this time. Independent, lives at home alone, has two daughters in town who are supportive. PCP Dr Kenton Kingfisher, uses [facility] on [address].    Action/Plan:  No CM needs identified at this time.   Expected Discharge Date:                  Expected Discharge Plan:  Home/Self Care  In-House Referral:  NA  Discharge planning Services  CM Consult  Post Acute Care Choice:  NA Choice offered to:  NA  DME Arranged:  N/A DME Agency:  NA  HH Arranged:  NA HH Agency:  NA  Status of Service:  Completed, signed off  If discussed at [place] of Stay Meetings, dates discussed:    Additional Comments:  Carles Collet, RN 02/11/2016, 11:33 AM

## 2016-02-11 NOTE — Progress Notes (Signed)
Pt arrived to 5w07, AO4, educated on safety, and call lights. Belongings in closet, attending Marily Memos paged of arrival

## 2016-02-11 NOTE — ED Triage Notes (Signed)
Pt c/o abd pain with abd distention. Pt reports hx of SBO. Pt had colonoscopy x 2 days ago. Pt reports only 1 small BM since colonoscopy.

## 2016-02-11 NOTE — ED Provider Notes (Signed)
[facility] DEPT [facility] Provider Note   CSN: [id number] Arrival date & time: 02/11/16  0502     History   Chief Complaint Chief Complaint  Patient presents with  . Abdominal Pain    HPI George [name] is a 75 y.o. male with a history of small bowel obstruction status post colonoscopy 2 days ago. He is here with abdominal pain and distention that began about 2:30 this morning and has worsened. He states the pain is constant and rates it as a 7 out of 10. The symptoms are the same as with previous small bowel obstructions. He is nauseated but has not vomited. He has not had a significant bowel movement since his colonoscopy. Pain is somewhat worse with movement or palpation.  HPI  Past Medical History:  Diagnosis Date  . Arthritis   . 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 Small bowel obstruction [facility])     Patient Active Problem List   Diagnosis Date Noted  . Orthostasis: Probable 10/05/2015  . Syncope 10/04/2015  . Syncope and collapse 10/04/2015  . Enteritis 10/04/2015  . Volume depletion 10/04/2015  . Hyperglycemia 10/04/2015  . Headache 10/04/2015  . Faintness   . SBO (small bowel obstruction) ([place]) 06/29/2014  . Abdominal pain, epigastric 06/29/2014  . Nausea with vomiting 06/29/2014  . H/O hemicolectomy 06/29/2014  . Hypertrophy of prostate without urinary obstruction and other lower urinary tract symptoms (LUTS) 03/07/2014  . Depression 03/07/2014  . Postoperative anemia due to acute blood loss 03/07/2014  . OA (osteoarthritis) of knee 03/03/2014    Past Surgical History:  Procedure Laterality Date  . HEMICOLECTOMY    . JOINT REPLACEMENT     left knee   . skin cancer removed from nose     . TOTAL KNEE ARTHROPLASTY Right 03/03/2014   Procedure: RIGHT TOTAL KNEE ARTHROPLASTY;  Surgeon: Gearlean Alf, MD;  Location: [facility] ORS;  Service: Orthopedics;  Laterality: Right;       Home  Medications    Prior to Admission medications   Medication Sig Start Date End Date Taking? Authorizing Provider  ANDROGEL PUMP 20.25 MG/ACT (1.62%) GEL Apply 1 application topically 3 (three) times a week. 09/08/14   Historical Provider, MD  aspirin 81 MG chewable tablet Chew 81 mg by mouth daily.    Historical Provider, MD  buPROPion (WELLBUTRIN XL) 150 MG 24 hr tablet Take 450 mg by mouth every morning.    Historical Provider, MD  celecoxib (CELEBREX) 200 MG capsule Take 200 mg by mouth daily.    Historical Provider, MD  citalopram (CELEXA) 20 MG tablet Take 20 mg by mouth at bedtime.    Historical Provider, MD  cyclobenzaprine (FLEXERIL) 5 MG tablet Take 1 tablet (5 mg total) by mouth 3 (three) times daily as needed for muscle spasms. 10/05/15   Eugenie Filler, MD  doxazosin (CARDURA) 4 MG tablet Take 1 tablet (4 mg total) by mouth daily. 10/05/15   Eugenie Filler, MD  finasteride (PROSCAR) 5 MG tablet Take 5 mg by mouth every morning.    Historical Provider, MD  propantheline (PROBANTHINE) 15 MG tablet Take 20 mg by mouth 3 (three) times daily with meals.    Historical Provider, MD  traMADol (ULTRAM) 50 MG tablet Take 1 tablet (50 mg total) by mouth every 6 (six) hours as needed for moderate pain or severe pain. 10/05/15   Eugenie Filler, MD  Family History Family History  Problem Relation Age of Onset  . Cancer Father   . Diabetes Mellitus II Mother     Social History Social History  Substance Use Topics  . Smoking status: Former Research scientist (life sciences)  . Smokeless tobacco: Never Used  . Alcohol use Yes     Comment: 6 beers per week     Allergies   Review of patient's allergies indicates no known allergies.   Review of Systems Review of Systems  All other systems reviewed and are negative.    Physical Exam Updated Vital Signs BP 151/84 (BP Location: Left Arm)   Pulse 65   Temp 97.7 F (36.5 C) (Oral)   Resp 16   Ht 5\' 8"  (1.727 m)   Wt 174 lb (78.9 kg)   SpO2 96%    BMI 26.46 kg/m   Physical Exam General: Well-developed, well-nourished male in no acute distress; appearance consistent with age of record HENT: normocephalic; atraumatic Eyes: pupils equal, round and reactive to light; extraocular muscles intact Neck: supple Heart: regular rate and rhythm Lungs: clear to auscultation bilaterally Abdomen: soft; distended; diffusely tender; bowel sounds absent Extremities: No deformity; full range of motion; pulses normal Neurologic: Awake, alert and oriented; motor function intact in all extremities and symmetric; no facial droop Skin: Warm and dry Psychiatric: Normal mood and affect    ED Treatments / Results   Nursing notes and vitals signs, including pulse oximetry, reviewed.  Summary of this visit's results, reviewed by myself:  Labs:  Results for orders placed or performed during the hospital encounter of 02/11/16 (from the past 24 hour(s))  CBC with Differential/Platelet     Status: Abnormal   Collection Time: 02/11/16  5:30 AM  Result Value Ref Range   WBC 10.9 (H) 4.0 - 10.5 K/uL   RBC 4.93 4.22 - 5.81 MIL/uL   Hemoglobin 15.7 13.0 - 17.0 g/dL   HCT 44.2 39.0 - 52.0 %   MCV 89.7 78.0 - 100.0 fL   MCH 31.8 26.0 - 34.0 pg   MCHC 35.5 30.0 - 36.0 g/dL   RDW 12.4 11.5 - 15.5 %   Platelets 212 150 - 400 K/uL   Neutrophils Relative % 81 %   Neutro Abs 8.8 (H) 1.7 - 7.7 K/uL   Lymphocytes Relative 10 %   Lymphs Abs 1.1 0.7 - 4.0 K/uL   Monocytes Relative 8 %   Monocytes Absolute 0.9 0.1 - 1.0 K/uL   Eosinophils Relative 1 %   Eosinophils Absolute 0.1 0.0 - 0.7 K/uL   Basophils Relative 0 %   Basophils Absolute 0.0 0.0 - 0.1 K/uL  Basic metabolic panel     Status: Abnormal   Collection Time: 02/11/16  5:30 AM  Result Value Ref Range   Sodium 140 135 - 145 mmol/L   Potassium 3.9 3.5 - 5.1 mmol/L   Chloride 105 101 - 111 mmol/L   CO2 28 22 - 32 mmol/L   Glucose, Bld 131 (H) 65 - 99 mg/dL   BUN 22 (H) 6 - 20 mg/dL   Creatinine,  Ser 1.14 0.61 - 1.24 mg/dL   Calcium 9.3 8.9 - 10.3 mg/dL   GFR calc non Af Amer >60 >60 mL/min   GFR calc Af Amer >60 >60 mL/min   Anion gap 7 5 - 15    Imaging Studies: Dg Abd Acute W/chest  Result Date: 02/11/2016 CLINICAL DATA:  Mid abdominal pain and swelling for 4 hours. EXAM: DG ABDOMEN ACUTE W/ 1V CHEST  COMPARISON:  10/04/2015 FINDINGS: There is abnormal dilatation of mid abdominal small bowel, particularly to the left of midline. There are air-fluid levels. Stool and air is present throughout the colon. No extraluminal air is evident. No biliary or urinary calculi are evident. IMPRESSION: Abnormal small bowel dilatation with air-fluid levels, suspicious for obstruction. Electronically Signed   By: Andreas Newport M.D.   On: 02/11/2016 06:33    Procedures (including critical care time)   Final Clinical Impressions(s) / ED Diagnoses   Final diagnoses:  SBO (small bowel obstruction) ([place])      Shanon Rosser, MD 02/11/16 0700

## 2016-02-11 NOTE — Progress Notes (Signed)
Pt coming from [facility] for admission to [facility]  Pt found to have SBO. H/o the same x2 after partial cholectomy several years ago. Pt underwent colonoscopy by [facility] on 8/29. No BM since colonoscopy w/ increasing Abd pain and nausea. No emesis. EDP deferred NG tube at this time as he states pt has been able to clear these without an NG tube in the past.   Linna Darner, MD [facility] 02/11/2016, 7:52 AM

## 2016-02-12 ENCOUNTER — Inpatient Hospital Stay (HOSPITAL_COMMUNITY): Payer: Medicare Other

## 2016-02-12 LAB — CBC
HCT: 39.5 % (ref 39.0–52.0)
Hemoglobin: 13.4 g/dL (ref 13.0–17.0)
MCH: 31.1 pg (ref 26.0–34.0)
MCHC: 33.9 g/dL (ref 30.0–36.0)
MCV: 91.6 fL (ref 78.0–100.0)
PLATELETS: 192 10*3/uL (ref 150–400)
RBC: 4.31 MIL/uL (ref 4.22–5.81)
RDW: 12.5 % (ref 11.5–15.5)
WBC: 6 10*3/uL (ref 4.0–10.5)

## 2016-02-12 LAB — BASIC METABOLIC PANEL
ANION GAP: 7 (ref 5–15)
BUN: 13 mg/dL (ref 6–20)
CALCIUM: 8.8 mg/dL — AB (ref 8.9–10.3)
CO2: 27 mmol/L (ref 22–32)
Chloride: 106 mmol/L (ref 101–111)
Creatinine, Ser: 1.07 mg/dL (ref 0.61–1.24)
GFR calc Af Amer: >60 mL/min (ref >60)
GLUCOSE: 132 mg/dL — AB (ref 65–99)
Potassium: 3.3 mmol/L — ABNORMAL LOW (ref 3.5–5.1)
SODIUM: 140 mmol/L (ref 135–145)

## 2016-02-12 LAB — GLUCOSE, CAPILLARY
GLUCOSE-CAPILLARY: 87 mg/dL (ref 65–99)
Glucose-Capillary: 140 mg/dL — ABNORMAL HIGH (ref 65–99)
Glucose-Capillary: 90 mg/dL (ref 65–99)

## 2016-02-12 NOTE — Progress Notes (Signed)
PROGRESS NOTE    George [name]  [id number] DOB: 1941/05/30 DOA: 02/11/2016 PCP: [name], MD      Brief Narrative:  George [name] is a 75 y.o. 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 Abdominal pain started 2 days ago after routine colonoscopy. Pain initially intermittent but now constant with waxing and waning nature. Associated with abdominal distention. Diffuse throughout abdomen. Associate with nausea and no bowel movement for the last 2 days. He was admitted for SBO.    Assessment & Plan:   Principal Problem:   SBO (small bowel obstruction) (HCC) Active Problems:   BPH (benign prostatic hyperplasia)   Depression   H/O hemicolectomy   Hyperglycemia   Essential hypertension   SBO, recurrent since partial colectomy   S/p colonoscopy by [facility] 8/29  KUB this morning with persistent but improved loops of SBO   IVF  Advance to CLD today   Essential HTN  Not on antihypertensives outpatient  Hydralazine prn SBP > 180  BPH  Continue proscar, doxazosin  Depression  Continue wellbutrin, celexa  Hyperglycemia on admission  Check Ha1c    DVT prophylaxis: heparin subq  Code Status: full Family Communication: daughter Ashok Norris in room and updated during exam Disposition Plan: Hopefully next 24-48 hours pending his tolerance to diet and improvement in overall symptoms    Consultants:   None   Procedures:   None  Antimicrobials:   None    Subjective: Patient states that he has had 3 total episodes of SBO since his right hemicolectomy in 2005. He had a routine follow up colonoscopy yesterday which went well. He had a meal afterward. Then 2 days ago, he started getting abdominal pain, abdominal distension, similar to his previous SBO episodes. After KUB this morning, he states that he had 2 large loose BM  and has been passing gas. His abdominal distension has improved as well as his abdominal pain. No nausea, vomiting, fevers, chills, Cp, SOB.   Objective: Vitals:   02/11/16 0917 02/11/16 1406 02/11/16 2121 02/12/16 0530  BP: 108/60 117/65 124/67 111/63  Pulse: 77 77 87 93  Resp: 17 16 18 18   Temp: 97.5 F (36.4 C) 98 F (36.7 C) 99.4 F (37.4 C) 98.9 F (37.2 C)  TempSrc: Oral Oral Oral Oral  SpO2: 97% 98% 98% 95%  Weight: 81.5 kg (179 lb 11.2 oz)     Height: 5\' 7"  (1.702 m)       Intake/Output Summary (Last 24 hours) at 02/12/16 1128 Last data filed at 02/12/16 1013  Gross per 24 hour  Intake              365 ml  Output                0 ml  Net              365 ml   Filed Weights   02/11/16 0507 02/11/16 0917  Weight: 78.9 kg (174 lb) 81.5 kg (179 lb 11.2 oz)    Examination:  General exam: Appears calm and comfortable  Respiratory system: Clear to auscultation. Respiratory effort normal. Cardiovascular system: S1 & S2 heard, RRR. No JVD, murmurs, rubs, gallops or clicks. No pedal edema. Gastrointestinal system: Abdomen is distended, but soft and +TTP LUQ . No organomegaly or masses felt. Normal bowel sounds heard. Central nervous system: Alert and oriented. No focal  neurological deficits. Extremities: Symmetric 5 x 5 power. Skin: No rashes, lesions or ulcers Psychiatry: Judgement and insight appear normal. Mood & affect appropriate.   Data Reviewed: I have personally reviewed following labs and imaging studies  CBC:  Recent Labs Lab 02/11/16 0530 02/12/16 0630  WBC 10.9* 6.0  NEUTROABS 8.8*  --   HGB 15.7 13.4  HCT 44.2 39.5  MCV 89.7 91.6  PLT 212 [id number]   Basic Metabolic Panel:  Recent Labs Lab 02/11/16 0530 02/12/16 0630  NA 140 140  K 3.9 3.3*  CL 105 106  CO2 28 27  GLUCOSE 131* 132*  BUN 22* 13  CREATININE 1.14 1.07  CALCIUM 9.3 8.8*   GFR: Estimated Creatinine Clearance: 63.8 mL/min (by C-G formula based on SCr of 1.07 mg/dL). Liver  Function Tests: No results for input(s): AST, ALT, ALKPHOS, BILITOT, PROT, ALBUMIN in the last 168 hours. No results for input(s): LIPASE, AMYLASE in the last 168 hours. No results for input(s): AMMONIA in the last 168 hours. Coagulation Profile:  Recent Labs Lab 02/11/16 1046  INR 1.02   Cardiac Enzymes: No results for input(s): CKTOTAL, CKMB, CKMBINDEX, TROPONINI in the last 168 hours. BNP (last 3 results) No results for input(s): PROBNP in the last 8760 hours. HbA1C: No results for input(s): HGBA1C in the last 72 hours. CBG:  Recent Labs Lab 02/11/16 1629 02/12/16 0759  GLUCAP 105* 140*   Lipid Profile: No results for input(s): CHOL, HDL, LDLCALC, TRIG, CHOLHDL, LDLDIRECT in the last 72 hours. Thyroid Function Tests: No results for input(s): TSH, T4TOTAL, FREET4, T3FREE, THYROIDAB in the last 72 hours. Anemia Panel: No results for input(s): VITAMINB12, FOLATE, FERRITIN, TIBC, IRON, RETICCTPCT in the last 72 hours. Sepsis Labs: No results for input(s): PROCALCITON, LATICACIDVEN in the last 168 hours.  No results found for this or any previous visit (from the past 240 hour(s)).       Radiology Studies: Dg Abd 1 View  Result Date: 02/12/2016 CLINICAL DATA:  Nausea. EXAM: ABDOMEN - 1 VIEW COMPARISON:  02/11/2016. FINDINGS: Persistent but slightly improved distended loops of small bowel are noted. Stool in the colon. Pelvic calcifications consistent phleboliths. Degenerative changes lumbar spine and both hips. IMPRESSION: Persistent but slightly improved distended loops of small bowel are noted. Again small-bowel obstruction cannot be excluded. Stool is noted in the colon. Electronically Signed   By: Marcello Moores  Register   On: 02/12/2016 07:17   Dg Abd Acute W/chest  Result Date: 02/11/2016 CLINICAL DATA:  Mid abdominal pain and swelling for 4 hours. EXAM: DG ABDOMEN ACUTE W/ 1V CHEST COMPARISON:  10/04/2015 FINDINGS: There is abnormal dilatation of mid abdominal small bowel,  particularly to the left of midline. There are air-fluid levels. Stool and air is present throughout the colon. No extraluminal air is evident. No biliary or urinary calculi are evident. IMPRESSION: Abnormal small bowel dilatation with air-fluid levels, suspicious for obstruction. Electronically Signed   By: Andreas Newport M.D.   On: 02/11/2016 06:33        Scheduled Meds: . aspirin  81 mg Oral Daily  . buPROPion  450 mg Oral q morning - 10a  . celecoxib  200 mg Oral Daily  . citalopram  20 mg Oral QODAY  . doxazosin  8 mg Oral Daily  . finasteride  5 mg Oral q morning - 10a  . heparin  5,000 Units Subcutaneous Q8H  . Testosterone  1 application Topical Once per day on Mon Wed Fri   Continuous Infusions:  LOS: 1 day    Time spent: 30 minutes    Shon Millet, DO [facility] Pager [phone]  If 7PM-7AM, please contact night-coverage [email or web address] Password [email or web address] 02/12/2016, 11:28 AM

## 2016-02-13 LAB — CBC WITH DIFFERENTIAL/PLATELET
BASOS ABS: 0 10*3/uL (ref 0.0–0.1)
BASOS PCT: 0 %
EOS ABS: 0.1 10*3/uL (ref 0.0–0.7)
Eosinophils Relative: 3 %
HEMATOCRIT: 37.6 % — AB (ref 39.0–52.0)
HEMOGLOBIN: 12.7 g/dL — AB (ref 13.0–17.0)
Lymphocytes Relative: 25 %
Lymphs Abs: 1.1 10*3/uL (ref 0.7–4.0)
MCH: 30.9 pg (ref 26.0–34.0)
MCHC: 33.8 g/dL (ref 30.0–36.0)
MCV: 91.5 fL (ref 78.0–100.0)
Monocytes Absolute: 0.8 10*3/uL (ref 0.1–1.0)
Monocytes Relative: 18 %
NEUTROS ABS: 2.4 10*3/uL (ref 1.7–7.7)
NEUTROS PCT: 54 %
Platelets: 184 10*3/uL (ref 150–400)
RBC: 4.11 MIL/uL — AB (ref 4.22–5.81)
RDW: 12.7 % (ref 11.5–15.5)
WBC: 4.5 10*3/uL (ref 4.0–10.5)

## 2016-02-13 LAB — BASIC METABOLIC PANEL
ANION GAP: 8 (ref 5–15)
BUN: 13 mg/dL (ref 6–20)
CHLORIDE: 107 mmol/L (ref 101–111)
CO2: 26 mmol/L (ref 22–32)
CREATININE: 1.12 mg/dL (ref 0.61–1.24)
Calcium: 8.5 mg/dL — ABNORMAL LOW (ref 8.9–10.3)
GFR calc non Af Amer: >60 mL/min (ref >60)
Glucose, Bld: 101 mg/dL — ABNORMAL HIGH (ref 65–99)
Potassium: 3.2 mmol/L — ABNORMAL LOW (ref 3.5–5.1)
SODIUM: 141 mmol/L (ref 135–145)

## 2016-02-13 LAB — HEMOGLOBIN A1C
HEMOGLOBIN A1C: 5.2 % (ref 4.8–5.6)
MEAN PLASMA GLUCOSE: 103 mg/dL

## 2016-02-13 LAB — GLUCOSE, CAPILLARY: GLUCOSE-CAPILLARY: 99 mg/dL (ref 65–99)

## 2016-02-13 MED ORDER — POTASSIUM CHLORIDE CRYS ER 20 MEQ PO TBCR
40.0000 meq | EXTENDED_RELEASE_TABLET | Freq: Once | ORAL | Status: AC
Start: 2016-02-13 — End: 2016-02-13
  Administered 2016-02-13: 40 meq via ORAL
  Filled 2016-02-13: qty 2

## 2016-02-13 MED ORDER — POTASSIUM CHLORIDE CRYS ER 20 MEQ PO TBCR
40.0000 meq | EXTENDED_RELEASE_TABLET | Freq: Once | ORAL | Status: AC
  Administered 2016-02-13: 40 meq via ORAL
  Filled 2016-02-13: qty 2

## 2016-02-13 NOTE — Discharge Summary (Signed)
Physician Discharge Summary  George [name] [id number] DOB: 05/25/1941 DOA: 02/11/2016  PCP: [name], MD  Admit date: 02/11/2016 Discharge date: 02/13/2016  Admitted From: Home  Disposition: Home  Recommendations for Outpatient Follow-up:  1. 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 Abdominal pain started 2 days ago after routine colonoscopy. Pain initially intermittent but now constant with waxing and waning nature. Associated with abdominal distention. Diffuse throughout abdomen. Associate with nausea and no bowel movement for the last 2 days. He was admitted for SBO. He was treated with bowel rest and pain management. He did not require NGT. Repeat KUB showed persistent but improved loops of SBO. His symptoms continued to improve and diet was slowly advanced. On morning of discharge, he had a loose watery Bm, tolerated scrambled eggs and toast without abdominal pain or nausea/vomiting. He was instructed to advance his diet slowly over the weekend at home and to follow up with PCP next week.   Discharge Diagnoses:  Principal Problem:   SBO (small bowel obstruction) (HCC) Active Problems:   BPH (benign prostatic hyperplasia)   Depression   H/O hemicolectomy   Hyperglycemia   Essential hypertension    Discharge Instructions  Discharge Instructions    Call MD for:  persistant nausea and vomiting    Complete by:  As directed   Call MD for:  severe uncontrolled pain    Complete by:  As directed   Call MD for:  temperature >100.4    Complete by:  As directed   Diet - low sodium heart healthy     Complete by:  As directed   Increase activity slowly    Complete by:  As directed       Medication List    TAKE these medications   ANDROGEL PUMP 20.25 MG/ACT (1.62%) Gel Generic drug:  Testosterone Apply 1 application topically 3 (three) times a week.   aspirin 81 MG chewable tablet Chew 81 mg by mouth daily.   buPROPion 150 MG 24 hr tablet Commonly known as:  WELLBUTRIN XL Take 450 mg by mouth every morning.   celecoxib 200 MG capsule Commonly known as:  CELEBREX Take 200 mg by mouth daily.   citalopram 20 MG tablet Commonly known as:  CELEXA Take 20 mg by mouth every other day.   cyclobenzaprine 5 MG tablet Commonly known as:  FLEXERIL Take 1 tablet (5 mg total) by mouth 3 (three) times daily as needed for muscle spasms.   doxazosin 8 MG tablet Commonly known as:  CARDURA Take 8 mg by mouth daily.   finasteride 5 MG tablet Commonly known as:  PROSCAR Take 5 mg by mouth every morning.   traMADol 50 MG tablet Commonly known as:  ULTRAM Take 1 tablet (50 mg total) by mouth every 6 (six) hours as needed for moderate pain or severe pain.      Follow-up Information    [name], MD. Schedule an appointment as soon as possible for a visit in 1 week(s).   Specialty:  Family Medicine Contact information: [place] [address] [phone]          No Known Allergies  Consultations:  None  Procedures/Studies: Dg Abd 1 View  Result Date: 02/12/2016 CLINICAL DATA:  Nausea. EXAM: ABDOMEN - 1 VIEW COMPARISON:  02/11/2016. FINDINGS: Persistent but slightly improved distended loops of small bowel are noted. Stool in the colon. Pelvic calcifications consistent phleboliths. Degenerative changes lumbar spine and both hips. IMPRESSION: Persistent but slightly improved distended loops of small bowel are noted. Again small-bowel obstruction cannot be excluded. Stool is noted in the colon. Electronically Signed   By: Marcello Moores  Register    On: 02/12/2016 07:17   Dg Abd Acute W/chest  Result Date: 02/11/2016 CLINICAL DATA:  Mid abdominal pain and swelling for 4 hours. EXAM: DG ABDOMEN ACUTE W/ 1V CHEST COMPARISON:  10/04/2015 FINDINGS: There is abnormal dilatation of mid abdominal small bowel, particularly to the left of midline. There are air-fluid levels. Stool and air is present throughout the colon. No extraluminal air is evident. No biliary or urinary calculi are evident. IMPRESSION: Abnormal small bowel dilatation with air-fluid levels, suspicious for obstruction. Electronically Signed   By: Andreas Newport M.D.   On: 02/11/2016 06:33   Subjective:   Discharge Exam: Vitals:   02/12/16 2124 02/13/16 0520  BP: (!) 110/54 112/60  Pulse: 86 79  Resp: 19 18  Temp: 98.2 F (36.8 C) 98.3 F (36.8 C)   Vitals:   02/12/16 0530 02/12/16 1541 02/12/16 2124 02/13/16 0520  BP: 111/63 (!) 103/59 (!) 110/54 112/60  Pulse: 93 73 86 79  Resp: 18 19 19 18   Temp: 98.9 F (37.2 C)  98.2 F (36.8 C) 98.3 F (36.8 C)  TempSrc: Oral     SpO2: 95% 96% 98% 97%  Weight:      Height:        General: George [name] is alert, awake, not in acute distress Cardiovascular: RRR, S1/S2 +, no rubs, no gallops Respiratory: CTA bilaterally, no wheezing, no rhonchi Abdominal: Soft, mildly distended, bowel sounds + Extremities: no edema, no cyanosis    The results of significant diagnostics from this hospitalization (including imaging, microbiology, ancillary and laboratory) are listed below for reference.     Microbiology: No results found for this or any previous visit (from the past 240 hour(s)).   Labs: BNP (last 3 results) No results for input(s): BNP in the last 8760 hours. Basic Metabolic Panel:  Recent Labs Lab 02/11/16 0530 02/12/16 0630 02/13/16 0433  NA 140 140 141  K 3.9 3.3* 3.2*  CL 105 106 107  CO2 28 27 26   GLUCOSE 131* 132* 101*  BUN 22* 13 13  CREATININE 1.14 1.07 1.12  CALCIUM 9.3 8.8* 8.5*   Liver Function  Tests: No results for input(s): AST, ALT, ALKPHOS, BILITOT, PROT, ALBUMIN in the last 168 hours. No results for input(s): LIPASE, AMYLASE in the last 168 hours. No results for input(s): AMMONIA in the last 168 hours. CBC:  Recent Labs Lab 02/11/16 0530 02/12/16 0630 02/13/16 0433  WBC 10.9* 6.0 4.5  NEUTROABS 8.8*  --  2.4  HGB 15.7 13.4 12.7*  HCT 44.2 39.5 37.6*  MCV 89.7 91.6 91.5  PLT 212 192 184   Cardiac Enzymes: No results for input(s): CKTOTAL, CKMB, CKMBINDEX, TROPONINI in the last 168 hours. BNP: Invalid input(s): POCBNP CBG:  Recent Labs Lab 02/11/16 1629 02/12/16 0759 02/12/16 1738 02/12/16 2123 02/13/16 0741  GLUCAP 105* 140* 90 87 99   D-Dimer No results for input(s): DDIMER in the last 72 hours. Hgb A1c  Recent Labs  02/12/16 0630  HGBA1C 5.2   Lipid Profile No  results for input(s): CHOL, HDL, LDLCALC, TRIG, CHOLHDL, LDLDIRECT in the last 72 hours. Thyroid function studies No results for input(s): TSH, T4TOTAL, T3FREE, THYROIDAB in the last 72 hours.  Invalid input(s): FREET3 Anemia work up No results for input(s): VITAMINB12, FOLATE, FERRITIN, TIBC, IRON, RETICCTPCT in the last 72 hours. Urinalysis    Component Value Date/Time   COLORURINE YELLOW 10/04/2015 1515   APPEARANCEUR CLEAR 10/04/2015 1515   LABSPEC 1.021 10/04/2015 1515   PHURINE 6.0 10/04/2015 1515   GLUCOSEU NEGATIVE 10/04/2015 1515   HGBUR NEGATIVE 10/04/2015 1515   BILIRUBINUR NEGATIVE 10/04/2015 1515   KETONESUR NEGATIVE 10/04/2015 1515   PROTEINUR NEGATIVE 10/04/2015 1515   UROBILINOGEN 0.2 02/26/2014 0920   NITRITE NEGATIVE 10/04/2015 1515   LEUKOCYTESUR NEGATIVE 10/04/2015 1515   Sepsis Labs Invalid input(s): PROCALCITONIN,  WBC,  LACTICIDVEN Microbiology No results found for this or any previous visit (from the past 240 hour(s)).   Time coordinating discharge: Over 30 minutes  SIGNED:   Shon Millet, DO [facility] Pager  [phone]  If 7PM-7AM, please contact night-coverage [email or web address] Password [facility] 02/13/2016, 12:29 PM

## 2016-02-13 NOTE — Discharge Instructions (Signed)
Small Bowel Obstruction °A small bowel obstruction is a blockage in the small bowel. The small bowel, which is also called the small intestine, is a long, slender tube that connects the stomach to the colon. When a person eats and drinks, food and fluids go from the stomach to the small bowel. This is where most of the nutrients in the food and fluids are absorbed. °A small bowel obstruction will prevent food and fluids from passing through the small bowel as they normally do during digestion. The small bowel can become partially or completely blocked. This can cause symptoms such as abdominal pain, vomiting, and bloating. If this condition is not treated, it can be dangerous because the small bowel could rupture. °CAUSES °Common causes of this condition include: °· Scar tissue from previous surgery or radiation treatment. °· Recent surgery. This may cause the movements of the bowel to slow down and cause food to block the intestine. °· Hernias. °· Inflammatory bowel disease (colitis). °· Twisting of the bowel (volvulus). °· Tumors. °· A foreign body. °· Slipping of a part of the bowel into another part (intussusception). °SYMPTOMS °Symptoms of this condition include: °· Abdominal pain. This may be dull cramps or sharp pain. It may occur in one area, or it may be present in the entire abdomen. Pain can range from mild to severe, depending on the degree of obstruction. °· Nausea and vomiting. Vomit may be greenish or a yellow bile color. °· Abdominal bloating. °· Constipation. °· Lack of passing gas. °· Frequent belching. °· Diarrhea. This may occur if the obstruction is partial and runny stool is able to leak around the obstruction. °DIAGNOSIS °This condition may be diagnosed based on a physical exam, medical history, and X-rays of the abdomen. You may also have other tests, such as a CT scan of the abdomen and pelvis. °TREATMENT °Treatment for this condition depends on the cause and severity of the problem.  Treatment options may include: °· Bed rest along with fluids and pain medicines that are given through an IV tube inserted into one of your veins. Sometimes, this is all that is needed for the obstruction to improve. °· Following a simple diet. In some cases, a clear liquid diet may be required for several days. This allows the bowel to rest. °· Placement of a small tube (nasogastric tube) into the stomach. When the bowel is blocked, it usually swells up like a balloon that is filled with air and fluids. The air and fluids may be removed by suction through the nasogastric tube. This can help with pain, discomfort, and nausea. It can also help the obstruction to clear up faster. °· Surgery. This may be required if other treatments do not work. Bowel obstruction from a hernia may require early surgery and can be an emergency procedure. Surgery may also be required for scar tissue that causes frequent or severe obstructions. °HOME CARE INSTRUCTIONS °· Get plenty of rest. °· Follow instructions from your health care provider about eating restrictions. You may need to avoid solid foods and consume only clear liquids until your condition improves. °· Take over-the-counter and prescription medicines only as told by your health care provider. °· Keep all follow-up visits as told by your health care provider. This is important. °SEEK MEDICAL CARE IF: °· You have a fever. °· You have chills. °SEEK IMMEDIATE MEDICAL CARE IF: °· You have increased pain or cramping. °· You vomit blood. °· You have uncontrolled vomiting or nausea. °· You cannot drink   fluids because of vomiting or pain. °· You develop confusion. °· You begin feeling very dry or thirsty (dehydrated). °· You have severe bloating. °· You feel extremely weak or you faint. °  °This information is not intended to replace advice given to you by your health care provider. Make sure you discuss any questions you have with your health care provider. °  °Document Released:  08/16/2005 Document Revised: 02/18/2015 Document Reviewed: 07/24/2014 °Elsevier Interactive Patient Education ©2016 [employer] ° ° °

## 2016-02-13 NOTE — Progress Notes (Signed)
Pt given discharge instructions, prescriptions, and care notes. Pt verbalized understanding AEB no further questions or concerns at this time. IV was discontinued, no redness, pain, or swelling noted at this time. Pt left the floor via wheelchair with staff in stable condition. 

## 2016-02-18 DIAGNOSIS — R972 Elevated prostate specific antigen [PSA]: Secondary | ICD-10-CM | POA: Diagnosis not present

## 2016-02-18 DIAGNOSIS — E291 Testicular hypofunction: Secondary | ICD-10-CM | POA: Diagnosis not present

## 2016-02-24 DIAGNOSIS — N401 Enlarged prostate with lower urinary tract symptoms: Secondary | ICD-10-CM | POA: Diagnosis not present

## 2016-02-24 DIAGNOSIS — R972 Elevated prostate specific antigen [PSA]: Secondary | ICD-10-CM | POA: Diagnosis not present

## 2016-02-24 DIAGNOSIS — E291 Testicular hypofunction: Secondary | ICD-10-CM | POA: Diagnosis not present

## 2016-02-25 DIAGNOSIS — T671XXD Heat syncope, subsequent encounter: Secondary | ICD-10-CM | POA: Diagnosis not present

## 2016-02-25 DIAGNOSIS — K5669 Other intestinal obstruction: Secondary | ICD-10-CM | POA: Diagnosis not present

## 2016-02-25 DIAGNOSIS — Z23 Encounter for immunization: Secondary | ICD-10-CM | POA: Diagnosis not present

## 2016-03-10 ENCOUNTER — Encounter: Payer: Self-pay | Admitting: Cardiology

## 2016-03-14 DIAGNOSIS — C44319 Basal cell carcinoma of skin of other parts of face: Secondary | ICD-10-CM | POA: Diagnosis not present

## 2016-03-17 DIAGNOSIS — R55 Syncope and collapse: Secondary | ICD-10-CM | POA: Diagnosis not present

## 2016-03-18 ENCOUNTER — Encounter: Payer: Self-pay | Admitting: Cardiology

## 2016-03-18 NOTE — Progress Notes (Signed)
George [name]  Date of visit:  03/17/2016 DOB:  [birth year]    Age:  75 yrs. Medical record number:  [id number]     Account number:  [id number] Primary Care Provider: Ernestine Conrad ____________________________ CURRENT DIAGNOSES  1. Syncope and collapse ____________________________ ALLERGIES  Doxycycline, Stomach ache ____________________________ MEDICATIONS  1. aspirin 81 mg tablet,delayed release  2. AndroGel 1 % (25 mg/2.5 gram) transdermal gel packet, Take as directed  3. finasteride 5 mg tablet, 1 p.o. daily  4. citalopram 20 mg tablet, 1 p.o. daily  5. doxazosin 4 mg tablet, 1 p.o. daily  6. propranolol 20 mg tablet, 1 p.o. daily  7. Celebrex 200 mg capsule, 1 p.o. daily  8. bupropion HCl XL 150 mg 24 hr tablet, extended release, 1 p.o. daily  9. multivitamin tablet, 1 p.o. daily ____________________________ HISTORY OF PRESENT ILLNESS This very nice 75 year old male is seen for evaluation of recurrent syncope. 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 In April he had 2 episodes of syncope. One occurred when he was sitting at the table when he woke up and passed out in his food was sitting at the time. He denied any nausea or other symptoms prior to that. At the time he noted a sensation that his mouth felt somewhat full. He had another episode where he was able to lower himself to the floor before he passed out. He went to the emergency room complaining of a headache and was admitted over night. He was found to be mildly orthostatic and carotid Dopplers were negative. Echocardiogram was unremarkable. He had had some diarrhea prior to that episode. He was discharged home on a half dose of his doxazosin. He had another episode of syncope in August. He was standing at the time and had been walking around and again felt a full feeling in his mouth and passed out. He did not injure himself at that time. He was admitted shortly thereafter with  a small bowel obstruction that resolved. Because of a history of recurrent syncope I was asked to see him. He denies angina and has no PND, orthopnea or claudication. He is normally able to be involved in normal activity playing golf without any cardiac symptoms. He does not really have any orthostatic type symptoms. He has been on low dose propanolol for a number of years for essential tremor.  ____________________________ PAST HISTORY  Past Medical Illnesses:  arthritis, hyperlipidemia, osteoarthritis, depression, tinnitus, BPH, colon polyp, high grade dysplasia,hypogonadism, Peyronies's disease, TMJ, essential temor, history of colon cancer treated with surgery;  Cardiovascular Illnesses:  no previous history of cardiac disease;  Surgical Procedures:  knee replacement-rt, colectomy-partial;  NYHA Classification:  I;  Cardiology Procedures-Invasive:  no previous interventional or invasive cardiology procedures;  Cardiology Procedures-Noninvasive:  echocardiogram April 2017;  LVEF of 55% documented via echocardiogram on 10/05/2015,   ____________________________ CARDIO-PULMONARY TEST DATES EKG Date:  03/17/2016;  Echocardiography Date: 10/05/2015;   ____________________________ FAMILY HISTORY Father -- Father dead, Neoplasm of the pancreas Mother -- Mother dead, Diabetes mellitus ____________________________ SOCIAL HISTORY Alcohol Use:  4 times a week;  Smoking:  used to smoke but quit, 1984;  Lifestyle:  widower and 2 daughters;  Occupation:  retired and Hotel manager for [employer];  Residence:  lives alone;  Spouse's Occupation:  deceased and 2013-cancer ____________________________ Tira:  denies recent weight change, fatique or change in exercise tolerance.  Integumentary:basal cell Carcinoma Eyes: wears eye glasses/contact lenses, denies diplopia, glaucoma or visual  field defects. Ears, Nose, Throat, Mouth:  denies any hearing loss, epistaxis, hoarseness or difficulty  speaking. Respiratory: denies dyspnea, cough, wheezing or hemoptysis. Cardiovascular:  please review HPI Abdominal: denies dyspepsia, GI bleeding, constipation, or diarrhea Genitourinary-Male: nocturia, BPH  Musculoskeletal:  arthritis of the knees Neurological:   essential tremor treated with propanolol Psychiatric:  remote history of depression Hematological/Immunologic:  denies any food allergies, bleeding disorders. ____________________________ PHYSICAL EXAMINATION VITAL SIGNS  Blood Pressure:  120/70 Sitting, Left arm, regular cuff  , 120/80 Standing, Left arm and regular cuff   Pulse:  84/min. Weight:  174.00 lbs. Height:  68"BMI: 26  Constitutional:  pleasant white male in no acute distress Skin:  scar of right temple from recent surgery Head:  normocephalic, normal hair pattern, no masses or tenderness Eyes:  EOMS Intact, PERRLA, C and S clear, Funduscopic exam not done. ENT:  ears, nose and throat reveal no gross abnormalities.  Dentition good. Neck:  supple, without massess. No JVD, thyromegaly or carotid bruits. Carotid upstroke normal. Chest:  normal symmetry, clear to auscultation. Cardiac:  regular rhythm, normal S1 and S2, No S3 or S4, no murmurs, gallops or rubs detected. Abdomen:  abdomen soft,non-tender, no masses, no hepatospenomegaly, or aneurysm noted Peripheral Pulses:  the femoral,dorsalis pedis, and posterior tibial pulses are full and equal bilaterally with no bruits auscultated. Extremities & Back:  scar from knee surgery bilaterally, no edema present Neurological:  no gross motor or sensory deficits noted, affect appropriate, oriented x3. ____________________________ IMPRESSIONS/PLAN  1. Recurrent episodes of syncope of uncertain etiology. The abrupt nature of the arrhythmia including 2 sitting down make me think this is more likely to be an arryhthmic episode rather than due to blood pressure. He was mildly orthostatic however on examination today. 2. History of  BPH 3. History of recurrent small bowel obstruction  Recommendations:  Initial 12-lead EKG was normal. One that I did showed 3 beats of intermittent left anterior hemiblock. With the infiltrate duration of the episodes I do not think that event monitoring for 30 days would be helpful. The options would be continued watchful waiting versus implantation of a implantable loop monitor. I would favor this particularly since he has now had 3 episodes and had evidence of some mild conduction system bleed intermittently on his EKG. I will speak to my colleagues and see if we can arrange this. His workup otherwise is unremarkable and his exam was normal today. Thank you for asking me to see him today.  ____________________________ TODAYS ORDERS  1. 12 Lead EKG: Today  2. Implantable loop monitor                       ____________________________ Cardiology Physician:  Kerry Hough MD [facility]

## 2016-03-21 ENCOUNTER — Telehealth: Payer: Self-pay | Admitting: Nurse Practitioner

## 2016-03-21 NOTE — Telephone Encounter (Signed)
Received message from Dr Wynonia Lawman asking to arrange ILR implant for patient for evaluation of syncope.  Offered implant this afternoon, he would like to wait until the week of the 23rd.  ILR implant scheduled for 04-05-16 at 7AM with Dr Rayann Heman. Pt to arrive at 6:30AM. He is aware no need to hold medications and ok to eat breakfast that morning.    Chanetta Marshall, NP 03/21/2016 8:38 AM

## 2016-04-05 ENCOUNTER — Encounter (HOSPITAL_COMMUNITY): Payer: Self-pay | Admitting: Internal Medicine

## 2016-04-05 ENCOUNTER — Encounter (HOSPITAL_COMMUNITY)
Admission: RE | Disposition: A | Discharge status: Home / Self Care | Payer: Self-pay | Source: Ambulatory Visit | Attending: Internal Medicine

## 2016-04-05 ENCOUNTER — Telehealth: Payer: Self-pay | Admitting: Internal Medicine

## 2016-04-05 ENCOUNTER — Ambulatory Visit (HOSPITAL_COMMUNITY)
Admission: RE | Admit: 2016-04-05 | Discharge: 2016-04-05 | Disposition: A | Discharge status: Home / Self Care | Payer: Medicare Other | Source: Ambulatory Visit | Attending: Internal Medicine | Admitting: Internal Medicine

## 2016-04-05 DIAGNOSIS — Z85038 Personal history of other malignant neoplasm of large intestine: Secondary | ICD-10-CM | POA: Diagnosis not present

## 2016-04-05 DIAGNOSIS — Z87891 Personal history of nicotine dependence: Secondary | ICD-10-CM | POA: Diagnosis not present

## 2016-04-05 DIAGNOSIS — Z833 Family history of diabetes mellitus: Secondary | ICD-10-CM | POA: Diagnosis not present

## 2016-04-05 DIAGNOSIS — N400 Enlarged prostate without lower urinary tract symptoms: Secondary | ICD-10-CM | POA: Diagnosis not present

## 2016-04-05 DIAGNOSIS — R55 Syncope and collapse: Secondary | ICD-10-CM | POA: Insufficient documentation

## 2016-04-05 DIAGNOSIS — Z96651 Presence of right artificial knee joint: Secondary | ICD-10-CM | POA: Diagnosis not present

## 2016-04-05 DIAGNOSIS — Z8719 Personal history of other diseases of the digestive system: Secondary | ICD-10-CM | POA: Insufficient documentation

## 2016-04-05 DIAGNOSIS — Z85828 Personal history of other malignant neoplasm of skin: Secondary | ICD-10-CM | POA: Insufficient documentation

## 2016-04-05 DIAGNOSIS — Z7982 Long term (current) use of aspirin: Secondary | ICD-10-CM | POA: Diagnosis not present

## 2016-04-05 DIAGNOSIS — M1990 Unspecified osteoarthritis, unspecified site: Secondary | ICD-10-CM | POA: Diagnosis not present

## 2016-04-05 HISTORY — PX: EP IMPLANTABLE DEVICE: SHX172B

## 2016-04-05 SURGERY — LOOP RECORDER INSERTION
Anesthesia: LOCAL

## 2016-04-05 MED ORDER — LIDOCAINE-EPINEPHRINE 1 %-1:100000 IJ SOLN
INTRAMUSCULAR | Status: AC
  Filled 2016-04-05: qty 1

## 2016-04-05 MED ORDER — LIDOCAINE-EPINEPHRINE 1 %-1:100000 IJ SOLN
INTRAMUSCULAR | Status: DC | PRN
  Administered 2016-04-05: 10 mL via INTRADERMAL

## 2016-04-05 SURGICAL SUPPLY — 2 items
LOOP REVEAL LINQSYS (Prosthesis & Implant Heart) ×1 IMPLANT
PACK LOOP INSERTION (CUSTOM PROCEDURE TRAY) ×2 IMPLANT

## 2016-04-05 NOTE — Telephone Encounter (Signed)
Pt called reporting that post ILR insertion his dressing was saturated with blood and oozing from the dressing. I instructed him to sit and hold pressure at the insertion site for 15-54min. I gave him the device clinic number and patient will call for further instructions

## 2016-04-05 NOTE — Telephone Encounter (Signed)
Called patient who reported that after holding pressure at site for 15-55min the bleeding had stopped. I confirmed wound check appt for 11/8 and encouraged patient to call back with any other concerns. He verbalized understanding.

## 2016-04-05 NOTE — Interval H&P Note (Signed)
History and Physical Interval Note:  04/05/2016 7:36 AM  George [name]  has presented today for surgery, with the diagnosis of syncope  The various methods of treatment have been discussed with the patient and family. After consideration of risks, benefits and other options for treatment, the patient has consented to  Procedure(s): Loop Recorder Insertion (N/A) as a surgical intervention .  The patient's history has been reviewed, patient examined, no change in status, stable for surgery.  I have reviewed the patient's chart and labs.  Questions were answered to the patient's satisfaction.     Thompson Grayer

## 2016-04-05 NOTE — H&P (View-Only) (Signed)
ELECTROPHYSIOLOGY CONSULT NOTE    Patient ID: George [name] MRN: [id number], DOB/AGE: 02-16-1941 75 y.o.  Admit date: 04/05/2016 Date of Consult: 04/05/2016  Primary Physician: [name], MD Primary Cardiologist: Huston Foley MD: Wynonia Lawman  Reason for Consultation: syncope  HPI:  George [name] is a 75 y.o. male with a past medical history significant for arthritis, BPH, colon cancer and syncope.  In April, he was sitting eating dinner and had a feeling that "something was wrong".  He then lost consciousness for seconds and then recovered without symptoms after the episode. He another episode 2 days later that occurred while sitting. He was evaluated with head CT with no abnormalities noted.  In August, he had another episode while standing on a hot day.  He was out for seconds and this episode also occurred without warning. He recovered without ongoing symptoms. EMS noted normal vital signs on their arrival.  Echocardiogram in April of 2017 demonstrated EF 55%, no RWMA, grade 1 diastolic dysfunction.   He currently denies chest pain, shortness of breath, LE edema, recent fevers, chills, nausea and vomiting. He has not had syncope, dizziness, or pre-syncope since August.   Past Medical History:  Diagnosis Date  . Arthritis   . Cancer ([place])    basal cell skin cancer removed from nose  and pre cancerous hemicolectomy years ago   . Small bowel obstruction      Surgical History:  Past Surgical History:  Procedure Laterality Date  . HEMICOLECTOMY    . skin cancer removed from nose     . TOTAL KNEE ARTHROPLASTY Right 03/03/2014   Procedure: RIGHT TOTAL KNEE ARTHROPLASTY;  Surgeon: Gearlean Alf, MD;  Location: [facility];  Service: Orthopedics;  Laterality: Right;     Prescriptions Prior to Admission  Medication Sig Dispense Refill Last Dose  . ANDROGEL PUMP 20.25 MG/ACT (1.62%) GEL Apply 1 application topically daily.    04/04/2016 at Unknown time  . aspirin EC  81 MG tablet Take 81 mg by mouth daily.   04/04/2016 at Unknown time  . buPROPion (WELLBUTRIN XL) 150 MG 24 hr tablet Take 450 mg by mouth every morning.   04/04/2016 at Unknown time  . celecoxib (CELEBREX) 200 MG capsule Take 200 mg by mouth daily.   04/04/2016 at Unknown time  . citalopram (CELEXA) 20 MG tablet Take 20 mg by mouth daily.    04/04/2016 at Unknown time  . doxazosin (CARDURA) 4 MG tablet Take 4 mg by mouth daily.   04/04/2016 at Unknown time  . finasteride (PROSCAR) 5 MG tablet Take 5 mg by mouth every evening.    04/04/2016 at Unknown time  . Multiple Vitamin (MULTIVITAMIN WITH MINERALS) TABS tablet Take 1 tablet by mouth daily.   04/04/2016 at Unknown time  . propranolol (INDERAL) 20 MG tablet Take 20 mg by mouth daily.    04/04/2016 at Unknown time  . cyclobenzaprine (FLEXERIL) 5 MG tablet Take 1 tablet (5 mg total) by mouth 3 (three) times daily as needed for muscle spasms. (Patient not taking: Reported on 02/11/2016) 15 tablet 0 Completed Course at Unknown time  . traMADol (ULTRAM) 50 MG tablet Take 1 tablet (50 mg total) by mouth every 6 (six) hours as needed for moderate pain or severe pain. (Patient not taking: Reported on 04/01/2016) 20 tablet 0 Not Taking at Unknown time    Inpatient Medications:   Allergies: No Known Allergies  Social History   Social History  . Marital status: Widowed  Spouse name: N/A  . Number of children: N/A  . Years of education: N/A   Occupational History  . Not on file.   Social History Main Topics  . Smoking status: Former Research scientist (life sciences)  . Smokeless tobacco: Never Used  . Alcohol use Yes     Comment: 6 beers per week  . Drug use: No  . Sexual activity: Not on file   Other Topics Concern  . Not on file   Social History Narrative  . No narrative on file     Family History  Problem Relation Age of Onset  . Cancer Father   . Diabetes Mellitus II Mother      Review of Systems: All other systems reviewed and are otherwise  negative except as noted above.  Physical Exam: Vitals:   04/05/16 0553  BP: 125/73  Pulse: 67  Resp: 18  Temp: 97.8 F (36.6 C)  TempSrc: Oral  SpO2: 98%  Weight: 173 lb (78.5 kg)  Height: 5\' 8"  (1.727 m)    GEN- The patient is well appearing, alert and oriented x 3 today.   HEENT: normocephalic, atraumatic; sclera clear, conjunctiva pink; hearing intact; oropharynx clear; neck supple Lungs- Clear to ausculation bilaterally, normal work of breathing.  No wheezes, rales, rhonchi Heart- Regular rate and rhythm GI- soft, non-tender, non-distended, bowel sounds present Extremities- no clubbing, cyanosis, or edema MS- no significant deformity or atrophy Skin- warm and dry, no rash or lesion Psych- euthymic mood, full affect Neuro- strength and sensation are intact Carotid massage maneuver normal  Labs:   Lab Results  Component Value Date   WBC 4.5 02/13/2016   HGB 12.7 (L) 02/13/2016   HCT 37.6 (L) 02/13/2016   MCV 91.5 02/13/2016   PLT 184 02/13/2016   No results for input(s): NA, K, CL, CO2, BUN, CREATININE, CALCIUM, PROT, BILITOT, ALKPHOS, ALT, AST, GLUCOSE in the last 168 hours.  Invalid input(s): LABALBU    Radiology/Studies: No results found.  EKG: sinus rhythm, rate 93  Assessment/Plan: 1. Syncope The patient has recurrent unexplained syncope.  His story is concerning for arrhythmic cause.  Recommend ILR implant at this time. Risks, benefits reviewed with patient who wishes to proceed. No driving x6 months per [place] (pt aware)   Signed, Chanetta Marshall, NP 04/05/2016 7:00 AM  I have seen, examined the patient, and reviewed the above assessment and plan.  On exam, RRR.   Changes to above are made where necessary.  Pt with low risk ekg and echo.  No FH of sudden death or arrhythmias.  Denies any ischemic symptoms.  GIven infrequent episodes of syncope, I agree with Dr Wynonia Lawman that a 30 day monitor is unlikely to provide useful information.  I agree that  implantable loop recorder is the appropriate option at this time.  Risks, benefits, and alternatives to ILR implantation were discussed with the patient who wishes to proceed.  DMV driving restrictions reinforced with the patient today.  Wound check in our office in 10 days.  He will follow-up with Dr Wynonia Lawman thereafter.  Co Sign: Thompson Grayer, MD 04/05/2016 7:32 AM

## 2016-04-05 NOTE — Discharge Instructions (Signed)
Patient may remove tegaderm dressing in 24 hours Patient may shower in 24 hours

## 2016-04-05 NOTE — Telephone Encounter (Signed)
New message       Pt had a loop recorder put in this am.  He says it is bleeding a lot.  Please advise

## 2016-04-05 NOTE — Consult Note (Signed)
ELECTROPHYSIOLOGY CONSULT NOTE    Patient ID: George [name] MRN: [id number], DOB/AGE: September 01, 1940 75 y.o.  Admit date: 04/05/2016 Date of Consult: 04/05/2016  Primary Physician: [name], MD Primary Cardiologist: Huston Foley MD: Wynonia Lawman  Reason for Consultation: syncope  HPI:  George [name] is a 75 y.o. male with a past medical history significant for arthritis, BPH, colon cancer and syncope.  In April, he was sitting eating dinner and had a feeling that "something was wrong".  He then lost consciousness for seconds and then recovered without symptoms after the episode. He another episode 2 days later that occurred while sitting. He was evaluated with head CT with no abnormalities noted.  In August, he had another episode while standing on a hot day.  He was out for seconds and this episode also occurred without warning. He recovered without ongoing symptoms. EMS noted normal vital signs on their arrival.  Echocardiogram in April of 2017 demonstrated EF 55%, no RWMA, grade 1 diastolic dysfunction.   He currently denies chest pain, shortness of breath, LE edema, recent fevers, chills, nausea and vomiting. He has not had syncope, dizziness, or pre-syncope since August.   Past Medical History:  Diagnosis Date  . Arthritis   . 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 Small bowel obstruction      Surgical History:  Past Surgical History:  Procedure Laterality Date  . HEMICOLECTOMY    . skin cancer removed from nose     . TOTAL KNEE ARTHROPLASTY Right 03/03/2014   Procedure: RIGHT TOTAL KNEE ARTHROPLASTY;  Surgeon: Gearlean Alf, MD;  Location: [facility];  Service: Orthopedics;  Laterality: Right;     Prescriptions Prior to Admission  Medication Sig Dispense Refill Last Dose  . ANDROGEL PUMP 20.25 MG/ACT (1.62%) GEL Apply 1 application topically daily.    04/04/2016 at Unknown time  . aspirin EC  81 MG tablet Take 81 mg by mouth daily.   04/04/2016 at Unknown time  . buPROPion (WELLBUTRIN XL) 150 MG 24 hr tablet Take 450 mg by mouth every morning.   04/04/2016 at Unknown time  . celecoxib (CELEBREX) 200 MG capsule Take 200 mg by mouth daily.   04/04/2016 at Unknown time  . citalopram (CELEXA) 20 MG tablet Take 20 mg by mouth daily.    04/04/2016 at Unknown time  . doxazosin (CARDURA) 4 MG tablet Take 4 mg by mouth daily.   04/04/2016 at Unknown time  . finasteride (PROSCAR) 5 MG tablet Take 5 mg by mouth every evening.    04/04/2016 at Unknown time  . Multiple Vitamin (MULTIVITAMIN WITH MINERALS) TABS tablet Take 1 tablet by mouth daily.   04/04/2016 at Unknown time  . propranolol (INDERAL) 20 MG tablet Take 20 mg by mouth daily.    04/04/2016 at Unknown time  . cyclobenzaprine (FLEXERIL) 5 MG tablet Take 1 tablet (5 mg total) by mouth 3 (three) times daily as needed for muscle spasms. (Patient not taking: Reported on 02/11/2016) 15 tablet 0 Completed Course at Unknown time  . traMADol (ULTRAM) 50 MG tablet Take 1 tablet (50 mg total) by mouth every 6 (six) hours as needed for moderate pain or severe pain. (Patient not taking: Reported on 04/01/2016) 20 tablet 0 Not Taking at Unknown time    Inpatient Medications:   Allergies: No Known Allergies  Social History   Social History  . Marital status: Widowed  Spouse name: N/A  . Number of children: N/A  . Years of education: N/A   Occupational History  . Not on file.   Social History Main Topics  . Smoking status: Former Research scientist (life sciences)  . Smokeless tobacco: Never Used  . Alcohol use Yes     Comment: 6 beers per week  . Drug use: No  . Sexual activity: Not on file   Other Topics Concern  . Not on file   Social History Narrative  . No narrative on file     Family History  Problem Relation Age of Onset  . Cancer Father   . Diabetes Mellitus II Mother      Review of Systems: All other systems reviewed and are otherwise  negative except as noted above.  Physical Exam: Vitals:   04/05/16 0553  BP: 125/73  Pulse: 67  Resp: 18  Temp: 97.8 F (36.6 C)  TempSrc: Oral  SpO2: 98%  Weight: 173 lb (78.5 kg)  Height: 5\' 8"  (1.727 m)    GEN- The patient is well appearing, alert and oriented x 3 today.   HEENT: normocephalic, atraumatic; sclera clear, conjunctiva pink; hearing intact; oropharynx clear; neck supple Lungs- Clear to ausculation bilaterally, normal work of breathing.  No wheezes, rales, rhonchi Heart- Regular rate and rhythm GI- soft, non-tender, non-distended, bowel sounds present Extremities- no clubbing, cyanosis, or edema MS- no significant deformity or atrophy Skin- warm and dry, no rash or lesion Psych- euthymic mood, full affect Neuro- strength and sensation are intact Carotid massage maneuver normal  Labs:   Lab Results  Component Value Date   WBC 4.5 02/13/2016   HGB 12.7 (L) 02/13/2016   HCT 37.6 (L) 02/13/2016   MCV 91.5 02/13/2016   PLT 184 02/13/2016   No results for input(s): NA, K, CL, CO2, BUN, CREATININE, CALCIUM, PROT, BILITOT, ALKPHOS, ALT, AST, GLUCOSE in the last 168 hours.  Invalid input(s): LABALBU    Radiology/Studies: No results found.  EKG: sinus rhythm, rate 93  Assessment/Plan: 1. Syncope The patient has recurrent unexplained syncope.  His story is concerning for arrhythmic cause.  Recommend ILR implant at this time. Risks, benefits reviewed with patient who wishes to proceed. No driving x6 months per [place] (pt aware)   Signed, Chanetta Marshall, NP 04/05/2016 7:00 AM  I have seen, examined the patient, and reviewed the above assessment and plan.  On exam, RRR.   Changes to above are made where necessary.  Pt with low risk ekg and echo.  No FH of sudden death or arrhythmias.  Denies any ischemic symptoms.  GIven infrequent episodes of syncope, I agree with Dr Wynonia Lawman that a 30 day monitor is unlikely to provide useful information.  I agree that  implantable loop recorder is the appropriate option at this time.  Risks, benefits, and alternatives to ILR implantation were discussed with the patient who wishes to proceed.  DMV driving restrictions reinforced with the patient today.  Wound check in our office in 10 days.  He will follow-up with Dr Wynonia Lawman thereafter.  Co Sign: Thompson Grayer, MD 04/05/2016 7:32 AM

## 2016-04-15 ENCOUNTER — Telehealth: Payer: Self-pay | Admitting: Internal Medicine

## 2016-04-15 NOTE — Telephone Encounter (Signed)
Spoke with patient.  He had symptom episodes yesterday, on 04/14/16, but was unable to use his symptom activator because it was not nearby.  During the evening episodes, patient reports he was lying down and suddenly "felt hot all over".  He feels fine now.  Requested that patient send a manual transmission from his home monitor.  Walked patient through process.  Transmission received, shows pause and brady episodes, some preceded by progressive R-R lengthening.  Advised patient that I will review episodes with Dr. Rayann Heman as soon as he is in the office this afternoon.  Advised him not to drive in interim.  Patient verbalizes understanding.

## 2016-04-15 NOTE — Telephone Encounter (Signed)
New message      Pt has a device.  He states that his cardiologist, Dr Wynonia Lawman, is not getting any transmissions.  Please call

## 2016-04-15 NOTE — Telephone Encounter (Signed)
Reviewed with Dr. Rayann Heman, who recommended that patient come in for OV on Monday, 04/18/16 to discuss episodes.  If patient has repeat episode over the weekend, he should call 911.  Patient should not drive per Dr. Rayann Heman.  Patient made aware of recommendations.  He is agreeable to appointment on 04/18/16 at 2:00pm.  Patient verbalizes understanding of instructions and denies additional questions or concerns at this time.

## 2016-04-18 ENCOUNTER — Encounter: Payer: Self-pay | Admitting: Internal Medicine

## 2016-04-18 ENCOUNTER — Ambulatory Visit (INDEPENDENT_AMBULATORY_CARE_PROVIDER_SITE_OTHER): Payer: Medicare Other | Admitting: Internal Medicine

## 2016-04-18 VITALS — BP 118/62 | HR 74 | Ht 68.0 in | Wt 171.8 lb

## 2016-04-18 DIAGNOSIS — I459 Conduction disorder, unspecified: Secondary | ICD-10-CM | POA: Diagnosis not present

## 2016-04-18 DIAGNOSIS — I495 Sick sinus syndrome: Secondary | ICD-10-CM | POA: Diagnosis not present

## 2016-04-18 NOTE — Patient Instructions (Addendum)
Medication Instructions:  Your physician has recommended you make the following change in your medication:  1) Stop Propranolol    Labwork: None ordered   Testing/Procedures: None ordered   Follow-Up: Your physician recommends that you schedule a follow-up appointment as needed   No Driving

## 2016-04-18 NOTE — Progress Notes (Signed)
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  He is s/p ILR implant for syncope.  On 04/14/16, he had multiple sinus pauses.  He reports presyncope and a "warm" sensation but is otherwise without symptoms.  He denies syncope.  He does not think that he was asleep or having a vagal reaction at the time.  Today, he denies symptoms of palpitations, chest pain, shortness of breath,  lower extremity edema..  The patient is otherwise without complaint today.   Past Medical History:  Diagnosis Date  . Arthritis   . 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 Small bowel obstruction    Past Surgical History:  Procedure Laterality Date  . EP IMPLANTABLE DEVICE N/A 04/05/2016   Procedure: Loop Recorder Insertion;  Surgeon: Thompson Grayer, MD;  Location: [place] [facility];  Service: Cardiovascular;  Laterality: N/A;  . HEMICOLECTOMY    . skin cancer removed from nose     . TOTAL KNEE ARTHROPLASTY Right 03/03/2014   Procedure: RIGHT TOTAL KNEE ARTHROPLASTY;  Surgeon: Gearlean Alf, MD;  Location: [facility];  Service: Orthopedics;  Laterality: Right;    ROS- all systems are reviewed and negatives except as per HPI above  Current Outpatient Prescriptions  Medication Sig Dispense Refill  . ANDROGEL PUMP 20.25 MG/ACT (1.62%) GEL Apply 1 application topically daily.     Marland Kitchen aspirin EC 81 MG tablet Take 81 mg by mouth daily.    Marland Kitchen buPROPion (WELLBUTRIN XL) 150 MG 24 hr tablet Take 450 mg by mouth every morning.    . celecoxib (CELEBREX) 200 MG capsule Take 200 mg by mouth daily.    . citalopram (CELEXA) 20 MG tablet Take 20 mg by mouth daily.     Marland Kitchen doxazosin (CARDURA) 4 MG tablet Take 4 mg by mouth daily.    . finasteride (PROSCAR) 5 MG tablet Take 5 mg by mouth every evening.     . Multiple Vitamin (MULTIVITAMIN WITH MINERALS) TABS tablet  Take 1 tablet by mouth daily.    . propranolol (INDERAL) 20 MG tablet Take 20 mg by mouth daily.      No current facility-administered medications for this visit.     Physical Exam: Vitals:   04/18/16 1404  BP: 118/62  Pulse: 74  Weight: 171 lb 12.8 oz (77.9 kg)  Height: 5\' 8"  (1.727 m)    GEN- The patient is well appearing, alert and oriented x 3 today.   Head- normocephalic, atraumatic Eyes-  Sclera clear, conjunctiva pink Ears- hearing intact Oropharynx- clear Lungs- Clear to ausculation bilaterally, normal work of breathing Heart- Regular rate and rhythm, no murmurs, rubs or gallops, PMI not laterally displaced GI- soft, NT, ND, + BS Extremities- no clubbing, cyanosis, or edema  ILR reviewed with patient today  Assessment and Plan:  1. Sick sinus syndrome with prior syncope Documented to have sinus pauses. I have stopped propranolol today (he was taking this due to tremor). If he continues to have symptomatic episodes, then he is aware that we should proceed at that point with PPM implantation. No driving (pt aware) I will follow remotely with carelink Follow-up with Dr Wynonia Lawman as scheduled.  Thompson Grayer MD, [facility] 04/18/2016 2:33 PM

## 2016-04-20 ENCOUNTER — Ambulatory Visit: Payer: Medicare Other

## 2016-04-21 DIAGNOSIS — D1801 Hemangioma of skin and subcutaneous tissue: Secondary | ICD-10-CM | POA: Diagnosis not present

## 2016-04-21 DIAGNOSIS — Z85828 Personal history of other malignant neoplasm of skin: Secondary | ICD-10-CM | POA: Diagnosis not present

## 2016-04-21 DIAGNOSIS — L821 Other seborrheic keratosis: Secondary | ICD-10-CM | POA: Diagnosis not present

## 2016-04-21 DIAGNOSIS — Z08 Encounter for follow-up examination after completed treatment for malignant neoplasm: Secondary | ICD-10-CM | POA: Diagnosis not present

## 2016-04-25 ENCOUNTER — Telehealth: Payer: Self-pay | Admitting: Cardiology

## 2016-04-25 NOTE — Telephone Encounter (Signed)
Pt called and wanted to talk. He stated that it is really hard to sit and wait for him to pass out to determine if he needs a pacemaker or not. Informed pt that his home monitor is updating like it is suppose to and that if their was ever any episodes or issues someone would call him and talk with him about it. He still wanted to talk to a nurse. Please call him back.

## 2016-04-26 NOTE — Telephone Encounter (Signed)
Spoke with patient and let him know that his LINQ will let us know if there is any change from baseline.  He is just anxious about passing out again.  We have discussed his concerns and he will call back with any problems

## 2016-05-09 ENCOUNTER — Ambulatory Visit (INDEPENDENT_AMBULATORY_CARE_PROVIDER_SITE_OTHER): Payer: Medicare Other | Admitting: *Deleted

## 2016-05-09 DIAGNOSIS — I459 Conduction disorder, unspecified: Secondary | ICD-10-CM | POA: Diagnosis not present

## 2016-05-11 NOTE — Progress Notes (Signed)
Carelink Summary Report / Loop Recorder 

## 2016-05-30 DIAGNOSIS — B349 Viral infection, unspecified: Secondary | ICD-10-CM | POA: Diagnosis not present

## 2016-05-30 DIAGNOSIS — R509 Fever, unspecified: Secondary | ICD-10-CM | POA: Diagnosis not present

## 2016-06-02 DIAGNOSIS — B349 Viral infection, unspecified: Secondary | ICD-10-CM | POA: Diagnosis not present

## 2016-06-02 DIAGNOSIS — K121 Other forms of stomatitis: Secondary | ICD-10-CM | POA: Diagnosis not present

## 2016-06-07 ENCOUNTER — Ambulatory Visit (INDEPENDENT_AMBULATORY_CARE_PROVIDER_SITE_OTHER): Payer: Medicare Other | Admitting: *Deleted

## 2016-06-07 DIAGNOSIS — I459 Conduction disorder, unspecified: Secondary | ICD-10-CM | POA: Diagnosis not present

## 2016-06-08 NOTE — Progress Notes (Signed)
Carelink Summary Report / Loop Recorder 

## 2016-06-14 ENCOUNTER — Other Ambulatory Visit: Payer: Self-pay | Admitting: Internal Medicine

## 2016-06-20 LAB — CUP PACEART REMOTE DEVICE CHECK
Date Time Interrogation Session: 20180108165026
MDC IDC PG IMPLANT DT: 20171024

## 2016-07-04 ENCOUNTER — Ambulatory Visit (INDEPENDENT_AMBULATORY_CARE_PROVIDER_SITE_OTHER): Payer: Medicare Other | Admitting: *Deleted

## 2016-07-04 DIAGNOSIS — I459 Conduction disorder, unspecified: Secondary | ICD-10-CM

## 2016-07-05 NOTE — Progress Notes (Signed)
Carelink Summary Report / Loop Recorder 

## 2016-07-07 ENCOUNTER — Encounter: Payer: Medicare Other | Admitting: Internal Medicine

## 2016-07-07 ENCOUNTER — Telehealth: Payer: Self-pay | Admitting: *Deleted

## 2016-07-07 NOTE — Telephone Encounter (Signed)
[facility] requesting call back.  [facility] phone number for return call.  Patient had ~13 sec pause episode on LINQ on 07/06/16 at 2307.  As of automatic transmission at 0450 today, patient's HR was ~80bpm, sinus rhythm. Will discuss with patient if he was awake at the time and/or symptomatic with episode.  Will make Dr. Rayann Heman aware of patient's symptoms when patient returns call.

## 2016-07-07 NOTE — Telephone Encounter (Addendum)
Patient returned call, states he was asleep at the time of the episode.  He woke up this morning and reports noting a "hard, loud" heartbeat, but does not report any symptoms with the pause episode.  He stopped his propranolol as instructed by Dr. Rayann Heman in 04/2016.  Advised patient I will discuss with provider and call him back.  Advised patient to call 911 if he has any syncopal episodes before hearing back.  He verbalizes understanding.  Spoke with Chanetta Marshall, NP.  She recommended appointment tomorrow to discuss PPM.  Patient is agreeable to appointment with Dr. Rayann Heman on 07/08/16 at 8:30am.  Reiterated recommendations for 911 if he experiences syncope in the interim.  Patient verbalizes understanding of recommendations and denies additional questions.

## 2016-07-08 ENCOUNTER — Encounter: Payer: Self-pay | Admitting: Internal Medicine

## 2016-07-08 ENCOUNTER — Encounter (INDEPENDENT_AMBULATORY_CARE_PROVIDER_SITE_OTHER): Payer: Self-pay

## 2016-07-08 ENCOUNTER — Encounter (HOSPITAL_COMMUNITY): Payer: Self-pay | Admitting: General Practice

## 2016-07-08 ENCOUNTER — Ambulatory Visit (HOSPITAL_COMMUNITY)
Admission: RE | Admit: 2016-07-08 | Discharge: 2016-07-09 | Disposition: A | Discharge status: Home / Self Care | Payer: Medicare Other | Source: Ambulatory Visit | Attending: Cardiology | Admitting: Cardiology

## 2016-07-08 ENCOUNTER — Ambulatory Visit (INDEPENDENT_AMBULATORY_CARE_PROVIDER_SITE_OTHER): Payer: Medicare Other | Admitting: Internal Medicine

## 2016-07-08 ENCOUNTER — Encounter (HOSPITAL_COMMUNITY)
Admission: RE | Disposition: A | Discharge status: Home / Self Care | Payer: Self-pay | Source: Ambulatory Visit | Attending: Cardiology

## 2016-07-08 VITALS — BP 126/70 | HR 80 | Ht 68.0 in | Wt 168.2 lb

## 2016-07-08 DIAGNOSIS — Z95818 Presence of other cardiac implants and grafts: Secondary | ICD-10-CM

## 2016-07-08 DIAGNOSIS — M1990 Unspecified osteoarthritis, unspecified site: Secondary | ICD-10-CM | POA: Diagnosis not present

## 2016-07-08 DIAGNOSIS — I495 Sick sinus syndrome: Secondary | ICD-10-CM | POA: Insufficient documentation

## 2016-07-08 DIAGNOSIS — R55 Syncope and collapse: Secondary | ICD-10-CM | POA: Diagnosis not present

## 2016-07-08 DIAGNOSIS — G250 Essential tremor: Secondary | ICD-10-CM | POA: Insufficient documentation

## 2016-07-08 DIAGNOSIS — E785 Hyperlipidemia, unspecified: Secondary | ICD-10-CM | POA: Insufficient documentation

## 2016-07-08 HISTORY — PX: EP IMPLANTABLE DEVICE: SHX172B

## 2016-07-08 HISTORY — DX: Major depressive disorder, single episode, unspecified: F32.9

## 2016-07-08 HISTORY — DX: Depression, unspecified: F32.A

## 2016-07-08 LAB — CBC
HCT: 42.1 % (ref 39.0–52.0)
Hemoglobin: 15.3 g/dL (ref 13.0–17.0)
MCH: 32.6 pg (ref 26.0–34.0)
MCHC: 36.3 g/dL — AB (ref 30.0–36.0)
MCV: 89.6 fL (ref 78.0–100.0)
PLATELETS: 177 10*3/uL (ref 150–400)
RBC: 4.7 MIL/uL (ref 4.22–5.81)
RDW: 12.9 % (ref 11.5–15.5)
WBC: 7.6 10*3/uL (ref 4.0–10.5)

## 2016-07-08 LAB — BASIC METABOLIC PANEL
ANION GAP: 10 (ref 5–15)
BUN: 19 mg/dL (ref 6–20)
CALCIUM: 9.5 mg/dL (ref 8.9–10.3)
CO2: 24 mmol/L (ref 22–32)
CREATININE: 0.98 mg/dL (ref 0.61–1.24)
Chloride: 108 mmol/L (ref 101–111)
Glucose, Bld: 70 mg/dL (ref 65–99)
Potassium: 3.8 mmol/L (ref 3.5–5.1)
SODIUM: 142 mmol/L (ref 135–145)

## 2016-07-08 LAB — SURGICAL PCR SCREEN
MRSA, PCR: NEGATIVE
STAPHYLOCOCCUS AUREUS: NEGATIVE

## 2016-07-08 SURGERY — PACEMAKER IMPLANT
Anesthesia: LOCAL

## 2016-07-08 MED ORDER — FENTANYL CITRATE (PF) 100 MCG/2ML IJ SOLN
INTRAMUSCULAR | Status: DC | PRN
  Administered 2016-07-08 (×4): 25 ug via INTRAVENOUS

## 2016-07-08 MED ORDER — MUPIROCIN 2 % EX OINT
TOPICAL_OINTMENT | CUTANEOUS | Status: AC
  Administered 2016-07-08: 11:00:00
  Filled 2016-07-08: qty 22

## 2016-07-08 MED ORDER — SODIUM CHLORIDE 0.9 % IR SOLN
80.0000 mg | Status: AC
  Administered 2016-07-08: 80 mg

## 2016-07-08 MED ORDER — MIDAZOLAM HCL 5 MG/5ML IJ SOLN
INTRAMUSCULAR | Status: DC | PRN
  Administered 2016-07-08 (×5): 1 mg via INTRAVENOUS

## 2016-07-08 MED ORDER — CEFAZOLIN IN D5W 1 GM/50ML IV SOLN
1.0000 g | Freq: Four times a day (QID) | INTRAVENOUS | Status: AC
  Administered 2016-07-08 – 2016-07-09 (×3): 1 g via INTRAVENOUS
  Filled 2016-07-08 (×3): qty 50

## 2016-07-08 MED ORDER — LIDOCAINE HCL (PF) 1 % IJ SOLN
INTRAMUSCULAR | Status: AC
  Filled 2016-07-08: qty 30

## 2016-07-08 MED ORDER — CHLORHEXIDINE GLUCONATE 4 % EX LIQD: 60.0000 mL | Freq: Once | CUTANEOUS | Status: DC

## 2016-07-08 MED ORDER — DOXAZOSIN MESYLATE 8 MG PO TABS
8.0000 mg | ORAL_TABLET | Freq: Every day | ORAL | Status: DC
  Administered 2016-07-09: 8 mg via ORAL
  Filled 2016-07-08 (×2): qty 1

## 2016-07-08 MED ORDER — BUPROPION HCL ER (XL) 300 MG PO TB24
450.0000 mg | ORAL_TABLET | Freq: Every morning | ORAL | Status: DC
  Administered 2016-07-09: 09:00:00 450 mg via ORAL
  Filled 2016-07-08: qty 1

## 2016-07-08 MED ORDER — HEPARIN (PORCINE) IN NACL 2-0.9 UNIT/ML-% IJ SOLN
INTRAMUSCULAR | Status: DC | PRN
  Administered 2016-07-08: 500 mL

## 2016-07-08 MED ORDER — ASPIRIN EC 81 MG PO TBEC
81.0000 mg | DELAYED_RELEASE_TABLET | Freq: Every day | ORAL | Status: DC
  Administered 2016-07-09: 81 mg via ORAL
  Filled 2016-07-08: qty 1

## 2016-07-08 MED ORDER — MIDAZOLAM HCL 5 MG/5ML IJ SOLN
INTRAMUSCULAR | Status: AC
  Filled 2016-07-08: qty 5

## 2016-07-08 MED ORDER — FINASTERIDE 5 MG PO TABS: 5.0000 mg | ORAL_TABLET | Freq: Every evening | ORAL | Status: DC

## 2016-07-08 MED ORDER — CEFAZOLIN SODIUM-DEXTROSE 2-4 GM/100ML-% IV SOLN
INTRAVENOUS | Status: AC
  Filled 2016-07-08: qty 100

## 2016-07-08 MED ORDER — LIDOCAINE HCL (PF) 1 % IJ SOLN
INTRAMUSCULAR | Status: DC | PRN
  Administered 2016-07-08: 43 mL

## 2016-07-08 MED ORDER — ACETAMINOPHEN 325 MG PO TABS
325.0000 mg | ORAL_TABLET | ORAL | Status: DC | PRN
  Administered 2016-07-08 – 2016-07-09 (×2): 650 mg via ORAL
  Filled 2016-07-08 (×2): qty 2

## 2016-07-08 MED ORDER — SODIUM CHLORIDE 0.9 % IR SOLN
Status: AC
  Filled 2016-07-08: qty 2

## 2016-07-08 MED ORDER — MUPIROCIN 2 % EX OINT: 1.0000 "application " | TOPICAL_OINTMENT | Freq: Once | CUTANEOUS | Status: DC

## 2016-07-08 MED ORDER — SODIUM CHLORIDE 0.9 % IV SOLN
INTRAVENOUS | Status: DC
  Administered 2016-07-08: 11:00:00 via INTRAVENOUS

## 2016-07-08 MED ORDER — TESTOSTERONE 20.25 MG/ACT (1.62%) TD GEL: 1.0000 "application " | Freq: Every day | TRANSDERMAL | Status: DC

## 2016-07-08 MED ORDER — CEFAZOLIN SODIUM-DEXTROSE 2-4 GM/100ML-% IV SOLN
2.0000 g | INTRAVENOUS | Status: AC
  Administered 2016-07-08: 2 g via INTRAVENOUS

## 2016-07-08 MED ORDER — FENTANYL CITRATE (PF) 100 MCG/2ML IJ SOLN
INTRAMUSCULAR | Status: AC
  Filled 2016-07-08: qty 2

## 2016-07-08 MED ORDER — CELECOXIB 200 MG PO CAPS
200.0000 mg | ORAL_CAPSULE | Freq: Every day | ORAL | Status: DC
  Administered 2016-07-09: 09:00:00 200 mg via ORAL
  Filled 2016-07-08: qty 1

## 2016-07-08 MED ORDER — ADULT MULTIVITAMIN W/MINERALS CH
1.0000 | ORAL_TABLET | Freq: Every day | ORAL | Status: DC
  Administered 2016-07-09: 09:00:00 1 via ORAL
  Filled 2016-07-08: qty 1

## 2016-07-08 MED ORDER — YOU HAVE A PACEMAKER BOOK
Freq: Once | Status: AC
  Administered 2016-07-08: 21:00:00
  Filled 2016-07-08: qty 1

## 2016-07-08 MED ORDER — ONDANSETRON HCL 4 MG/2ML IJ SOLN: 4.0000 mg | Freq: Four times a day (QID) | INTRAMUSCULAR | Status: DC | PRN

## 2016-07-08 SURGICAL SUPPLY — 8 items
CABLE SURGICAL S-101-97-12 (CABLE) ×1 IMPLANT
IPG PACE AZUR XT DR MRI W1DR01 (Pacemaker) IMPLANT
LEAD CAPSURE NOVUS 5076-52CM (Lead) ×1 IMPLANT
LEAD CAPSURE NOVUS 5076-58CM (Lead) ×1 IMPLANT
PACE AZURE XT DR MRI W1DR01 (Pacemaker) ×2 IMPLANT
PAD DEFIB LIFELINK (PAD) ×1 IMPLANT
SHEATH CLASSIC 7F (SHEATH) ×2 IMPLANT
TRAY PACEMAKER INSERTION (PACKS) ×1 IMPLANT

## 2016-07-08 NOTE — Progress Notes (Signed)
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  He is s/p ILR implant for syncope.  He is brought in urgently as remote monitoring has revealed 12 and 13 second sinus pauses.  He reports that though he was asleep with these, he has had 2 presyncopal events yesterday.  These correspond with additional pauses during waking hours yesterday.  Today, he denies symptoms of palpitations, chest pain, shortness of breath,  lower extremity edema..  The patient is otherwise without complaint today.   Past Medical History:  Diagnosis Date  . Arthritis   . 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 Small bowel obstruction    Past Surgical History:  Procedure Laterality Date  . EP IMPLANTABLE DEVICE N/A 04/05/2016   Procedure: Loop Recorder Insertion;  Surgeon: Thompson Grayer, MD;  Location: [place] [facility];  Service: Cardiovascular;  Laterality: N/A;  . HEMICOLECTOMY    . skin cancer removed from nose     . TOTAL KNEE ARTHROPLASTY Right 03/03/2014   Procedure: RIGHT TOTAL KNEE ARTHROPLASTY;  Surgeon: Gearlean Alf, MD;  Location: [facility];  Service: Orthopedics;  Laterality: Right;    ROS- all systems are reviewed and negatives except as per HPI above  Current Outpatient Prescriptions  Medication Sig Dispense Refill  . ANDROGEL PUMP 20.25 MG/ACT (1.62%) GEL Apply 1 application topically daily.     Marland Kitchen aspirin EC 81 MG tablet Take 81 mg by mouth daily.    Marland Kitchen buPROPion (WELLBUTRIN XL) 150 MG 24 hr tablet Take 450 mg by mouth every morning.    . celecoxib (CELEBREX) 200 MG capsule Take 200 mg by mouth daily.    Marland Kitchen doxazosin (CARDURA) 4 MG tablet Take 4 mg by mouth daily.    . finasteride (PROSCAR) 5 MG tablet Take 5 mg by mouth every evening.     . Multiple Vitamin (MULTIVITAMIN WITH MINERALS) TABS tablet Take 1 tablet by mouth  daily.     No current facility-administered medications for this visit.     Physical Exam: Vitals:   07/08/16 0853  BP: 126/70  Pulse: 80  Weight: 168 lb 3.2 oz (76.3 kg)  Height: 5\' 8"  (1.727 m)    GEN- The patient is well appearing, alert and oriented x 3 today.   Head- normocephalic, atraumatic Eyes-  Sclera clear, conjunctiva pink Ears- hearing intact Oropharynx- clear Lungs- Clear to ausculation bilaterally, normal work of breathing Heart- Regular rate and rhythm, no murmurs, rubs or gallops, PMI not laterally displaced GI- soft, NT, ND, + BS Extremities- no clubbing, cyanosis, or edema  ILR reviewed with patient today ekg today reveals sinus rhythm 80 bpm, PR 174 msec, QTC 445 msec  Assessment and Plan:  1. Sick sinus syndrome with presyncope Documented to have sinus pauses as the cause.  No reversible causes.  The patient has symptomatic bradycardia.  I would therefore recommend pacemaker implantation at this time.  Risks, benefits, alternatives to pacemaker implantation were discussed in detail with the patient today. The patient understands that the risks include but are not limited to bleeding, infection, pneumothorax, perforation, tamponade, vascular damage, renal failure, MI, stroke, death,  and lead dislodgement and wishes to proceed. We will therefore schedule the procedure at the next available time.  We will schedule the procedure later today.  He will require implantable  loop recorder removal at the same time.  Today, I have spent 40 minutes with the patient discussing symptomatic bradycardia.  More than 50% of the visit time today was spent on this issue.    Thompson Grayer MD, [facility] 07/08/2016 9:15 AM

## 2016-07-08 NOTE — Discharge Instructions (Signed)
° ° °  Supplemental Discharge Instructions for  Pacemaker/Defibrillator Patients  Activity No heavy lifting or vigorous activity with your left/right arm for 6 to 8 weeks.  Do not raise your left/right arm above your head for one week.  Gradually raise your affected arm as drawn below.             07/12/16                     07/13/16                    07/14/16                     07/15/16 __  NO DRIVING for 1 week, you may begin driving [id number]  WOUND CARE - Keep the wound area clean and dry.  Do not get this area wet for one week. No showers for one week; you may shower on 07/15/16  . - The tape/steri-strips on your wound will fall off; do not pull them off.  No bandage is needed on the site.  DO  NOT apply any creams, oils, or ointments to the wound area. - If you notice any drainage or discharge from the wound, any swelling or bruising at the site, or you develop a fever > 101? F after you are discharged home, call the office at once.  Special Instructions - You are still able to use cellular telephones; use the ear opposite the side where you have your pacemaker/defibrillator.  Avoid carrying your cellular phone near your device. - When traveling through airports, show security personnel your identification card to avoid being screened in the metal detectors.  Ask the security personnel to use the hand wand. - Avoid arc welding equipment, MRI testing (magnetic resonance imaging), TENS units (transcutaneous nerve stimulators).  Call the office for questions about other devices. - Avoid electrical appliances that are in poor condition or are not properly grounded. - Microwave ovens are safe to be near or to operate.  Additional information for defibrillator patients should your device go off: - If your device goes off ONCE and you feel fine afterward, notify the device clinic nurses. - If your device goes off ONCE and you do not feel well afterward, call 911. - If your device goes off TWICE,  call 911. - If your device goes off THREE times in one day, call 911.  DO NOT DRIVE YOURSELF OR A FAMILY MEMBER WITH A DEFIBRILLATOR TO THE HOSPITAL--CALL 911.

## 2016-07-08 NOTE — H&P (Signed)
NEEDHAM MACGOWAN is a 76 y.o. male with a history of syncope and dizziness. On his LINQ monitor, he was found to have prolonged pauses.  On exam, regular rhythm, lungs clear, no murmurs. Presents today for implant of pacemaker.  Risks and benefits discussed. Risks include but not limited to bleeding, infection, tamponade, pneumothorax.  He understands the risks and has agreed to the procedure. Demetria Iwai also plan to remove his LINQ monitor.  Risks of that procedure are bleeding and infection.  Willy Pinkerton Curt Bears, MD 07/08/2016 12:02 PM

## 2016-07-08 NOTE — Discharge Summary (Signed)
ELECTROPHYSIOLOGY PROCEDURE DISCHARGE SUMMARY    Patient ID: George [name],  MRN: [id number], DOB/AGE: 1940-10-25 76 y.o.  Admit date: 07/08/2016 Discharge date: 07/09/2016  Primary Care Physician: [name], MD  Primary Cardiologist: Dr. Wynonia Lawman Electrophysiologist: Dr. Rayann Heman  Primary Discharge Diagnosis:  1. Sinus node dysfunction     S/p PPM implant this admission  Secondary Discharge Diagnosis:  1. Arthritis 2. HLD 3. Essential tremor  No Known Allergies   Procedures This Admission:  1.  Implantation of a Medtronic Advisa Azure [place] DR MRI SureScan dual-chamber pacemaker on 07/08/16 by Dr Curt Bears.  2. ILR removal There were no immediate post procedure complications. 3.  CXR on 1/27 demonstrated no pneumothorax status post device implantation.   Brief HPI: George [name] is a 76 y.o. male had ILR implant for unexplained recurrent syncope.  He is brought in urgently to the office 07/08/16 noting ILR monitoring revealed 12 and 13 second sinus pauses.  He reported that though he was asleep with these, he has had 2 presyncopal events yesterday.  These correspond with additional pauses during waking hours yesterday.  Past medical history includes HLD, essential tremor, arthritis, and HLD.  His BB stopped in November secondary to pauses observed then.  No reversible causes now are identified.  Risks, benefits, and alternatives to PPM implantation were reviewed with the patient who wished to proceed.   Hospital Course:  The patient was admitted and underwent implantation of a PPM with details as outlined above.  He was monitored on telemetry overnight which demonstrated V-pacing overnight and NSR in the morning hours.Left chest was without hematoma or ecchymosis. The device was interrogated and found to be functioning normally.  CXR was obtained and demonstrated no pneumothorax status post device implantation. Wound care, arm mobility, and restrictions were reviewed  with the patient.  The patient was examined by Dr. Caryl Comes and considered stable for discharge to home.    Physical Exam: Vitals:   07/09/16 0000 07/09/16 0253 07/09/16 0435 07/09/16 0729  BP:   131/71 122/60  Pulse: 75  77 76  Resp: 15 (!) 22 15 15   Temp:   97.9 F (36.6 C) 97.2 F (36.2 C)  TempSrc:   Oral Oral  SpO2: 96%  96% 97%  Weight:   174 lb 9.7 oz (79.2 kg)   Height:       GEN- The patient is well appearing, alert and oriented x 3 today.   HEENT: normocephalic, atraumatic; sclera clear, conjunctiva pink; hearing intact; oropharynx clear; neck supple, no JVP Lungs-  CTA b/l, normal work of breathing.  No wheezes, rales, rhonchi Heart-  RRR, no murmurs, rubs or gallops, PMI not laterally displaced GI- soft, non-tender, non-distended Extremities- no clubbing, cyanosis, or edema MS- no significant deformity or atrophy Skin- warm and dry, no rash or lesion, left chest without hematoma/ecchymosis, ILR explant wound is stable/dry Psych- euthymic mood, full affect Neuro- no gross deficits   Labs:   Lab Results  Component Value Date   WBC 7.6 07/08/2016   HGB 15.3 07/08/2016   HCT 42.1 07/08/2016   MCV 89.6 07/08/2016   PLT 177 07/08/2016     Recent Labs Lab 07/08/16 1053  NA 142  K 3.8  CL 108  CO2 24  BUN 19  CREATININE 0.98  CALCIUM 9.5  GLUCOSE 70    Discharge Medications:  Allergies as of 07/09/2016   No Known Allergies     Medication List    TAKE these medications  ANDROGEL PUMP 20.25 MG/ACT (1.62%) Gel Generic drug:  Testosterone Apply 1 application topically daily.   aspirin EC 81 MG tablet Take 81 mg by mouth daily.   buPROPion 150 MG 24 hr tablet Commonly known as:  WELLBUTRIN XL Take 450 mg by mouth every morning.   celecoxib 200 MG capsule Commonly known as:  CELEBREX Take 200 mg by mouth daily.   doxazosin 8 MG tablet Commonly known as:  CARDURA Take 8 mg by mouth daily.   finasteride 5 MG tablet Commonly known as:   PROSCAR Take 5 mg by mouth every evening.   multivitamin with minerals Tabs tablet Take 1 tablet by mouth daily.       Disposition:  Home Discharge Instructions    Increase activity slowly    Complete by:  As directed      Follow-up Information    [facility] Follow up on 07/20/2016.   Specialty:  Cardiology Why:  4:30PM, wound check Contact information: [address]       Thompson Grayer, MD Follow up on 10/10/2016.   Specialty:  Cardiology Why:  3:00PM Contact information: [place] [address] [phone]           Duration of Discharge Encounter: Greater than 30 minutes including physician time.   Signed, Erma Heritage, PA-C 07/09/2016, 9:28 AM Pager: [phone]  Patient seen and examined. X-ray personally reviewed device function normal instructions given

## 2016-07-08 NOTE — Patient Instructions (Signed)
Medication Instructions:  Your physician recommends that you continue on your current medications as directed. Please refer to the Current Medication list given to you today.   Labwork: Your physician recommends that you return for lab work at hospital   Testing/Procedures: Your physician has recommended that you have a pacemaker inserted. A pacemaker is a small device that is placed under the skin of your chest or abdomen to help control abnormal heart rhythms. This device uses electrical pulses to prompt the heart to beat at a normal rate. Pacemakers are used to treat heart rhythms that are too slow. Wire (leads) are attached to the pacemaker that goes into the chambers of you heart. This is done in the hospital and usually requires and overnight stay. Please see the instruction sheet given to you today for more information.---today  Please go to [facility] for one night stay Will need someone to drive you home after the procedure    Follow-Up: Your physician recommends that you schedule a follow-up appointment in: 10-14 days in device clinic for wound check and 3 months with Dr Rayann Heman   Pacemaker Implantation, Adult Pacemaker implantation is a procedure to place a pacemaker inside your chest. A pacemaker is a small computer that sends electrical signals to the heart and helps your heart beat normally. A pacemaker also stores information about your heart rhythms. You may need pacemaker implantation if you:  Have a slow heartbeat (bradycardia).  Faint (syncope).  Have shortness of breath (dyspnea) due to heart problems. The pacemaker attaches to your heart through a wire, called a lead. Sometimes just one lead is needed. Other times, there will be two leads. There are two types of pacemakers:  Transvenous pacemaker. This type is placed under the skin or muscle of your chest. The lead goes through a vein in the chest area to reach the inside  of the heart.  Epicardial pacemaker. This type is placed under the skin or muscle of your chest or belly. The lead goes through your chest to the outside of the heart. Tell a health care provider about:  Any allergies you have.  All medicines you are taking, including vitamins, herbs, eye drops, creams, and over-the-counter medicines.  Any problems you or family members have had with anesthetic medicines.  Any blood or bone disorders you have.  Any surgeries you have had.  Any medical conditions you have.  Whether you are pregnant or may be pregnant. What are the risks? Generally, this is a safe procedure. However, problems may occur, including:  Infection.  Bleeding.  Failure of the pacemaker or the lead.  Collapse of a lung or bleeding into a lung.  Blood clot inside a blood vessel with a lead.  Damage to the heart.  Infection inside the heart (endocarditis).  Allergic reactions to medicines. What happens before the procedure? Staying hydrated  Follow instructions from your health care provider about hydration, which may include:  Up to 2 hours before the procedure - you may continue to drink clear liquids, such as water, clear fruit juice, black coffee, and plain tea. Eating and drinking restrictions  Follow instructions from your health care provider about eating and drinking, which may include:  8 hours before the procedure - stop eating heavy meals or foods such as meat, fried foods, or fatty foods.  6 hours before the procedure - stop eating light meals or foods, such as toast or cereal.  6 hours before the  procedure - stop drinking milk or drinks that contain milk.  2 hours before the procedure - stop drinking clear liquids. Medicines  Ask your health care provider about:  Changing or stopping your regular medicines. This is especially important if you are taking diabetes medicines or blood thinners.  Taking medicines such as aspirin and ibuprofen.  These medicines can thin your blood. Do not take these medicines before your procedure if your health care provider instructs you not to.  You may be given antibiotic medicine to help prevent infection. General instructions  You will have a heart evaluation. This may include an electrocardiogram (ECG), chest X-ray, and heart imaging (echocardiogram,  or echo) tests.  You will have blood tests.  Do not use any products that contain nicotine or tobacco, such as cigarettes and e-cigarettes. If you need help quitting, ask your health care provider.  Plan to have someone take you home from the hospital or clinic.  If you will be going home right after the procedure, plan to have someone with you for 24 hours.  Ask your health care provider how your surgical site will be marked or identified. What happens during the procedure?  To reduce your risk of infection:  Your health care team will wash or sanitize their hands.  Your skin will be washed with soap.  Hair may be removed from the surgical area.  An IV tube will be inserted into one of your veins.  You will be given one or more of the following:  A medicine to help you relax (sedative).  A medicine to numb the area (local anesthetic).  A medicine to make you fall asleep (general anesthetic).  If you are getting a transvenous pacemaker:  An incision will be made in your upper chest.  A pocket will be made for the pacemaker. It may be placed under the skin or between layers of muscle.  The lead will be inserted into a blood vessel that returns to the heart.  While X-rays are taken by an imaging machine (fluoroscopy), the lead will be advanced through the vein to the inside of your heart.  The other end of the lead will be tunneled under the skin and attached to the pacemaker.  If you are getting an epicardial pacemaker:  An incision will be made near your ribs or breastbone (sternum) for the lead.  The lead will be  attached to the outside of your heart.  Another incision will be made in your chest or upper belly to create a pocket for the pacemaker.  The free end of the lead will be tunneled under the skin and attached to the pacemaker.  The transvenous or epicardial pacemaker will be tested. Imaging studies may be done to check the lead position.  The incisions will be closed with stitches (sutures), adhesive strips, or skin glue.  Bandages (dressing) will be placed over the incisions. The procedure may vary among health care providers and hospitals. What happens after the procedure?  Your blood pressure, heart rate, breathing rate, and blood oxygen level will be monitored until the medicines you were given have worn off.  You will be given antibiotics and pain medicine.  ECG and chest x-rays will be done.  You will wear a continuous type of ECG (Holter monitor) to check your heart rhythm.  Your health care provider willprogram the pacemaker.  Do not drive for 24 hours if you received a sedative. This information is not intended to replace advice given to  you by your health care provider. Make sure you discuss any questions you have with your health care provider. Document Released: 05/20/2002 Document Revised: 12/18/2015 Document Reviewed: 11/11/2015 Elsevier Interactive Patient Education  2017 [employer].

## 2016-07-09 ENCOUNTER — Ambulatory Visit (HOSPITAL_COMMUNITY): Payer: Medicare Other

## 2016-07-09 DIAGNOSIS — Z950 Presence of cardiac pacemaker: Secondary | ICD-10-CM | POA: Diagnosis not present

## 2016-07-09 DIAGNOSIS — I495 Sick sinus syndrome: Secondary | ICD-10-CM | POA: Diagnosis not present

## 2016-07-09 DIAGNOSIS — I459 Conduction disorder, unspecified: Secondary | ICD-10-CM | POA: Diagnosis not present

## 2016-07-09 DIAGNOSIS — G250 Essential tremor: Secondary | ICD-10-CM | POA: Diagnosis not present

## 2016-07-09 DIAGNOSIS — E785 Hyperlipidemia, unspecified: Secondary | ICD-10-CM | POA: Diagnosis not present

## 2016-07-09 DIAGNOSIS — R55 Syncope and collapse: Secondary | ICD-10-CM | POA: Diagnosis not present

## 2016-07-09 DIAGNOSIS — M1990 Unspecified osteoarthritis, unspecified site: Secondary | ICD-10-CM | POA: Diagnosis not present

## 2016-07-09 NOTE — Progress Notes (Signed)
Progress Note  Patient Name: RAYQUAN VIAU Date of Encounter: 07/09/2016  Primary Cardiologist: Dr. Wynonia Lawman Primary Electrophysiologist: Dr. Rayann Heman  Subjective   No chest discomfort, palpitations, or dyspnea overnight. Ambulated this AM without difficulty.   Inpatient Medications    Scheduled Meds: . aspirin EC  81 mg Oral Daily  . buPROPion  450 mg Oral q morning - 10a  .  ceFAZolin (ANCEF) IV  1 g Intravenous Q6H  . celecoxib  200 mg Oral Daily  . doxazosin  8 mg Oral Daily  . finasteride  5 mg Oral QPM  . multivitamin with minerals  1 tablet Oral Daily   Continuous Infusions:  PRN Meds: acetaminophen, ondansetron (ZOFRAN) IV   Vital Signs    Vitals:   07/09/16 0000 07/09/16 0253 07/09/16 0435 07/09/16 0729  BP:   131/71 122/60  Pulse: 75  77 76  Resp: 15 (!) 22 15 15   Temp:   97.9 F (36.6 C) 97.2 F (36.2 C)  TempSrc:   Oral Oral  SpO2: 96%  96% 97%  Weight:   174 lb 9.7 oz (79.2 kg)   Height:        Intake/Output Summary (Last 24 hours) at 07/09/16 0831 Last data filed at 07/09/16 [id number]  Gross per 24 hour  Intake              910 ml  Output              900 ml  Net               10 ml   Filed Weights   07/08/16 1012 07/09/16 0435  Weight: 168 lb (76.2 kg) 174 lb 9.7 oz (79.2 kg)    Telemetry    V-paced overnight, now in NSR (HR in 70's - 80's) - Personally Reviewed  ECG    A-sensed, V-paced, HR 75 with pseudo fusion . - Personally Reviewed- Personally Reviewed  Physical Exam   General: Well developed, well nourished Caucasian male appearing in no acute distress. Head: Normocephalic, atraumatic.  Neck: Supple without bruits, JVD not elevated. Lungs:  Resp regular and unlabored, CTA without wheezing or rales. Heart: RRR, S1, S2, no S3, S4, or murmur; no rub. No evidence of hematoma at [facility] site. Abdomen: Soft, non-tender, non-distended with normoactive bowel sounds. No hepatomegaly. No rebound/guarding. No obvious abdominal  masses. Extremities: No clubbing, cyanosis, or edema. Distal pedal pulses are 2+ bilaterally. Neuro: Alert and oriented X 3. Moves all extremities spontaneously. Psych: Normal affect.  Labs    Chemistry Recent Labs Lab 07/08/16 1053  NA 142  K 3.8  CL 108  CO2 24  GLUCOSE 70  BUN 19  CREATININE 0.98  CALCIUM 9.5  GFRNONAA >60  GFRAA >60  ANIONGAP 10     Hematology Recent Labs Lab 07/08/16 1053  WBC 7.6  RBC 4.70  HGB 15.3  HCT 42.1  MCV 89.6  MCH 32.6  MCHC 36.3*  RDW 12.9  PLT 177    Cardiac EnzymesNo results for input(s): TROPONINI in the last 168 hours. No results for input(s): TROPIPOC in the last 168 hours.   BNPNo results for input(s): BNP, PROBNP in the last 168 hours.   DDimer No results for input(s): DDIMER in the last 168 hours.   Radiology    Dg Chest 2 View  Result Date: 07/09/2016 CLINICAL DATA:  Post pacemaker insertion EXAM: CHEST  2 VIEW COMPARISON:  02/11/2016 FINDINGS: Left pacer is in place with leads  in the right atrium and right ventricle. No pneumothorax. Heart is normal size. No confluent opacities or effusions. No acute bony abnormality. IMPRESSION: Left pacer placement without pneumothorax.  No active disease. Electronically Signed   By: Rolm Baptise M.D.   On: 07/09/2016 07:17    Cardiac Studies   Loop Recorder Removal/ PPM Placement: 07/08/2016 Device Placement:  The leads were then connected to a Medtronic Azure XT DR MRI Sure Scan model [id number] (serial number [id number]) pacemaker.  The pocket was irrigated with copious gentamicin solution.  The pacemaker was then placed into the pocket.  The pocket was then closed in 3 layers with 2.0 Vicryl suture for the subcutaneous and 3.0 Vicryl suture subcuticular layers.  Steri-Strips and a sterile dressing were then applied. EBL<79ml. There were no early apparent complications.     LINQ explant: A 1 cm incision was made in the left chest over the existing LINQ incision.  A combination of  blount and sharp dissection was performed down to the [facility] monitor.  The monitor was removed and hemostasis was achieved. One stitch with a 3.0 vicryl was placed and the wound was covered with steri strips.  CONCLUSIONS:   1. Successful implantation of a Medtronic Advisa Azure [place] DR MRI SureScan dual-chamber pacemaker for symptomatic bradycardia  2. LINQ explant  3. No early apparent complications.   Patient Profile     76 y.o. male with past medical history of SBO and recurrent syncope with ILR placement who was found to have 12-13 second sinus pauses. PPM placement was recommended and he presented to [facility] on 07/08/2016 for the procedure.   Assessment & Plan    1. SSS with Presyncope - ILR was placed in the setting of syncope and showed 12-13 second pauses. Presented for loop recorder removal and PPM placement on 1/26. - Successful implantation of a Medtronic Advisa Azure [place] DR MRI SureScan dual-chamber pacemaker was performed. - device interrogated this AM and showed normal functioning. CXR showing no pneumothorax or active disease.   Likely stable for discharge once evaluated by Dr. Caryl Comes.   Signed, Erma Heritage , PA-C 8:31 AM 07/09/2016 Pager: [phone]

## 2016-07-11 ENCOUNTER — Encounter (HOSPITAL_COMMUNITY): Payer: Self-pay | Admitting: Cardiology

## 2016-07-12 LAB — CUP PACEART INCLINIC DEVICE CHECK
Implantable Lead Implant Date: 20180126
Implantable Lead Implant Date: 20180126
Implantable Lead Location: 753859
Implantable Lead Location: 753860
Implantable Lead Model: 5076
Implantable Lead Model: 5076
Implantable Pulse Generator Implant Date: 20171024
MDC IDC SESS DTM: 20180126145741

## 2016-07-19 ENCOUNTER — Other Ambulatory Visit: Payer: Self-pay | Admitting: Internal Medicine

## 2016-07-20 ENCOUNTER — Ambulatory Visit (INDEPENDENT_AMBULATORY_CARE_PROVIDER_SITE_OTHER): Payer: Medicare Other | Admitting: *Deleted

## 2016-07-20 DIAGNOSIS — I495 Sick sinus syndrome: Secondary | ICD-10-CM

## 2016-07-20 DIAGNOSIS — Z950 Presence of cardiac pacemaker: Secondary | ICD-10-CM | POA: Diagnosis not present

## 2016-07-21 LAB — CUP PACEART INCLINIC DEVICE CHECK
Battery Remaining Longevity: 181 mo
Battery Voltage: 3.2 V
Brady Statistic AP VP Percent: 0.01 %
Brady Statistic AP VS Percent: 0.01 %
Brady Statistic AS VP Percent: 0.05 %
Brady Statistic AS VS Percent: 99.93 %
Brady Statistic RV Percent Paced: 0.06 %
Implantable Lead Location: 753860
Implantable Lead Model: 5076
Implantable Pulse Generator Implant Date: 20180126
Lead Channel Impedance Value: 304 Ohm
Lead Channel Impedance Value: 494 Ohm
Lead Channel Impedance Value: 532 Ohm
Lead Channel Impedance Value: 589 Ohm
Lead Channel Pacing Threshold Amplitude: 0.75 V
Lead Channel Pacing Threshold Pulse Width: 0.4 ms
Lead Channel Sensing Intrinsic Amplitude: 2.625 mV
MDC IDC LEAD IMPLANT DT: 20180126
MDC IDC LEAD IMPLANT DT: 20180126
MDC IDC LEAD LOCATION: 753859
MDC IDC MSMT LEADCHNL RA PACING THRESHOLD AMPLITUDE: 1 V
MDC IDC MSMT LEADCHNL RV PACING THRESHOLD PULSEWIDTH: 0.4 ms
MDC IDC MSMT LEADCHNL RV SENSING INTR AMPL: 8.875 mV
MDC IDC SESS DTM: 20180207220027
MDC IDC SET LEADCHNL RA PACING AMPLITUDE: 3.5 V
MDC IDC SET LEADCHNL RV PACING AMPLITUDE: 3.5 V
MDC IDC SET LEADCHNL RV PACING PULSEWIDTH: 0.4 ms
MDC IDC SET LEADCHNL RV SENSING SENSITIVITY: 2 mV
MDC IDC STAT BRADY RA PERCENT PACED: 0.02 %

## 2016-07-21 LAB — CUP PACEART REMOTE DEVICE CHECK
Date Time Interrogation Session: 20171223123732
Date Time Interrogation Session: 20180122124208
Implantable Pulse Generator Implant Date: 20171024
Implantable Pulse Generator Implant Date: 20171024

## 2016-07-21 NOTE — Progress Notes (Signed)
Wound check appointment. Steri-strips removed. Wound without redness or edema. Small, superficial abrasion noted slightly above incision, bandaid applied. Incision edges approximated and wound otherwise well healed. JA saw pt, recommended keeping abrasion covered with bandaid for 48-72hrs, pt verbalizes understanding. Advised to wash site daily with soap and water. Pt educated on signs/symptoms of infection and agrees to call if he notes any symptoms.   Normal device function. Thresholds, sensing, and impedances consistent with implant measurements. Device programmed at 3.5V with auto capture programmed on for extra safety margin until 3 month visit. Histogram distribution appropriate for patient and level of activity. No mode switches or high ventricular rates noted. Patient educated about wound care, arm mobility, lifting restrictions. ROV with JA on 10/10/16.

## 2016-08-02 ENCOUNTER — Telehealth: Payer: Self-pay | Admitting: Internal Medicine

## 2016-08-02 NOTE — Telephone Encounter (Signed)
Spoke to patient regarding discomfort under his arm x 3-4days. Patient states that the discomfort is typically worse when he first wakes up in the morning and gradually gets better throughout the day. Patient states that he believes he may have just "slept the wrong way". Patient denies any redness or edema. Patient states that the site appears to be doing well. I asked patient to continue to monitor the area and call back if the pain continues or if he notices any changes in the site. Patient voiced understanding.

## 2016-08-02 NOTE — Telephone Encounter (Signed)
Did not need this encounter °

## 2016-08-02 NOTE — Telephone Encounter (Signed)
Pt called and stated that he has had some discomfort under his arm and he wanted to see if this could be related to PPM implant.

## 2016-08-15 DIAGNOSIS — R972 Elevated prostate specific antigen [PSA]: Secondary | ICD-10-CM | POA: Diagnosis not present

## 2016-08-17 ENCOUNTER — Telehealth: Payer: Self-pay | Admitting: Internal Medicine

## 2016-08-17 NOTE — Telephone Encounter (Signed)
Per pt call he has a sour left  Under arm...after procedure.   Pt needs a call back please.

## 2016-08-17 NOTE — Telephone Encounter (Signed)
Spoke with patient.  He reports that he is still experiencing intermittent L shoulder pain, unchanged since last week.  Reports pain is a brief, sharp, shooting sensation radiating down left arm, happens daily but does not hurt constantly.  Occurs independent of activity.  He tried taking ASA, but did not find this helpful.  Patient is unsure if the pain is muscular.  Patient reports PPM implant site appears fully healed, no drainage, redness, swelling, fever, or chills.  L arm is not swollen.  Patient has a PCP appointment next week.  Advised I will review symptoms with Dr. Rayann Heman and call him back.  Patient verbalizes understanding and appreciation.  Called patient back to make him aware that per Dr. Rayann Heman, his shoulder discomfort is likely not related to his PPM implant on 1/26.  Patient verbalizes understanding and states he will plan to follow-up with his PCP next week.  He verbalizes understanding of instructions to call with new or worsening cardiac symptoms.

## 2016-08-18 DIAGNOSIS — M542 Cervicalgia: Secondary | ICD-10-CM | POA: Diagnosis not present

## 2016-08-18 DIAGNOSIS — M50322 Other cervical disc degeneration at C5-C6 level: Secondary | ICD-10-CM | POA: Diagnosis not present

## 2016-08-18 DIAGNOSIS — M5412 Radiculopathy, cervical region: Secondary | ICD-10-CM | POA: Diagnosis not present

## 2016-08-18 DIAGNOSIS — M50323 Other cervical disc degeneration at C6-C7 level: Secondary | ICD-10-CM | POA: Diagnosis not present

## 2016-08-22 DIAGNOSIS — N401 Enlarged prostate with lower urinary tract symptoms: Secondary | ICD-10-CM | POA: Diagnosis not present

## 2016-08-22 DIAGNOSIS — R3912 Poor urinary stream: Secondary | ICD-10-CM | POA: Diagnosis not present

## 2016-08-22 DIAGNOSIS — N5201 Erectile dysfunction due to arterial insufficiency: Secondary | ICD-10-CM | POA: Diagnosis not present

## 2016-08-22 DIAGNOSIS — R972 Elevated prostate specific antigen [PSA]: Secondary | ICD-10-CM | POA: Diagnosis not present

## 2016-08-22 DIAGNOSIS — N486 Induration penis plastica: Secondary | ICD-10-CM | POA: Diagnosis not present

## 2016-08-22 DIAGNOSIS — E291 Testicular hypofunction: Secondary | ICD-10-CM | POA: Diagnosis not present

## 2016-08-24 DIAGNOSIS — H2513 Age-related nuclear cataract, bilateral: Secondary | ICD-10-CM | POA: Diagnosis not present

## 2016-08-25 DIAGNOSIS — F3341 Major depressive disorder, recurrent, in partial remission: Secondary | ICD-10-CM | POA: Diagnosis not present

## 2016-08-25 DIAGNOSIS — R251 Tremor, unspecified: Secondary | ICD-10-CM | POA: Diagnosis not present

## 2016-08-25 DIAGNOSIS — Z136 Encounter for screening for cardiovascular disorders: Secondary | ICD-10-CM | POA: Diagnosis not present

## 2016-08-25 DIAGNOSIS — Z0000 Encounter for general adult medical examination without abnormal findings: Secondary | ICD-10-CM | POA: Diagnosis not present

## 2016-08-25 DIAGNOSIS — E291 Testicular hypofunction: Secondary | ICD-10-CM | POA: Diagnosis not present

## 2016-08-25 DIAGNOSIS — Z13220 Encounter for screening for lipoid disorders: Secondary | ICD-10-CM | POA: Diagnosis not present

## 2016-08-26 ENCOUNTER — Other Ambulatory Visit: Payer: Self-pay | Admitting: Family Medicine

## 2016-08-26 DIAGNOSIS — Z136 Encounter for screening for cardiovascular disorders: Secondary | ICD-10-CM

## 2016-09-06 DIAGNOSIS — N486 Induration penis plastica: Secondary | ICD-10-CM | POA: Diagnosis not present

## 2016-09-12 ENCOUNTER — Ambulatory Visit
Admission: RE | Admit: 2016-09-12 | Discharge: 2016-09-12 | Disposition: A | Discharge status: Home / Self Care | Payer: Medicare Other | Source: Ambulatory Visit | Attending: Family Medicine | Admitting: Family Medicine

## 2016-09-12 DIAGNOSIS — Z87891 Personal history of nicotine dependence: Secondary | ICD-10-CM | POA: Diagnosis not present

## 2016-09-12 DIAGNOSIS — Z136 Encounter for screening for cardiovascular disorders: Secondary | ICD-10-CM | POA: Diagnosis not present

## 2016-09-21 ENCOUNTER — Other Ambulatory Visit (HOSPITAL_COMMUNITY): Payer: Self-pay | Admitting: Specialist

## 2016-09-21 DIAGNOSIS — M5412 Radiculopathy, cervical region: Secondary | ICD-10-CM

## 2016-09-30 ENCOUNTER — Ambulatory Visit (HOSPITAL_COMMUNITY)
Admission: RE | Admit: 2016-09-30 | Discharge: 2016-09-30 | Disposition: A | Discharge status: Home / Self Care | Payer: Medicare Other | Source: Ambulatory Visit | Attending: Specialist | Admitting: Specialist

## 2016-09-30 ENCOUNTER — Encounter (HOSPITAL_COMMUNITY): Payer: Self-pay | Admitting: Radiology

## 2016-09-30 DIAGNOSIS — M5021 Other cervical disc displacement,  high cervical region: Secondary | ICD-10-CM | POA: Diagnosis not present

## 2016-09-30 DIAGNOSIS — M5412 Radiculopathy, cervical region: Secondary | ICD-10-CM | POA: Diagnosis not present

## 2016-09-30 DIAGNOSIS — M4802 Spinal stenosis, cervical region: Secondary | ICD-10-CM | POA: Diagnosis not present

## 2016-09-30 DIAGNOSIS — M50221 Other cervical disc displacement at C4-C5 level: Secondary | ICD-10-CM | POA: Diagnosis not present

## 2016-10-07 DIAGNOSIS — M542 Cervicalgia: Secondary | ICD-10-CM | POA: Diagnosis not present

## 2016-10-07 DIAGNOSIS — M5412 Radiculopathy, cervical region: Secondary | ICD-10-CM | POA: Diagnosis not present

## 2016-10-07 DIAGNOSIS — M5032 Other cervical disc degeneration, mid-cervical region, unspecified level: Secondary | ICD-10-CM | POA: Diagnosis not present

## 2016-10-10 ENCOUNTER — Encounter: Payer: Self-pay | Admitting: Internal Medicine

## 2016-10-10 ENCOUNTER — Ambulatory Visit (INDEPENDENT_AMBULATORY_CARE_PROVIDER_SITE_OTHER): Payer: Medicare Other | Admitting: Internal Medicine

## 2016-10-10 VITALS — BP 120/70 | HR 76 | Ht 68.0 in | Wt 174.0 lb

## 2016-10-10 DIAGNOSIS — Z950 Presence of cardiac pacemaker: Secondary | ICD-10-CM | POA: Diagnosis not present

## 2016-10-10 DIAGNOSIS — I495 Sick sinus syndrome: Secondary | ICD-10-CM

## 2016-10-10 LAB — CUP PACEART INCLINIC DEVICE CHECK
Battery Voltage: 3.18 V
Brady Statistic AP VP Percent: 0 %
Brady Statistic AS VP Percent: 0.03 %
Brady Statistic RA Percent Paced: 0.41 %
Date Time Interrogation Session: 20180430164508
Implantable Lead Location: 753859
Implantable Lead Location: 753860
Implantable Lead Model: 5076
Lead Channel Impedance Value: 437 Ohm
Lead Channel Impedance Value: 570 Ohm
Lead Channel Pacing Threshold Amplitude: 1.25 V
Lead Channel Sensing Intrinsic Amplitude: 2.875 mV
Lead Channel Setting Pacing Amplitude: 2.5 V
Lead Channel Setting Pacing Pulse Width: 0.4 ms
Lead Channel Setting Sensing Sensitivity: 2 mV
MDC IDC LEAD IMPLANT DT: 20180126
MDC IDC LEAD IMPLANT DT: 20180126
MDC IDC MSMT BATTERY REMAINING LONGEVITY: 181 mo
MDC IDC MSMT LEADCHNL RA IMPEDANCE VALUE: 323 Ohm
MDC IDC MSMT LEADCHNL RA IMPEDANCE VALUE: 608 Ohm
MDC IDC MSMT LEADCHNL RA PACING THRESHOLD PULSEWIDTH: 0.4 ms
MDC IDC MSMT LEADCHNL RV PACING THRESHOLD AMPLITUDE: 0.75 V
MDC IDC MSMT LEADCHNL RV PACING THRESHOLD PULSEWIDTH: 0.4 ms
MDC IDC MSMT LEADCHNL RV SENSING INTR AMPL: 9.25 mV
MDC IDC PG IMPLANT DT: 20180126
MDC IDC SET LEADCHNL RA PACING AMPLITUDE: 2.25 V
MDC IDC STAT BRADY AP VS PERCENT: 0.41 %
MDC IDC STAT BRADY AS VS PERCENT: 99.55 %
MDC IDC STAT BRADY RV PERCENT PACED: 0.03 %

## 2016-10-10 NOTE — Patient Instructions (Signed)
Medication Instructions:  Your physician recommends that you continue on your current medications as directed. Please refer to the Current Medication list given to you today.   Labwork: None ordered   Testing/Procedures: None ordered   Follow-Up: Your physician wants you to follow-up in: 12 months with Chanetta Marshall, NP You will receive a reminder letter in the mail two months in advance. If you don't receive a letter, please call our office to schedule the follow-up appointment.  Remote monitoring is used to monitor your Pacemaker from home. This monitoring reduces the number of office visits required to check your device to one time per year. It allows [place] to keep an eye on the functioning of your device to ensure it is working properly. You are scheduled for a device check from home on 01/09/17. You may send your transmission at any time that day. If you have a wireless device, the transmission will be sent automatically. After your physician reviews your transmission, you will receive a postcard with your next transmission date.    Any Other Special Instructions Will Be Listed Below (If Applicable).     If you need a refill on your cardiac medications before your next appointment, please call your pharmacy.

## 2016-10-10 NOTE — Progress Notes (Signed)
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  Doing well s/p PPM implantation.  No further presyncope or dizziness.  Today, he denies symptoms of palpitations, chest pain, shortness of breath,  lower extremity edema..  The patient is otherwise without complaint today.   Past Medical History:  Diagnosis Date  . Arthritis    "qwhere" (07/08/2016)  . 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 Depression   . Presence of permanent cardiac pacemaker   . Small bowel obstruction [employer])    Past Surgical History:  Procedure Laterality Date  . APPENDECTOMY  09/2003  . BASAL CELL CARCINOMA EXCISION     nose  . COLECTOMY Right 09/2003   Archie Endo 10/27/2010  . COLON SURGERY    . EP IMPLANTABLE DEVICE N/A 04/05/2016   Procedure: Loop Recorder Insertion;  Surgeon: Thompson Grayer, MD;  Location: [place] [facility];  Service: Cardiovascular;  Laterality: N/A;  . EP IMPLANTABLE DEVICE N/A 07/08/2016   Procedure: Pacemaker Implant;  Surgeon: Will Meredith Leeds, MD;  Location: [facility];  Service: Cardiovascular;  Laterality: N/A;  . EP IMPLANTABLE DEVICE N/A 07/08/2016   Procedure: Loop Recorder Removal;  Surgeon: Will Meredith Leeds, MD;  Location: [place] [facility];  Service: Cardiovascular;  Laterality: N/A;  . JOINT REPLACEMENT    . TONSILLECTOMY    . TOTAL KNEE ARTHROPLASTY Right 03/03/2014   Procedure: RIGHT TOTAL KNEE ARTHROPLASTY;  Surgeon: Gearlean Alf, MD;  [facility];  Service: Orthopedics;  Laterality: Right;  . TOTAL KNEE ARTHROPLASTY Left 01/2005   Archie Endo 10/27/2010    ROS- all systems are reviewed and negatives except as per HPI above  Current Outpatient Prescriptions  Medication Sig Dispense Refill  . ANDROGEL PUMP 20.25 MG/ACT (1.62%) GEL Apply 1 application topically daily.     Marland Kitchen aspirin EC 81  MG tablet Take 81 mg by mouth daily.    Marland Kitchen buPROPion (WELLBUTRIN XL) 150 MG 24 hr tablet Take 450 mg by mouth every morning.    . celecoxib (CELEBREX) 200 MG capsule Take 200 mg by mouth daily.    Marland Kitchen doxazosin (CARDURA) 8 MG tablet Take 8 mg by mouth daily.     . finasteride (PROSCAR) 5 MG tablet Take 5 mg by mouth every evening.     . Multiple Vitamin (MULTIVITAMIN WITH MINERALS) TABS tablet Take 1 tablet by mouth daily.     No current facility-administered medications for this visit.     Physical Exam: Vitals:   10/10/16 1506  BP: 120/70  Pulse: 76  SpO2: 98%  Weight: 174 lb (78.9 kg)  Height: 5\' 8"  (1.727 m)    GEN- The patient is well appearing, alert and oriented x 3 today.   Head- normocephalic, atraumatic Eyes-  Sclera clear, conjunctiva pink Ears- hearing intact Oropharynx- clear Lungs- Clear to ausculation bilaterally, normal work of breathing Heart- Regular rate and rhythm, no murmurs, rubs or gallops, PMI not laterally displaced GI- soft, NT, ND, + BS Extremities- no clubbing, cyanosis, or edema Skin- PPM pocket is well healed  Pacemaker interrogation is personally reviewed with patient today ekg today reveals sinus rhythm 77 bpm, PR 172 msec, QTC 459 msec, LAD  Assessment and Plan:  1. Sick sinus syndrome with presyncope Resolved s/p PPM Doing well Normal pacemaker function See Claudia Desanctis Art  report No changes today carelink  Return to see EP NP in 1 year  Thompson Grayer MD, [facility] 10/10/2016 3:13 PM

## 2016-10-18 ENCOUNTER — Telehealth: Payer: Self-pay | Admitting: Internal Medicine

## 2016-10-18 NOTE — Telephone Encounter (Signed)
Called pt who stated he was at work sitting at desk and he felt like he was about to faint. He felt hot and weak. He then laid on the floor and the symptoms went away. He states last time he fainted, he experienced the same symptoms of feeling hot and weak. Patient denies any chest pain, shortness of breath, or rapid heart rate. He states he feels fine now and symptoms have disappeared. He is concerned about his symptoms and wanted doctor to know about his symptoms. Told patient I would let his doctor know. Advised patient to stay hydrated and drink plenty of water, to make sure he eats enough food so his blood sugar doesn't get too low. Patient verbalized understanding and thanked me for my call. Told patient, if doctor made any recommendations, someone would reach out to pt. Also advised patient to send in a manual transmission of his pacemaker so we can see what his heart was doing to see if it may be related to the symptoms he was experiencing. Pt said he would send in a manual transmission.

## 2016-10-18 NOTE — Telephone Encounter (Signed)
New message      Calling to let nurse know about an episode he just had.  Pt reports sitting at his desk and felt hot, weak and his mouth "filled with water".  Last time this happened, he fainted. So, pt laid down on the floor.  Episode passed and now he is fine.  Pt took his bp while I was on the phone.  It was 118/68 HR 72.  Please advise

## 2016-10-19 NOTE — Telephone Encounter (Signed)
George [name] returning call. Transmission reviewed, normal device function, no episodes recorded, presenting rhythm: sinus rhythm.

## 2016-10-19 NOTE — Telephone Encounter (Signed)
Spoke with pt. He states has talked to [place] at the device clinic. Regarding the transmission. Pt feels fine now, no more episode since yesterday morning. Pt states that he will check his blood sugar to see if that was the problem. Pt verbalized understanding.

## 2016-10-20 DIAGNOSIS — M542 Cervicalgia: Secondary | ICD-10-CM | POA: Diagnosis not present

## 2016-10-20 DIAGNOSIS — M5032 Other cervical disc degeneration, mid-cervical region, unspecified level: Secondary | ICD-10-CM | POA: Diagnosis not present

## 2016-10-20 DIAGNOSIS — M5412 Radiculopathy, cervical region: Secondary | ICD-10-CM | POA: Diagnosis not present

## 2016-10-24 DIAGNOSIS — M5412 Radiculopathy, cervical region: Secondary | ICD-10-CM | POA: Diagnosis not present

## 2016-10-24 DIAGNOSIS — M5032 Other cervical disc degeneration, mid-cervical region, unspecified level: Secondary | ICD-10-CM | POA: Diagnosis not present

## 2016-10-24 DIAGNOSIS — M542 Cervicalgia: Secondary | ICD-10-CM | POA: Diagnosis not present

## 2016-10-27 DIAGNOSIS — M5412 Radiculopathy, cervical region: Secondary | ICD-10-CM | POA: Diagnosis not present

## 2016-10-27 DIAGNOSIS — M542 Cervicalgia: Secondary | ICD-10-CM | POA: Diagnosis not present

## 2016-10-27 DIAGNOSIS — M5032 Other cervical disc degeneration, mid-cervical region, unspecified level: Secondary | ICD-10-CM | POA: Diagnosis not present

## 2016-10-31 DIAGNOSIS — M542 Cervicalgia: Secondary | ICD-10-CM | POA: Diagnosis not present

## 2016-10-31 DIAGNOSIS — M5032 Other cervical disc degeneration, mid-cervical region, unspecified level: Secondary | ICD-10-CM | POA: Diagnosis not present

## 2016-10-31 DIAGNOSIS — M5412 Radiculopathy, cervical region: Secondary | ICD-10-CM | POA: Diagnosis not present

## 2016-11-02 DIAGNOSIS — M5032 Other cervical disc degeneration, mid-cervical region, unspecified level: Secondary | ICD-10-CM | POA: Diagnosis not present

## 2016-11-02 DIAGNOSIS — M542 Cervicalgia: Secondary | ICD-10-CM | POA: Diagnosis not present

## 2016-11-02 DIAGNOSIS — M5412 Radiculopathy, cervical region: Secondary | ICD-10-CM | POA: Diagnosis not present

## 2016-11-11 DIAGNOSIS — K120 Recurrent oral aphthae: Secondary | ICD-10-CM | POA: Diagnosis not present

## 2016-11-14 DIAGNOSIS — Z85828 Personal history of other malignant neoplasm of skin: Secondary | ICD-10-CM | POA: Diagnosis not present

## 2016-11-14 DIAGNOSIS — D485 Neoplasm of uncertain behavior of skin: Secondary | ICD-10-CM | POA: Diagnosis not present

## 2016-11-14 DIAGNOSIS — L720 Epidermal cyst: Secondary | ICD-10-CM | POA: Diagnosis not present

## 2016-11-14 DIAGNOSIS — C44212 Basal cell carcinoma of skin of right ear and external auricular canal: Secondary | ICD-10-CM | POA: Diagnosis not present

## 2016-11-14 DIAGNOSIS — Z08 Encounter for follow-up examination after completed treatment for malignant neoplasm: Secondary | ICD-10-CM | POA: Diagnosis not present

## 2016-11-14 DIAGNOSIS — L905 Scar conditions and fibrosis of skin: Secondary | ICD-10-CM | POA: Diagnosis not present

## 2016-11-29 DIAGNOSIS — C44212 Basal cell carcinoma of skin of right ear and external auricular canal: Secondary | ICD-10-CM | POA: Diagnosis not present

## 2017-01-09 ENCOUNTER — Telehealth: Payer: Self-pay | Admitting: Cardiology

## 2017-01-09 ENCOUNTER — Ambulatory Visit (INDEPENDENT_AMBULATORY_CARE_PROVIDER_SITE_OTHER): Payer: Medicare Other | Admitting: *Deleted

## 2017-01-09 DIAGNOSIS — I495 Sick sinus syndrome: Secondary | ICD-10-CM | POA: Diagnosis not present

## 2017-01-09 NOTE — Telephone Encounter (Signed)
Spoke with pt and reminded pt of remote transmission that is due today. Pt verbalized understanding.   

## 2017-01-10 NOTE — Progress Notes (Signed)
Remote pacemaker transmission.   

## 2017-01-12 LAB — CUP PACEART REMOTE DEVICE CHECK
Brady Statistic AP VP Percent: 0 %
Brady Statistic AP VS Percent: 0.57 %
Brady Statistic AS VP Percent: 0.03 %
Brady Statistic AS VS Percent: 99.4 %
Brady Statistic RA Percent Paced: 0.57 %
Implantable Lead Implant Date: 20180126
Implantable Lead Implant Date: 20180126
Implantable Lead Location: 753860
Implantable Lead Model: 5076
Lead Channel Impedance Value: 323 Ohm
Lead Channel Impedance Value: 437 Ohm
Lead Channel Pacing Threshold Amplitude: 1.125 V
Lead Channel Pacing Threshold Pulse Width: 0.4 ms
Lead Channel Pacing Threshold Pulse Width: 0.4 ms
Lead Channel Setting Pacing Amplitude: 2.25 V
MDC IDC LEAD LOCATION: 753859
MDC IDC MSMT BATTERY REMAINING LONGEVITY: 178 mo
MDC IDC MSMT BATTERY VOLTAGE: 3.15 V
MDC IDC MSMT LEADCHNL RA IMPEDANCE VALUE: 551 Ohm
MDC IDC MSMT LEADCHNL RA SENSING INTR AMPL: 2.75 mV
MDC IDC MSMT LEADCHNL RA SENSING INTR AMPL: 2.75 mV
MDC IDC MSMT LEADCHNL RV IMPEDANCE VALUE: 551 Ohm
MDC IDC MSMT LEADCHNL RV PACING THRESHOLD AMPLITUDE: 0.75 V
MDC IDC MSMT LEADCHNL RV SENSING INTR AMPL: 8.25 mV
MDC IDC MSMT LEADCHNL RV SENSING INTR AMPL: 8.25 mV
MDC IDC PG IMPLANT DT: 20180126
MDC IDC SESS DTM: 20180730150402
MDC IDC SET LEADCHNL RV PACING AMPLITUDE: 2.5 V
MDC IDC SET LEADCHNL RV PACING PULSEWIDTH: 0.4 ms
MDC IDC SET LEADCHNL RV SENSING SENSITIVITY: 2 mV
MDC IDC STAT BRADY RV PERCENT PACED: 0.03 %

## 2017-01-13 ENCOUNTER — Encounter: Payer: Self-pay | Admitting: Cardiology

## 2017-01-13 NOTE — Progress Notes (Signed)
Letter  

## 2017-02-28 DIAGNOSIS — E291 Testicular hypofunction: Secondary | ICD-10-CM | POA: Diagnosis not present

## 2017-03-06 DIAGNOSIS — N5201 Erectile dysfunction due to arterial insufficiency: Secondary | ICD-10-CM | POA: Diagnosis not present

## 2017-03-06 DIAGNOSIS — E291 Testicular hypofunction: Secondary | ICD-10-CM | POA: Diagnosis not present

## 2017-03-06 DIAGNOSIS — N486 Induration penis plastica: Secondary | ICD-10-CM | POA: Diagnosis not present

## 2017-03-06 DIAGNOSIS — N401 Enlarged prostate with lower urinary tract symptoms: Secondary | ICD-10-CM | POA: Diagnosis not present

## 2017-03-06 DIAGNOSIS — R3912 Poor urinary stream: Secondary | ICD-10-CM | POA: Diagnosis not present

## 2017-04-10 ENCOUNTER — Ambulatory Visit (INDEPENDENT_AMBULATORY_CARE_PROVIDER_SITE_OTHER): Payer: Medicare Other | Admitting: *Deleted

## 2017-04-10 DIAGNOSIS — I495 Sick sinus syndrome: Secondary | ICD-10-CM

## 2017-04-11 NOTE — Progress Notes (Signed)
Remote pacemaker transmission.   

## 2017-04-12 LAB — CUP PACEART REMOTE DEVICE CHECK
Battery Voltage: 3.12 V
Brady Statistic AP VS Percent: 0.5 %
Brady Statistic AS VP Percent: 0.03 %
Brady Statistic RA Percent Paced: 0.49 %
Implantable Lead Implant Date: 20180126
Implantable Lead Location: 753859
Implantable Lead Model: 5076
Lead Channel Impedance Value: 494 Ohm
Lead Channel Pacing Threshold Amplitude: 0.625 V
Lead Channel Pacing Threshold Amplitude: 1 V
Lead Channel Pacing Threshold Pulse Width: 0.4 ms
Lead Channel Pacing Threshold Pulse Width: 0.4 ms
Lead Channel Sensing Intrinsic Amplitude: 2.625 mV
Lead Channel Setting Pacing Amplitude: 2.5 V
Lead Channel Setting Pacing Pulse Width: 0.4 ms
Lead Channel Setting Sensing Sensitivity: 2 mV
MDC IDC LEAD IMPLANT DT: 20180126
MDC IDC LEAD LOCATION: 753860
MDC IDC MSMT BATTERY REMAINING LONGEVITY: 175 mo
MDC IDC MSMT LEADCHNL RA IMPEDANCE VALUE: 323 Ohm
MDC IDC MSMT LEADCHNL RA SENSING INTR AMPL: 2.625 mV
MDC IDC MSMT LEADCHNL RV IMPEDANCE VALUE: 380 Ohm
MDC IDC MSMT LEADCHNL RV IMPEDANCE VALUE: 456 Ohm
MDC IDC MSMT LEADCHNL RV SENSING INTR AMPL: 7 mV
MDC IDC MSMT LEADCHNL RV SENSING INTR AMPL: 7 mV
MDC IDC PG IMPLANT DT: 20180126
MDC IDC SESS DTM: 20181029143536
MDC IDC SET LEADCHNL RA PACING AMPLITUDE: 2.25 V
MDC IDC STAT BRADY AP VP PERCENT: 0 %
MDC IDC STAT BRADY AS VS PERCENT: 99.47 %
MDC IDC STAT BRADY RV PERCENT PACED: 0.03 %

## 2017-04-14 DIAGNOSIS — L820 Inflamed seborrheic keratosis: Secondary | ICD-10-CM | POA: Diagnosis not present

## 2017-04-18 ENCOUNTER — Encounter: Payer: Self-pay | Admitting: Cardiology

## 2017-05-02 DIAGNOSIS — Z23 Encounter for immunization: Secondary | ICD-10-CM | POA: Diagnosis not present

## 2017-06-01 DIAGNOSIS — L821 Other seborrheic keratosis: Secondary | ICD-10-CM | POA: Diagnosis not present

## 2017-06-01 DIAGNOSIS — L738 Other specified follicular disorders: Secondary | ICD-10-CM | POA: Diagnosis not present

## 2017-06-01 DIAGNOSIS — D1801 Hemangioma of skin and subcutaneous tissue: Secondary | ICD-10-CM | POA: Diagnosis not present

## 2017-06-01 DIAGNOSIS — Z85828 Personal history of other malignant neoplasm of skin: Secondary | ICD-10-CM | POA: Diagnosis not present

## 2017-06-01 DIAGNOSIS — Z08 Encounter for follow-up examination after completed treatment for malignant neoplasm: Secondary | ICD-10-CM | POA: Diagnosis not present

## 2017-06-01 DIAGNOSIS — D485 Neoplasm of uncertain behavior of skin: Secondary | ICD-10-CM | POA: Diagnosis not present

## 2017-06-01 DIAGNOSIS — L578 Other skin changes due to chronic exposure to nonionizing radiation: Secondary | ICD-10-CM | POA: Diagnosis not present

## 2017-06-29 DIAGNOSIS — J209 Acute bronchitis, unspecified: Secondary | ICD-10-CM | POA: Diagnosis not present

## 2017-07-10 ENCOUNTER — Ambulatory Visit (INDEPENDENT_AMBULATORY_CARE_PROVIDER_SITE_OTHER): Payer: Medicare Other | Admitting: *Deleted

## 2017-07-10 DIAGNOSIS — I495 Sick sinus syndrome: Secondary | ICD-10-CM

## 2017-07-10 NOTE — Progress Notes (Signed)
Remote pacemaker transmission.   

## 2017-07-12 LAB — CUP PACEART REMOTE DEVICE CHECK
Battery Remaining Longevity: 172 mo
Battery Voltage: 3.09 V
Brady Statistic AP VP Percent: 0 %
Brady Statistic AS VP Percent: 0.03 %
Brady Statistic AS VS Percent: 99.47 %
Brady Statistic RA Percent Paced: 0.48 %
Implantable Lead Implant Date: 20180126
Implantable Lead Implant Date: 20180126
Implantable Lead Location: 753860
Implantable Lead Model: 5076
Implantable Pulse Generator Implant Date: 20180126
Lead Channel Impedance Value: 304 Ohm
Lead Channel Impedance Value: 494 Ohm
Lead Channel Pacing Threshold Amplitude: 0.75 V
Lead Channel Pacing Threshold Amplitude: 1 V
Lead Channel Pacing Threshold Pulse Width: 0.4 ms
Lead Channel Pacing Threshold Pulse Width: 0.4 ms
Lead Channel Sensing Intrinsic Amplitude: 2.625 mV
Lead Channel Sensing Intrinsic Amplitude: 2.625 mV
Lead Channel Sensing Intrinsic Amplitude: 5.5 mV
Lead Channel Setting Sensing Sensitivity: 2 mV
MDC IDC LEAD LOCATION: 753859
MDC IDC MSMT LEADCHNL RV IMPEDANCE VALUE: 361 Ohm
MDC IDC MSMT LEADCHNL RV IMPEDANCE VALUE: 437 Ohm
MDC IDC MSMT LEADCHNL RV SENSING INTR AMPL: 5.5 mV
MDC IDC SESS DTM: 20190128043253
MDC IDC SET LEADCHNL RA PACING AMPLITUDE: 2.25 V
MDC IDC SET LEADCHNL RV PACING AMPLITUDE: 2.5 V
MDC IDC SET LEADCHNL RV PACING PULSEWIDTH: 0.4 ms
MDC IDC STAT BRADY AP VS PERCENT: 0.5 %
MDC IDC STAT BRADY RV PERCENT PACED: 0.03 %

## 2017-07-13 ENCOUNTER — Encounter: Payer: Self-pay | Admitting: Cardiology

## 2017-07-19 DIAGNOSIS — H61813 Exostosis of external canal, bilateral: Secondary | ICD-10-CM | POA: Diagnosis not present

## 2017-07-19 DIAGNOSIS — H903 Sensorineural hearing loss, bilateral: Secondary | ICD-10-CM | POA: Insufficient documentation

## 2017-07-19 DIAGNOSIS — H9313 Tinnitus, bilateral: Secondary | ICD-10-CM | POA: Diagnosis not present

## 2017-08-26 ENCOUNTER — Emergency Department (HOSPITAL_BASED_OUTPATIENT_CLINIC_OR_DEPARTMENT_OTHER)
Admission: EM | Admit: 2017-08-26 | Discharge: 2017-08-26 | Disposition: A | Discharge status: Home / Self Care | Payer: Medicare Other | Attending: Emergency Medicine | Admitting: Emergency Medicine

## 2017-08-26 ENCOUNTER — Other Ambulatory Visit: Payer: Self-pay

## 2017-08-26 ENCOUNTER — Encounter (HOSPITAL_BASED_OUTPATIENT_CLINIC_OR_DEPARTMENT_OTHER): Payer: Self-pay

## 2017-08-26 DIAGNOSIS — Z96653 Presence of artificial knee joint, bilateral: Secondary | ICD-10-CM | POA: Insufficient documentation

## 2017-08-26 DIAGNOSIS — Z87891 Personal history of nicotine dependence: Secondary | ICD-10-CM | POA: Diagnosis not present

## 2017-08-26 DIAGNOSIS — L989 Disorder of the skin and subcutaneous tissue, unspecified: Secondary | ICD-10-CM | POA: Insufficient documentation

## 2017-08-26 DIAGNOSIS — Z85828 Personal history of other malignant neoplasm of skin: Secondary | ICD-10-CM | POA: Diagnosis not present

## 2017-08-26 DIAGNOSIS — Z7982 Long term (current) use of aspirin: Secondary | ICD-10-CM | POA: Diagnosis not present

## 2017-08-26 DIAGNOSIS — Z950 Presence of cardiac pacemaker: Secondary | ICD-10-CM | POA: Insufficient documentation

## 2017-08-26 NOTE — ED Provider Notes (Signed)
[facility] EMERGENCY DEPARTMENT Provider Note   CSN: [id number] Arrival date & time: 08/26/17  1323     History   Chief Complaint Chief Complaint  Patient presents with  . nose problem    HPI George [name] is a 77 y.o. male.  The history is provided by the patient. No language interpreter was used.   George [name] is a 77 y.o. male who presents to the Emergency Department complaining of nose problem. He reports two weeks of a dark lesion to the lateral aspect of his nose on the left nasal bridge. He notes that there was some irritation in that area for an undetermined amount of time it related to his classes. About two weeks ago he noted that it was turning dark in color and this is new for him. He denies any fevers, night sweats, weight loss, vision changes, pain, itching chest pain, shortness of breath. He has a history of skin cancer status post removal. He states the prior skin cancers were basal cell carcinoma. He takes Celebrex and has a pacemaker. No additional medical problems. Symptoms are mild, constant, worsening. He contacted [place] dermatologist but cannot get an appointment until mid May. Past Medical History:  Diagnosis Date  . Arthritis    "qwhere" (07/08/2016)  . 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 Depression   . Presence of permanent cardiac pacemaker   . Small bowel obstruction [facility])     Patient Active Problem List   Diagnosis Date Noted  . Syncope 07/08/2016  . Essential hypertension 02/11/2016  . Hyperglycemia 10/04/2015  . SBO (small bowel obstruction) ([place]) 06/29/2014  . H/O hemicolectomy 06/29/2014  . BPH (benign prostatic hyperplasia) 03/07/2014  . Depression 03/07/2014  . OA (osteoarthritis) of knee 03/03/2014    Past Surgical History:  Procedure Laterality Date  . APPENDECTOMY  09/2003  . BASAL CELL CARCINOMA EXCISION     nose  . COLECTOMY Right 09/2003   Archie Endo 10/27/2010  . COLON SURGERY    . EP IMPLANTABLE DEVICE N/A 04/05/2016   Procedure: Loop Recorder Insertion;  Surgeon: Thompson Grayer, MD;  Location: [place] [facility];  Service: Cardiovascular;  Laterality: N/A;  . EP IMPLANTABLE DEVICE N/A 07/08/2016   MDT Azure XT DR implanted by Dr Curt Bears for sick sinus syndrome/ sinus pauses  . EP IMPLANTABLE DEVICE N/A 07/08/2016   Procedure: Loop Recorder Removal;  Surgeon: Will Meredith Leeds, MD;  Location: [place] [facility];  Service: Cardiovascular;  Laterality: N/A;  . JOINT REPLACEMENT    . TONSILLECTOMY    . TOTAL KNEE ARTHROPLASTY Right 03/03/2014   Procedure: RIGHT TOTAL KNEE ARTHROPLASTY;  Surgeon: Gearlean Alf, MD;  [facility];  Service: Orthopedics;  Laterality: Right;  . TOTAL KNEE ARTHROPLASTY Left 01/2005   Archie Endo 10/27/2010       Home Medications    Prior to Admission medications   Medication Sig Start Date End Date Taking? Authorizing Provider  aspirin EC 81 MG tablet Take 81 mg by mouth daily.   Yes [provider]  ANDROGEL PUMP 20.25 MG/ACT (1.62%) GEL Apply 1 application topically daily.  09/08/14   [provider]  buPROPion (WELLBUTRIN XL) 150 MG 24 hr tablet Take 450 mg by mouth every morning.    [provider]  celecoxib (CELEBREX) 200 MG capsule Take 200 mg by mouth daily.    [provider]  doxazosin (  CARDURA) 8 MG tablet Take 8 mg by mouth daily.     [provider]  finasteride (PROSCAR) 5 MG tablet Take 5 mg by mouth every evening.     [provider]  Multiple Vitamin (MULTIVITAMIN WITH MINERALS) TABS tablet Take 1 tablet by mouth daily.    [provider]    Family History Family History  Problem Relation Age of Onset  . Cancer Father   . Diabetes Mellitus II Mother     Social History Social History   Tobacco Use  . Smoking status: Former Smoker    Packs/day: 2.00    Years: 20.00    Pack years: 40.00    Types: Cigarettes     Last attempt to quit: 1984    Years since quitting: 35.2  . Smokeless tobacco: Never Used  Substance Use Topics  . Alcohol use: Yes    Alcohol/week: 3.6 oz    Types: 6 Cans of beer per week  . Drug use: No     Allergies   Patient has no known allergies.   Review of Systems Review of Systems  All other systems reviewed and are negative.    Physical Exam Updated Vital Signs Ht 5\' 8"  (1.727 m)   Wt 78.5 kg (173 lb)   BMI 26.30 kg/m   Physical Exam  Constitutional: He is oriented to person, place, and time. He appears well-developed and well-nourished.  HENT:  Head: Normocephalic and atraumatic.  Right Ear: External ear normal.  Left Ear: External ear normal.  Mouth/Throat: Oropharynx is clear and moist.  There is a well circumscribed half centimeter lesion into the left nasal bridge. Has an even dark color without any surrounding erythema, edema or tenderness.  Eyes: Conjunctivae and EOM are normal. Pupils are equal, round, and reactive to light.  Neck: Neck supple.  Cardiovascular: Normal rate and regular rhythm.  No murmur heard. Pulmonary/Chest: Effort normal and breath sounds normal. No respiratory distress.  Lymphadenopathy:    He has no cervical adenopathy.  Neurological: He is alert and oriented to person, place, and time.  Skin: Skin is warm and dry.  Psychiatric: He has a normal mood and affect. His behavior is normal.  Nursing note and vitals reviewed.    ED Treatments / Results  Labs (all labs ordered are listed, but only abnormal results are displayed) Labs Reviewed - No data to display  EKG  EKG Interpretation None       Radiology No results found.  Procedures Procedures (including critical care time)  Medications Ordered in ED Medications - No data to display   Initial Impression / Assessment and Plan / ED Course  I have reviewed the triage vital signs and the nursing notes.  Pertinent labs & imaging results that were available  during my care of the patient were reviewed by me and considered in my medical decision making (see chart for details).     Here for evaluation of changing skin lesion. He is well appearing on examination and in no acute distress. He does have a well circumscribed lesion on the nasal bridge. There is no evidence of acute infectious process. Discussed with patient that it is important that he gets prompt dermatology follow-up because this is a rapidly evolving lesion. Cannot rule out skin cancer based on this rapid change.  Final Clinical Impressions(s) / ED Diagnoses   Final diagnoses:  Skin lesion    ED Discharge Orders    None  Quintella Reichert, MD 08/26/17 [phone]

## 2017-08-26 NOTE — ED Triage Notes (Signed)
Pt has a black spot on the bridge of his nose for several weeks. Hx of several skin cancers so he is concerned. States he has not been able to get in to see the dermatologist. Denies pain.

## 2017-09-15 DIAGNOSIS — N401 Enlarged prostate with lower urinary tract symptoms: Secondary | ICD-10-CM | POA: Diagnosis not present

## 2017-09-15 DIAGNOSIS — E291 Testicular hypofunction: Secondary | ICD-10-CM | POA: Diagnosis not present

## 2017-09-15 DIAGNOSIS — N5201 Erectile dysfunction due to arterial insufficiency: Secondary | ICD-10-CM | POA: Diagnosis not present

## 2017-09-15 DIAGNOSIS — Z87898 Personal history of other specified conditions: Secondary | ICD-10-CM | POA: Insufficient documentation

## 2017-09-15 DIAGNOSIS — N138 Other obstructive and reflux uropathy: Secondary | ICD-10-CM | POA: Diagnosis not present

## 2017-09-15 DIAGNOSIS — N486 Induration penis plastica: Secondary | ICD-10-CM | POA: Diagnosis not present

## 2017-09-15 DIAGNOSIS — N400 Enlarged prostate without lower urinary tract symptoms: Secondary | ICD-10-CM | POA: Diagnosis not present

## 2017-09-15 DIAGNOSIS — R972 Elevated prostate specific antigen [PSA]: Secondary | ICD-10-CM | POA: Diagnosis not present

## 2017-09-15 DIAGNOSIS — R351 Nocturia: Secondary | ICD-10-CM | POA: Diagnosis not present

## 2017-09-24 IMAGING — US US ABDOMINAL AORTA SCREENING AAA
1 series · 6 of 6 positions shown · non-contrast
Comparison: None.

CLINICAL DATA: Male between 65-75 years of age with a smoking
history.

EXAM:
US ABDOMINAL AORTA MEDICARE SCREENING
TECHNIQUE: Ultrasound examination of the abdominal aorta was performed as a
screening evaluation for abdominal aortic aneurysm.

[Series 1: us abdominal aorta screening aaa · 0.26mm/px · 6 of 6 slices shown]
[im 1/6]
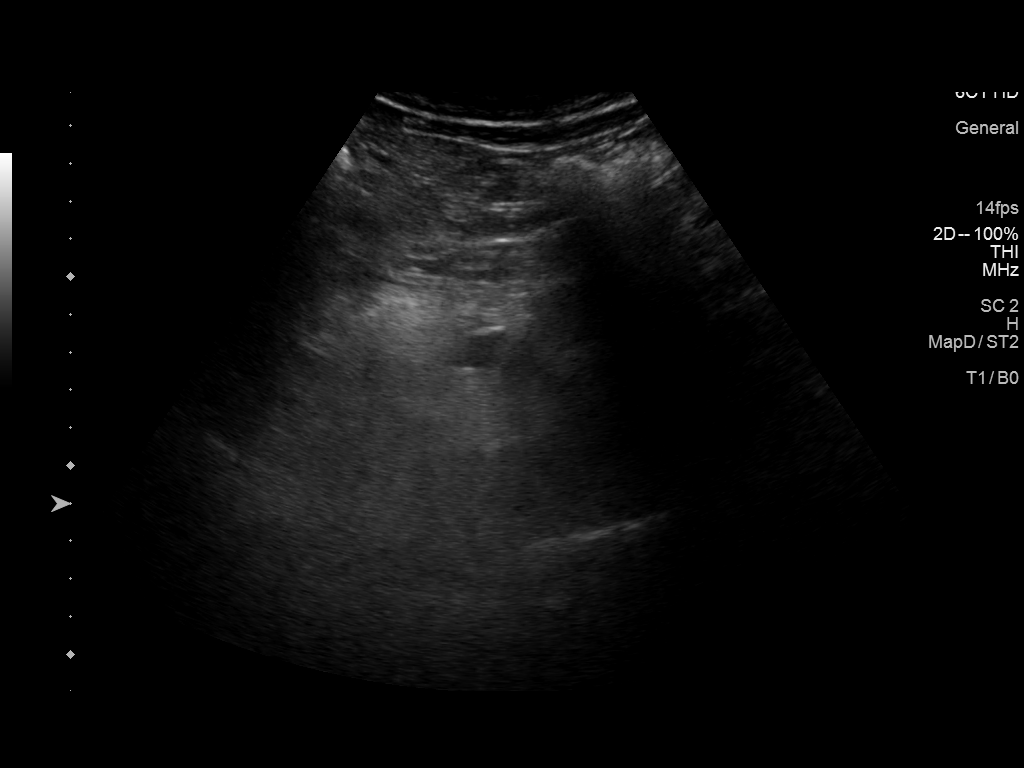
[im 2/6]
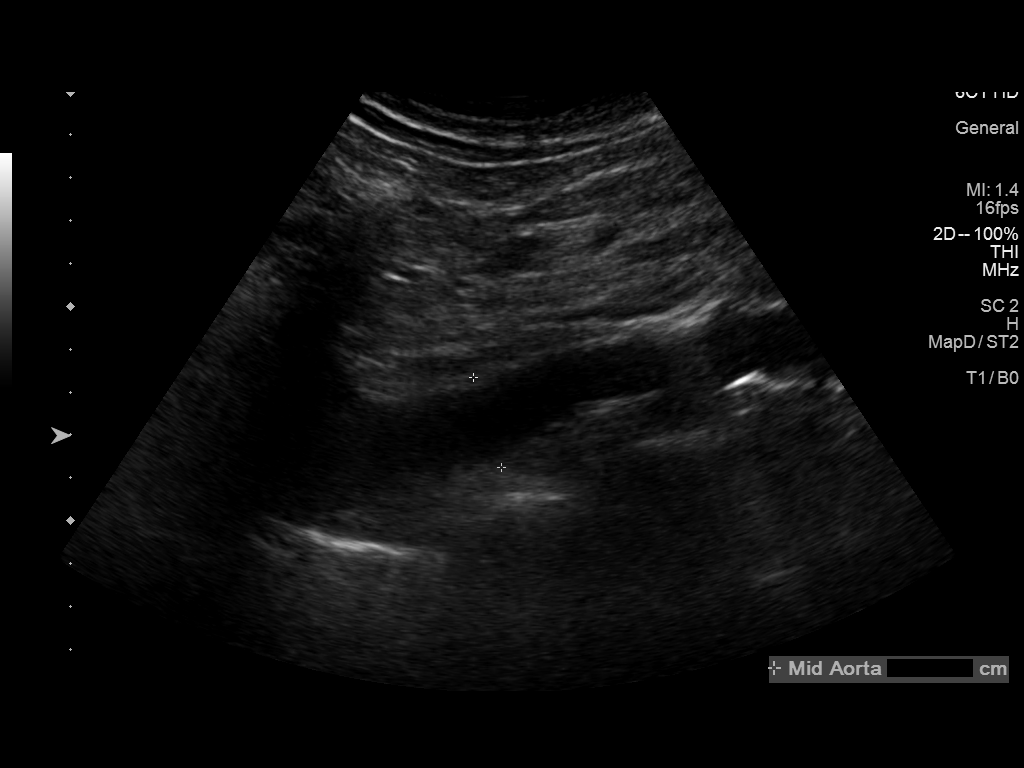
[im 3/6]
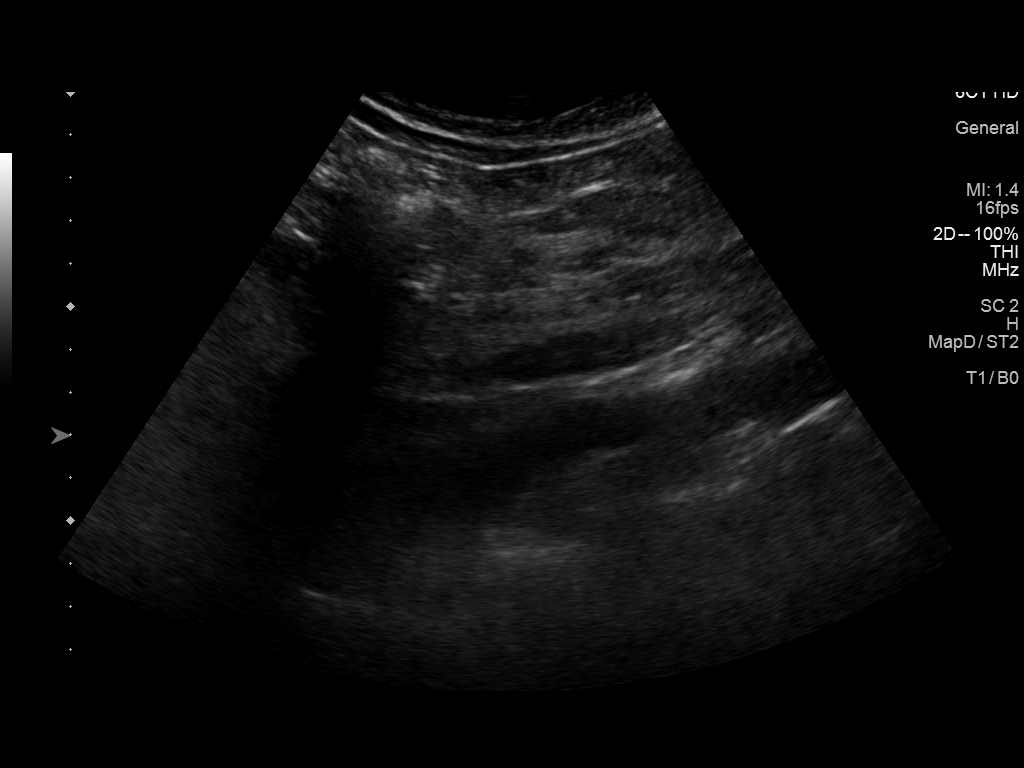
[im 4/6]
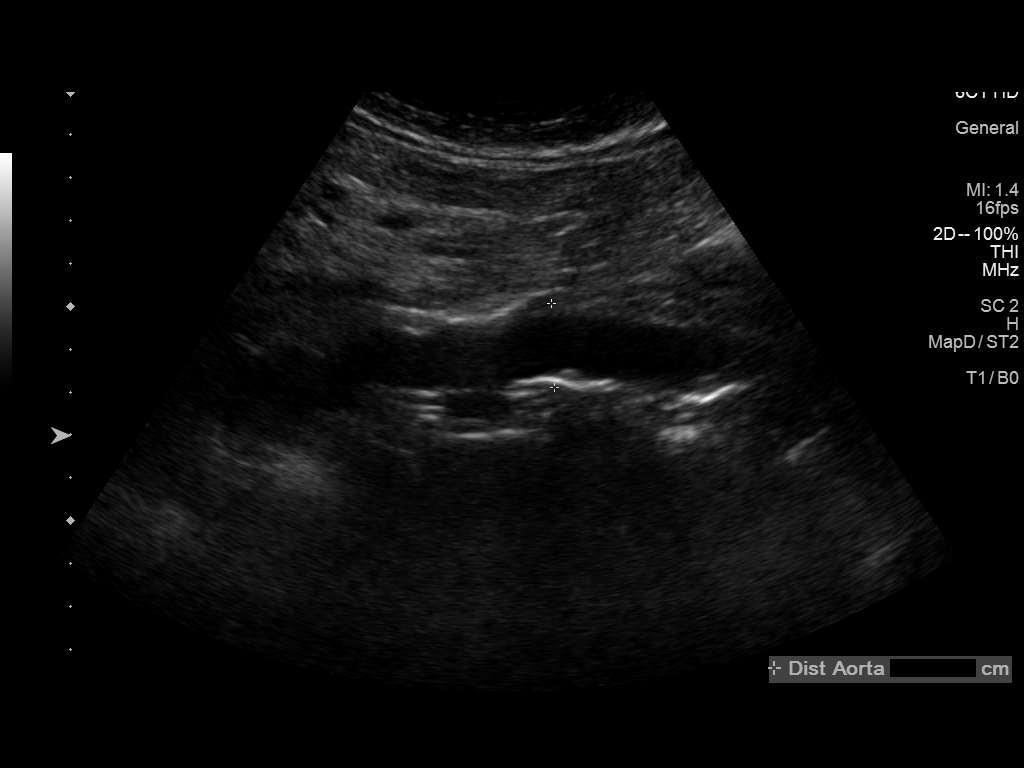
[im 5/6]
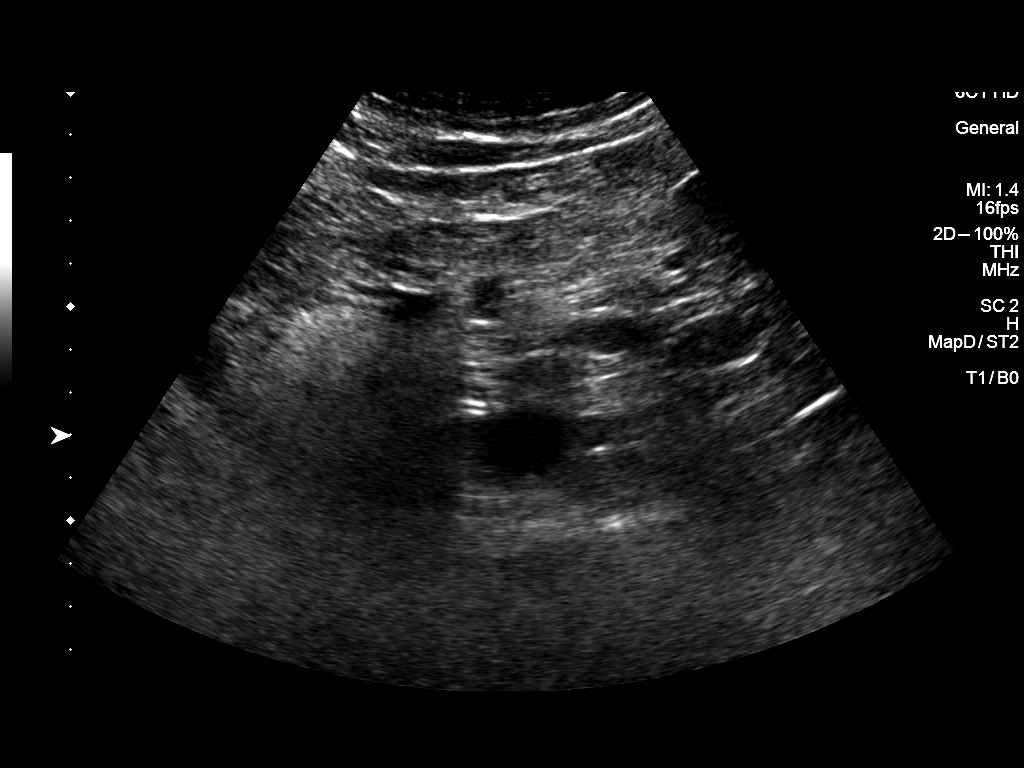
[im 6/6]
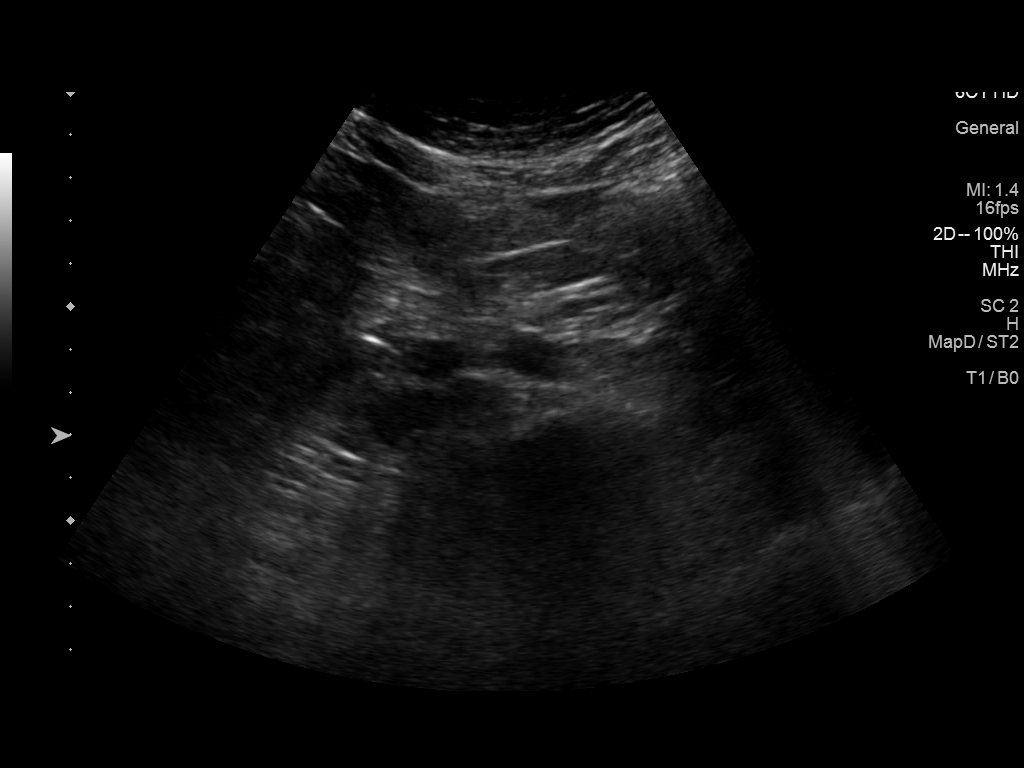

[6 of 6 positions shown; findings below may reference images not displayed]

FINDINGS: Maximum Diameter: 2.2 cm

ABDOMINAL AORTA

Proximal:  Obscured by bowel gas

Mid:  2.2 cm

Distal:  2.0 cm

Aortic atherosclerosis noted with scattered echogenic shadowing
calcifications. Proximal aorta is obscured. No significant aneurysm
of the abdominal aorta.
IMPRESSION: Abdominal aortic atherosclerosis without significant aneurysm.
Maximal diameter 2.2 cm.

## 2017-10-09 ENCOUNTER — Ambulatory Visit (INDEPENDENT_AMBULATORY_CARE_PROVIDER_SITE_OTHER): Payer: Medicare Other | Admitting: *Deleted

## 2017-10-09 DIAGNOSIS — I495 Sick sinus syndrome: Secondary | ICD-10-CM

## 2017-10-09 NOTE — Progress Notes (Signed)
Remote pacemaker transmission.   

## 2017-10-10 ENCOUNTER — Encounter: Payer: Self-pay | Admitting: Cardiology

## 2017-10-11 LAB — CUP PACEART REMOTE DEVICE CHECK
Battery Remaining Longevity: 169 mo
Battery Voltage: 3.07 V
Brady Statistic AS VS Percent: 99.56 %
Brady Statistic RA Percent Paced: 0.41 %
Date Time Interrogation Session: 20190429104306
Implantable Lead Implant Date: 20180126
Implantable Lead Implant Date: 20180126
Implantable Lead Location: 753860
Implantable Lead Model: 5076
Implantable Pulse Generator Implant Date: 20180126
Lead Channel Impedance Value: 323 Ohm
Lead Channel Impedance Value: 475 Ohm
Lead Channel Pacing Threshold Amplitude: 0.625 V
Lead Channel Pacing Threshold Amplitude: 1 V
Lead Channel Pacing Threshold Pulse Width: 0.4 ms
Lead Channel Pacing Threshold Pulse Width: 0.4 ms
Lead Channel Sensing Intrinsic Amplitude: 2.875 mV
MDC IDC LEAD LOCATION: 753859
MDC IDC MSMT LEADCHNL RA SENSING INTR AMPL: 2.875 mV
MDC IDC MSMT LEADCHNL RV IMPEDANCE VALUE: 361 Ohm
MDC IDC MSMT LEADCHNL RV IMPEDANCE VALUE: 418 Ohm
MDC IDC MSMT LEADCHNL RV SENSING INTR AMPL: 5.125 mV
MDC IDC MSMT LEADCHNL RV SENSING INTR AMPL: 5.125 mV
MDC IDC SET LEADCHNL RA PACING AMPLITUDE: 2 V
MDC IDC SET LEADCHNL RV PACING AMPLITUDE: 2.5 V
MDC IDC SET LEADCHNL RV PACING PULSEWIDTH: 0.4 ms
MDC IDC SET LEADCHNL RV SENSING SENSITIVITY: 2 mV
MDC IDC STAT BRADY AP VP PERCENT: 0 %
MDC IDC STAT BRADY AP VS PERCENT: 0.41 %
MDC IDC STAT BRADY AS VP PERCENT: 0.03 %
MDC IDC STAT BRADY RV PERCENT PACED: 0.03 %

## 2017-10-16 DIAGNOSIS — N486 Induration penis plastica: Secondary | ICD-10-CM | POA: Diagnosis not present

## 2017-10-16 DIAGNOSIS — N401 Enlarged prostate with lower urinary tract symptoms: Secondary | ICD-10-CM | POA: Diagnosis not present

## 2017-10-16 DIAGNOSIS — N529 Male erectile dysfunction, unspecified: Secondary | ICD-10-CM | POA: Insufficient documentation

## 2017-10-16 DIAGNOSIS — N138 Other obstructive and reflux uropathy: Secondary | ICD-10-CM | POA: Diagnosis not present

## 2017-10-16 DIAGNOSIS — E291 Testicular hypofunction: Secondary | ICD-10-CM | POA: Diagnosis not present

## 2017-10-19 DIAGNOSIS — L820 Inflamed seborrheic keratosis: Secondary | ICD-10-CM | POA: Diagnosis not present

## 2017-10-27 DIAGNOSIS — F3341 Major depressive disorder, recurrent, in partial remission: Secondary | ICD-10-CM | POA: Diagnosis not present

## 2017-10-27 DIAGNOSIS — R251 Tremor, unspecified: Secondary | ICD-10-CM | POA: Diagnosis not present

## 2017-10-27 DIAGNOSIS — I700 Atherosclerosis of aorta: Secondary | ICD-10-CM | POA: Diagnosis not present

## 2017-10-27 DIAGNOSIS — M170 Bilateral primary osteoarthritis of knee: Secondary | ICD-10-CM | POA: Diagnosis not present

## 2017-10-27 DIAGNOSIS — Z0000 Encounter for general adult medical examination without abnormal findings: Secondary | ICD-10-CM | POA: Diagnosis not present

## 2017-11-07 ENCOUNTER — Encounter: Payer: Self-pay | Admitting: Nurse Practitioner

## 2017-11-07 NOTE — Progress Notes (Signed)
Electrophysiology Office Note Date: 11/09/2017  ID:  George [name] February 02, 1941, MRN [id number]  PCP: George Frees, MD Electrophysiologist: George [name]  CC: Pacemaker follow-up  George [name] is a 77 y.o. male seen today for Dr George [name].  He presents today for routine electrophysiology followup.  Since last being seen in our clinic, the patient reports doing very well. He is planning a trip to the beach for the July 4th weekend.   He denies chest pain, palpitations, dyspnea, PND, orthopnea, nausea, vomiting, dizziness, syncope, edema, weight gain, or early satiety.  Device History: MDT dual chamber PPM implanted 2018 for SSS   Past Medical History:  Diagnosis Date  . Arthritis    "qwhere" (07/08/2016)  . 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 Depression   . Sick sinus syndrome ([place])    a. s/p MDT dual chamber PPM   . Small bowel obstruction ([place])    Past Surgical History:  Procedure Laterality Date  . APPENDECTOMY  09/2003  . BASAL CELL CARCINOMA EXCISION     nose  . COLECTOMY Right 09/2003   Archie Endo 10/27/2010  . COLON SURGERY    . EP IMPLANTABLE DEVICE N/A 04/05/2016   Procedure: Loop Recorder Insertion;  Surgeon: Thompson Grayer, MD;  Location: [facility] [facility];  Service: Cardiovascular;  Laterality: N/A;  . EP IMPLANTABLE DEVICE N/A 07/08/2016   MDT Azure XT DR implanted by Dr Curt Bears for sick sinus syndrome/ sinus pauses  . EP IMPLANTABLE DEVICE N/A 07/08/2016   Procedure: Loop Recorder Removal;  Surgeon: Will Meredith Leeds, MD;  Location: [facility];  Service: Cardiovascular;  Laterality: N/A;  . TONSILLECTOMY    . TOTAL KNEE ARTHROPLASTY Right 03/03/2014   Procedure: RIGHT TOTAL KNEE ARTHROPLASTY;  Surgeon: Gearlean Alf, MD;  [facility];  Service: Orthopedics;  Laterality: Right;  . TOTAL KNEE ARTHROPLASTY Left 01/2005   Archie Endo 10/27/2010    Current Outpatient Medications    Medication Sig Dispense Refill  . ANDROGEL PUMP 20.25 MG/ACT (1.62%) GEL Apply 1 application topically daily.     Marland Kitchen aspirin EC 81 MG tablet Take 81 mg by mouth daily.    Marland Kitchen buPROPion (WELLBUTRIN XL) 150 MG 24 hr tablet Take 450 mg by mouth every morning.    . celecoxib (CELEBREX) 200 MG capsule Take 200 mg by mouth daily.    . citalopram (CELEXA) 20 MG tablet Take 1 tablet by mouth daily.    Marland Kitchen doxazosin (CARDURA) 8 MG tablet Take 8 mg by mouth daily.     . finasteride (PROSCAR) 5 MG tablet Take 5 mg by mouth every evening.     . Multiple Vitamin (MULTIVITAMIN WITH MINERALS) TABS tablet Take 1 tablet by mouth daily.     No current facility-administered medications for this visit.     Allergies:   Patient has no known allergies.   Social History: Social History   Socioeconomic History  . Marital status: Widowed    Spouse name: Not on file  . Number of children: Not on file  . Years of education: Not on file  . Highest education level: Not on file  Occupational History  . Not on file  Social Needs  . Financial resource strain: Not on file  . Food insecurity:    Worry: Not on file    Inability: Not on file  . Transportation needs:    Medical: Not  on file    Non-medical: Not on file  Tobacco Use  . Smoking status: Former Smoker    Packs/day: 2.00    Years: 20.00    Pack years: 40.00    Types: Cigarettes    Last attempt to quit: 1984    Years since quitting: 35.4  . Smokeless tobacco: Never Used  Substance and Sexual Activity  . Alcohol use: Yes    Alcohol/week: 3.6 oz    Types: 6 Cans of beer per week  . Drug use: No  . Sexual activity: Yes  Lifestyle  . Physical activity:    Days per week: Not on file    Minutes per session: Not on file  . Stress: Not on file  Relationships  . Social connections:    Talks on phone: Not on file    Gets together: Not on file    Attends religious service: Not on file    Active member of club or organization: Not on file     Attends meetings of clubs or organizations: Not on file    Relationship status: Not on file  . Intimate partner violence:    Fear of current or ex partner: Not on file    Emotionally abused: Not on file    Physically abused: Not on file    Forced sexual activity: Not on file  Other Topics Concern  . Not on file  Social History Narrative  . Not on file    Family History: Family History  Problem Relation Age of Onset  . Cancer Father   . Diabetes Mellitus II Mother      Review of Systems: All other systems reviewed and are otherwise negative except as noted above.   Physical Exam: VS:  BP 104/60   Pulse 67   Ht 5\' 8"  (1.727 m)   Wt 173 lb 9.6 oz (78.7 kg)   SpO2 95%   BMI 26.40 kg/m  , BMI Body mass index is 26.4 kg/m.  GEN- The patient is well appearing, alert and oriented x 3 today.   HEENT: normocephalic, atraumatic; sclera clear, conjunctiva pink; hearing intact; oropharynx clear; neck supple  Lungs- Clear to ausculation bilaterally, normal work of breathing.  No wheezes, rales, rhonchi Heart- Regular rate and rhythm, no murmurs, rubs or gallops  GI- soft, non-tender, non-distended, bowel sounds present  Extremities- no clubbing, cyanosis, or edema  MS- no significant deformity or atrophy Skin- warm and dry, no rash or lesion; PPM pocket well healed Psych- euthymic mood, full affect Neuro- strength and sensation are intact  PPM Interrogation- reviewed in detail today,  See PACEART report  EKG:  EKG is not ordered today.  Recent Labs: No results found for requested labs within last 8760 hours.   Wt Readings from Last 3 Encounters:  11/09/17 173 lb 9.6 oz (78.7 kg)  08/26/17 173 lb (78.5 kg)  10/10/16 174 lb (78.9 kg)     Other studies Reviewed: Additional studies/ records that were reviewed today include: Dr Jackalyn Lombard office notes  Assessment and Plan:  1.  Sick sinus syndrome Normal PPM function See Claudia Desanctis Art report No changes today   Current  medicines are reviewed at length with the patient today.   The patient does not have concerns regarding his medicines.  The following changes were made today:  none  Labs/ tests ordered today include: none Orders Placed This Encounter  Procedures  . CUP PACEART INCLINIC DEVICE CHECK     Disposition:   Follow up  with [facility], Dr George [name] 1 year     Signed, Chanetta Marshall, NP 11/09/2017 9:03 AM  [facility] [address] [place] [address] [phone] (office) [phone] (fax)

## 2017-11-09 ENCOUNTER — Encounter: Payer: Self-pay | Admitting: Nurse Practitioner

## 2017-11-09 ENCOUNTER — Ambulatory Visit (INDEPENDENT_AMBULATORY_CARE_PROVIDER_SITE_OTHER): Payer: Medicare Other | Admitting: Nurse Practitioner

## 2017-11-09 VITALS — BP 104/60 | HR 67 | Ht 68.0 in | Wt 173.6 lb

## 2017-11-09 DIAGNOSIS — I495 Sick sinus syndrome: Secondary | ICD-10-CM | POA: Diagnosis not present

## 2017-11-09 LAB — CUP PACEART INCLINIC DEVICE CHECK
Implantable Lead Implant Date: 20180126
Implantable Lead Model: 5076
Implantable Lead Model: 5076
Implantable Pulse Generator Implant Date: 20180126
MDC IDC LEAD IMPLANT DT: 20180126
MDC IDC LEAD LOCATION: 753859
MDC IDC LEAD LOCATION: 753860
MDC IDC SESS DTM: 20190530084945

## 2017-11-09 NOTE — Patient Instructions (Addendum)
Medication Instructions:   Your physician recommends that you continue on your current medications as directed. Please refer to the Current Medication list given to you today.   If you need a refill on your cardiac medications before your next appointment, please call your pharmacy.  Labwork: NONE ORDERED  TODAY    Testing/Procedures:  NONE ORDERED  TODAY    Follow-Up: Your physician wants you to follow-up in: [place] will receive a reminder letter in the mail two months in advance. If you don't receive a letter, please call our office to schedule the follow-up appointment.   Remote monitoring is used to monitor your Pacemaker of ICD from home. This monitoring reduces the number of office visits required to check your device to one time per year. It allows [place] to keep an eye on the functioning of your device to ensure it is working properly. You are scheduled for a device check from home on .  01-08-18..You may send your transmission at any time that day. If you have a wireless device, the transmission will be sent automatically. After your physician reviews your transmission, you will receive a postcard with your next transmission date.  '   Any Other Special Instructions Will Be Listed Below (If Applicable).

## 2018-01-08 ENCOUNTER — Ambulatory Visit (INDEPENDENT_AMBULATORY_CARE_PROVIDER_SITE_OTHER): Payer: Medicare Other | Admitting: *Deleted

## 2018-01-08 DIAGNOSIS — I495 Sick sinus syndrome: Secondary | ICD-10-CM | POA: Diagnosis not present

## 2018-01-08 NOTE — Progress Notes (Signed)
Remote pacemaker transmission.   

## 2018-01-09 ENCOUNTER — Encounter: Payer: Self-pay | Admitting: Cardiology

## 2018-01-11 ENCOUNTER — Encounter (HOSPITAL_BASED_OUTPATIENT_CLINIC_OR_DEPARTMENT_OTHER): Payer: Self-pay

## 2018-01-11 ENCOUNTER — Emergency Department (HOSPITAL_BASED_OUTPATIENT_CLINIC_OR_DEPARTMENT_OTHER): Payer: Medicare Other

## 2018-01-11 ENCOUNTER — Other Ambulatory Visit: Payer: Self-pay

## 2018-01-11 ENCOUNTER — Emergency Department (HOSPITAL_BASED_OUTPATIENT_CLINIC_OR_DEPARTMENT_OTHER)
Admission: EM | Admit: 2018-01-11 | Discharge: 2018-01-11 | Disposition: A | Discharge status: Home / Self Care | Payer: Medicare Other | Attending: Emergency Medicine | Admitting: Emergency Medicine

## 2018-01-11 DIAGNOSIS — I10 Essential (primary) hypertension: Secondary | ICD-10-CM | POA: Diagnosis not present

## 2018-01-11 DIAGNOSIS — Y92019 Unspecified place in single-family (private) house as the place of occurrence of the external cause: Secondary | ICD-10-CM | POA: Insufficient documentation

## 2018-01-11 DIAGNOSIS — S63502A Unspecified sprain of left wrist, initial encounter: Secondary | ICD-10-CM | POA: Diagnosis not present

## 2018-01-11 DIAGNOSIS — S6992XA Unspecified injury of left wrist, hand and finger(s), initial encounter: Secondary | ICD-10-CM | POA: Diagnosis not present

## 2018-01-11 DIAGNOSIS — W108XXA Fall (on) (from) other stairs and steps, initial encounter: Secondary | ICD-10-CM | POA: Diagnosis not present

## 2018-01-11 DIAGNOSIS — S63501A Unspecified sprain of right wrist, initial encounter: Secondary | ICD-10-CM | POA: Diagnosis not present

## 2018-01-11 DIAGNOSIS — Z79899 Other long term (current) drug therapy: Secondary | ICD-10-CM | POA: Insufficient documentation

## 2018-01-11 DIAGNOSIS — Y998 Other external cause status: Secondary | ICD-10-CM | POA: Insufficient documentation

## 2018-01-11 DIAGNOSIS — Z87891 Personal history of nicotine dependence: Secondary | ICD-10-CM | POA: Insufficient documentation

## 2018-01-11 DIAGNOSIS — Z950 Presence of cardiac pacemaker: Secondary | ICD-10-CM | POA: Diagnosis not present

## 2018-01-11 DIAGNOSIS — Z85828 Personal history of other malignant neoplasm of skin: Secondary | ICD-10-CM | POA: Insufficient documentation

## 2018-01-11 DIAGNOSIS — M25532 Pain in left wrist: Secondary | ICD-10-CM | POA: Diagnosis not present

## 2018-01-11 DIAGNOSIS — Y9301 Activity, walking, marching and hiking: Secondary | ICD-10-CM | POA: Diagnosis not present

## 2018-01-11 DIAGNOSIS — Z96651 Presence of right artificial knee joint: Secondary | ICD-10-CM | POA: Insufficient documentation

## 2018-01-11 DIAGNOSIS — Z96652 Presence of left artificial knee joint: Secondary | ICD-10-CM | POA: Insufficient documentation

## 2018-01-11 NOTE — ED Provider Notes (Addendum)
[facility] EMERGENCY DEPARTMENT Provider Note   CSN: [id number] Arrival date & time: 01/11/18  1531     History   Chief Complaint Chief Complaint  Patient presents with  . Wrist Injury    HPI George [name] is a 77 y.o. male.  Pt is a 77 y/o male with hx of Sick sinus syndrome s/p pacemaker presenting after a mechanical fall when [place]  Landed on his left wrist and both knees.  Since that time he has had pain on the ulnar portion of the left wrist.  NO other c/o.  Able to walking without difficulty and no head injury or LOC.  The history is provided by the patient.  Wrist Injury   The incident occurred 2 days ago. The incident occurred at home. The injury mechanism was a fall. The pain is present in the left wrist. The quality of the pain is described as aching. The pain is at a severity of 2/10. The pain is mild. The pain has been constant since the incident. The symptoms are aggravated by movement, palpation and use. Treatments tried: wrist brace. The treatment provided mild relief.    Past Medical History:  Diagnosis Date  . Arthritis    "qwhere" (07/08/2016)  . 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 Depression   . Sick sinus syndrome ([place])    a. s/p MDT dual chamber PPM   . Small bowel obstruction [facility])     Patient Active Problem List   Diagnosis Date Noted  . Syncope 07/08/2016  . Essential hypertension 02/11/2016  . Hyperglycemia 10/04/2015  . SBO (small bowel obstruction) ([place]) 06/29/2014  . H/O hemicolectomy 06/29/2014  . BPH (benign prostatic hyperplasia) 03/07/2014  . Depression 03/07/2014  . OA (osteoarthritis) of knee 03/03/2014    Past Surgical History:  Procedure Laterality Date  . APPENDECTOMY  09/2003  . BASAL CELL CARCINOMA EXCISION     nose  . COLECTOMY Right 09/2003   Archie Endo 10/27/2010  . COLON SURGERY    . EP IMPLANTABLE  DEVICE N/A 04/05/2016   Procedure: Loop Recorder Insertion;  Surgeon: Thompson Grayer, MD;  Location: [place] CV LAB;  Service: Cardiovascular;  Laterality: N/A;  . EP IMPLANTABLE DEVICE N/A 07/08/2016   MDT Azure XT DR implanted by Dr Curt Bears for sick sinus syndrome/ sinus pauses  . EP IMPLANTABLE DEVICE N/A 07/08/2016   Procedure: Loop Recorder Removal;  Surgeon: Will Meredith Leeds, MD;  Location: [place] CV LAB;  Service: Cardiovascular;  Laterality: N/A;  . TONSILLECTOMY    . TOTAL KNEE ARTHROPLASTY Right 03/03/2014   Procedure: RIGHT TOTAL KNEE ARTHROPLASTY;  Surgeon: Gearlean Alf, MD;  Location: [facility];  Service: Orthopedics;  Laterality: Right;  . TOTAL KNEE ARTHROPLASTY Left 01/2005   Archie Endo 10/27/2010        Home Medications    Prior to Admission medications   Medication Sig Start Date End Date Taking? Authorizing Provider  ANDROGEL PUMP 20.25 MG/ACT (1.62%) GEL Apply 1 application topically daily.  09/08/14   [provider]  aspirin EC 81 MG tablet Take 81 mg by mouth daily.    [provider]  buPROPion (WELLBUTRIN XL) 150 MG 24 hr tablet Take 450 mg by mouth every morning.    [provider]  celecoxib (CELEBREX) 200 MG capsule Take 200 mg by mouth daily.  [provider]  citalopram (CELEXA) 20 MG tablet Take 1 tablet by mouth daily. 11/03/17   [provider]  doxazosin (CARDURA) 8 MG tablet Take 8 mg by mouth daily.     [provider]  finasteride (PROSCAR) 5 MG tablet Take 5 mg by mouth every evening.     [provider]  Multiple Vitamin (MULTIVITAMIN WITH MINERALS) TABS tablet Take 1 tablet by mouth daily.    [provider]    Family History Family History  Problem Relation Age of Onset  . Cancer Father   . Diabetes Mellitus II Mother     Social History Social History   Tobacco Use  . Smoking status: Former Smoker    Packs/day: 2.00    Years: 20.00    Pack years: 40.00     Types: Cigarettes    Last attempt to quit: 1984    Years since quitting: 35.6  . Smokeless tobacco: Never Used  Substance Use Topics  . Alcohol use: Yes    Alcohol/week: 3.6 oz    Types: 6 Cans of beer per week  . Drug use: No     Allergies   Patient has no known allergies.   Review of Systems Review of Systems  All other systems reviewed and are negative.    Physical Exam Updated Vital Signs BP 137/81 (BP Location: Right Arm)   Pulse 63   Temp 98.3 F (36.8 C) (Oral)   Resp 18   Ht 5\' 8"  (1.727 m)   Wt 79.8 kg (176 lb)   SpO2 98%   BMI 26.76 kg/m   Physical Exam  Constitutional: He is oriented to person, place, and time. He appears well-developed and well-nourished. No distress.  HENT:  Head: Normocephalic and atraumatic.  Cardiovascular: Normal rate.  Pulmonary/Chest: Effort normal.  Musculoskeletal: He exhibits tenderness. He exhibits no edema or deformity.       Arms:      Legs: 2+ left radial pulse  Neurological: He is alert and oriented to person, place, and time.  Skin: Skin is warm and dry.  Psychiatric: He has a normal mood and affect. His behavior is normal.  Nursing note and vitals reviewed.    ED Treatments / Results  Labs (all labs ordered are listed, but only abnormal results are displayed) Labs Reviewed - No data to display  EKG None  Radiology Dg Wrist Complete Left  Result Date: 01/11/2018 CLINICAL DATA:  Left wrist pain after falling 2 days ago EXAM: LEFT WRIST - COMPLETE 3+ VIEW COMPARISON:  None. FINDINGS: Faint linear density dorsal to the proximal carpus is likely vascular.There is mild chondrocalcinosis at the left radiocarpal joint with first carpometacarpal joint osteoarthrosis. IMPRESSION: No acute fracture or dislocation of the left wrist. Electronically Signed   By: Ulyses Jarred M.D.   On: 01/11/2018 16:19    Procedures Procedures (including critical care time)  Medications Ordered in ED Medications - No data to  display   Initial Impression / Assessment and Plan / ED Course  I have reviewed the triage vital signs and the nursing notes.  Pertinent labs & imaging results that were available during my care of the patient were reviewed by me and considered in my medical decision making (see chart for details).    Pt with mechanical fall on outstretched hand with pain in the distal ulna.  No snuffbox tenderness and NV intact.  No localized wrist tenderness but tenderness over shaft of the ulna.  Imaging is  neg.  Pt has a velcro wrist splint will have him continue to use that and will give f/u with [facility] if not improved in 1-2 weeks.  Final Clinical Impressions(s) / ED Diagnoses   Final diagnoses:  Wrist sprain, left, initial encounter    ED Discharge Orders    None       Blanchie Dessert, MD 01/11/18 7026    Blanchie Dessert, MD 01/11/18 1635

## 2018-01-11 NOTE — ED Notes (Signed)
ED Provider at bedside. 

## 2018-01-11 NOTE — ED Triage Notes (Signed)
Pt states he fell 2 days ago-pain to left wrist-wearing velcro splint-NAD-steady gait

## 2018-01-12 LAB — CUP PACEART REMOTE DEVICE CHECK
Battery Remaining Longevity: 166 mo
Battery Voltage: 3.05 V
Brady Statistic AP VS Percent: 0.39 %
Brady Statistic RA Percent Paced: 0.39 %
Brady Statistic RV Percent Paced: 0.03 %
Date Time Interrogation Session: 20190729055326
Implantable Lead Implant Date: 20180126
Implantable Lead Location: 753859
Implantable Lead Location: 753860
Implantable Lead Model: 5076
Implantable Pulse Generator Implant Date: 20180126
Lead Channel Impedance Value: 418 Ohm
Lead Channel Pacing Threshold Amplitude: 1 V
Lead Channel Pacing Threshold Pulse Width: 0.4 ms
Lead Channel Sensing Intrinsic Amplitude: 2.625 mV
Lead Channel Sensing Intrinsic Amplitude: 2.625 mV
Lead Channel Sensing Intrinsic Amplitude: 5.625 mV
Lead Channel Setting Pacing Amplitude: 2.5 V
Lead Channel Setting Sensing Sensitivity: 2 mV
MDC IDC LEAD IMPLANT DT: 20180126
MDC IDC MSMT LEADCHNL RA IMPEDANCE VALUE: 323 Ohm
MDC IDC MSMT LEADCHNL RA IMPEDANCE VALUE: 494 Ohm
MDC IDC MSMT LEADCHNL RV IMPEDANCE VALUE: 361 Ohm
MDC IDC MSMT LEADCHNL RV PACING THRESHOLD AMPLITUDE: 0.75 V
MDC IDC MSMT LEADCHNL RV PACING THRESHOLD PULSEWIDTH: 0.4 ms
MDC IDC MSMT LEADCHNL RV SENSING INTR AMPL: 5.625 mV
MDC IDC SET LEADCHNL RA PACING AMPLITUDE: 2 V
MDC IDC SET LEADCHNL RV PACING PULSEWIDTH: 0.4 ms
MDC IDC STAT BRADY AP VP PERCENT: 0 %
MDC IDC STAT BRADY AS VP PERCENT: 0.03 %
MDC IDC STAT BRADY AS VS PERCENT: 99.58 %

## 2018-02-05 DIAGNOSIS — K115 Sialolithiasis: Secondary | ICD-10-CM | POA: Diagnosis not present

## 2018-02-05 DIAGNOSIS — H6122 Impacted cerumen, left ear: Secondary | ICD-10-CM | POA: Diagnosis not present

## 2018-02-05 DIAGNOSIS — Z23 Encounter for immunization: Secondary | ICD-10-CM | POA: Diagnosis not present

## 2018-03-15 DIAGNOSIS — K115 Sialolithiasis: Secondary | ICD-10-CM | POA: Diagnosis not present

## 2018-03-25 ENCOUNTER — Other Ambulatory Visit: Payer: Self-pay

## 2018-03-25 ENCOUNTER — Emergency Department (HOSPITAL_BASED_OUTPATIENT_CLINIC_OR_DEPARTMENT_OTHER)
Admission: EM | Admit: 2018-03-25 | Discharge: 2018-03-25 | Disposition: A | Discharge status: Home / Self Care | Payer: Medicare Other | Attending: Emergency Medicine | Admitting: Emergency Medicine

## 2018-03-25 DIAGNOSIS — Z85828 Personal history of other malignant neoplasm of skin: Secondary | ICD-10-CM | POA: Insufficient documentation

## 2018-03-25 DIAGNOSIS — R300 Dysuria: Secondary | ICD-10-CM | POA: Insufficient documentation

## 2018-03-25 DIAGNOSIS — Z95810 Presence of automatic (implantable) cardiac defibrillator: Secondary | ICD-10-CM | POA: Insufficient documentation

## 2018-03-25 DIAGNOSIS — I10 Essential (primary) hypertension: Secondary | ICD-10-CM | POA: Insufficient documentation

## 2018-03-25 DIAGNOSIS — R39198 Other difficulties with micturition: Secondary | ICD-10-CM | POA: Diagnosis not present

## 2018-03-25 DIAGNOSIS — Z87891 Personal history of nicotine dependence: Secondary | ICD-10-CM | POA: Insufficient documentation

## 2018-03-25 DIAGNOSIS — Z96653 Presence of artificial knee joint, bilateral: Secondary | ICD-10-CM | POA: Diagnosis not present

## 2018-03-25 DIAGNOSIS — R3914 Feeling of incomplete bladder emptying: Secondary | ICD-10-CM | POA: Insufficient documentation

## 2018-03-25 DIAGNOSIS — R3129 Other microscopic hematuria: Secondary | ICD-10-CM | POA: Insufficient documentation

## 2018-03-25 DIAGNOSIS — Z79899 Other long term (current) drug therapy: Secondary | ICD-10-CM | POA: Insufficient documentation

## 2018-03-25 DIAGNOSIS — Z7982 Long term (current) use of aspirin: Secondary | ICD-10-CM | POA: Diagnosis not present

## 2018-03-25 LAB — URINALYSIS, ROUTINE W REFLEX MICROSCOPIC
Bilirubin Urine: NEGATIVE
GLUCOSE, UA: NEGATIVE mg/dL
KETONES UR: NEGATIVE mg/dL
Leukocytes, UA: NEGATIVE
Nitrite: NEGATIVE
PH: 6 (ref 5.0–8.0)
PROTEIN: NEGATIVE mg/dL
Specific Gravity, Urine: 1.01 (ref 1.005–1.030)

## 2018-03-25 LAB — URINALYSIS, MICROSCOPIC (REFLEX)

## 2018-03-25 MED ORDER — TAMSULOSIN HCL 0.4 MG PO CAPS: 0.4000 mg | ORAL_CAPSULE | Freq: Every day | ORAL | 0 refills | Status: AC

## 2018-03-25 NOTE — ED Provider Notes (Signed)
Complains of pressure sensation at suprapubic area onset 2 days ago and difficulty getting urinary stream started.  He denies burning on urination denies fever denies pain no nausea or vomiting.  No treatment prior to coming here on exam no distress abdomen soft nondistended nontender.  Genitalia normal male.  Rectum normal tone prostate is smooth and nontender.  No masses.   Orlie Dakin, MD 03/25/18 2137

## 2018-03-25 NOTE — Discharge Instructions (Signed)
The Flomax to help with urinary hesitancy can cause you to feel lightheaded.  Please use caution and take time when going from laying to sitting, and sitting to standing.  Today your blood pressure was mildly elevated, please follow-up with your primary care doctor for a recheck.  Please schedule an appointment with your urologist. Your urine is currently being cultured.  If this shows infection you will be contacted regarding the need for antibiotics.

## 2018-03-25 NOTE — ED Provider Notes (Signed)
[facility] EMERGENCY DEPARTMENT Provider Note   CSN: [id number] Arrival date & time: 03/25/18  0350     History   Chief Complaint Chief Complaint  Patient presents with  . Dysuria    HPI George [name] is a 77 y.o. male with a past medical history of BPH who presents today for evaluation of pain with urination.  He reports that since Friday he has had pain when he attempts to urinate and difficulty starting a urinary stream.  He denies any fevers or chills.  He reports that he feels like he is going to the bathroom frequently and is unable to fully empty his bladder.  He denies any flank pain.  No abdominal pain.  He has a urologist who he sees twice a year for BPH.  HPI  Past Medical History:  Diagnosis Date  . Arthritis    "qwhere" (07/08/2016)  . 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 Depression   . Sick sinus syndrome ([place])    a. s/p MDT dual chamber PPM   . Small bowel obstruction [facility])     Patient Active Problem List   Diagnosis Date Noted  . Syncope 07/08/2016  . Essential hypertension 02/11/2016  . Hyperglycemia 10/04/2015  . SBO (small bowel obstruction) ([place]) 06/29/2014  . H/O hemicolectomy 06/29/2014  . BPH (benign prostatic hyperplasia) 03/07/2014  . Depression 03/07/2014  . OA (osteoarthritis) of knee 03/03/2014    Past Surgical History:  Procedure Laterality Date  . APPENDECTOMY  09/2003  . BASAL CELL CARCINOMA EXCISION     nose  . COLECTOMY Right 09/2003   Archie Endo 10/27/2010  . COLON SURGERY    . EP IMPLANTABLE DEVICE N/A 04/05/2016   Procedure: Loop Recorder Insertion;  Surgeon: Thompson Grayer, MD;  Location: [facility] CV LAB;  Service: Cardiovascular;  Laterality: N/A;  . EP IMPLANTABLE DEVICE N/A 07/08/2016   MDT Azure XT DR implanted by Dr Curt Bears for sick sinus syndrome/ sinus pauses  . EP IMPLANTABLE DEVICE N/A 07/08/2016   Procedure: Loop Recorder Removal;   Surgeon: Will Meredith Leeds, MD;  Location: [place] CV LAB;  Service: Cardiovascular;  Laterality: N/A;  . TONSILLECTOMY    . TOTAL KNEE ARTHROPLASTY Right 03/03/2014   Procedure: RIGHT TOTAL KNEE ARTHROPLASTY;  Surgeon: Gearlean Alf, MD;  [facility];  Service: Orthopedics;  Laterality: Right;  . TOTAL KNEE ARTHROPLASTY Left 01/2005   Archie Endo 10/27/2010        Home Medications    Prior to Admission medications   Medication Sig Start Date End Date Taking? Authorizing Provider  ANDROGEL PUMP 20.25 MG/ACT (1.62%) GEL Apply 1 application topically daily.  09/08/14   [provider]  aspirin EC 81 MG tablet Take 81 mg by mouth daily.    [provider]  buPROPion (WELLBUTRIN XL) 150 MG 24 hr tablet Take 450 mg by mouth every morning.    [provider]  celecoxib (CELEBREX) 200 MG capsule Take 200 mg by mouth daily.    [provider]  citalopram (CELEXA) 20 MG tablet Take 1 tablet by mouth daily. 11/03/17   [provider]  doxazosin (CARDURA) 8 MG tablet Take 8 mg by mouth daily.     [provider]  finasteride (PROSCAR) 5 MG tablet Take 5 mg by mouth every evening.     [provider]  Multiple Vitamin (MULTIVITAMIN WITH MINERALS) TABS tablet  Take 1 tablet by mouth daily.    [provider]  tamsulosin (FLOMAX) 0.4 MG CAPS capsule Take 1 capsule (0.4 mg total) by mouth daily for 14 days. 03/25/18 04/08/18  Lorin Glass, PA-C    Family History Family History  Problem Relation Age of Onset  . Cancer Father   . Diabetes Mellitus II Mother     Social History Social History   Tobacco Use  . Smoking status: Former Smoker    Packs/day: 2.00    Years: 20.00    Pack years: 40.00    Types: Cigarettes    Last attempt to quit: 1984    Years since quitting: 35.8  . Smokeless tobacco: Never Used  Substance Use Topics  . Alcohol use: Yes    Alcohol/week: 6.0 standard drinks    Types: 6 Cans of  beer per week  . Drug use: No     Allergies   Patient has no known allergies.   Review of Systems Review of Systems  Constitutional: Negative for chills and fever.  Gastrointestinal: Negative for abdominal pain, constipation, diarrhea, nausea and vomiting.  Genitourinary: Positive for difficulty urinating, dysuria, frequency and urgency. Negative for discharge, flank pain, hematuria, penile pain, penile swelling, scrotal swelling and testicular pain.  All other systems reviewed and are negative.    Physical Exam Updated Vital Signs BP (!) 148/86 (BP Location: Right Arm)   Pulse 71   Temp 98.4 F (36.9 C) (Oral)   Resp 20   Ht 5' 8.5" (1.74 m)   Wt 78 kg   SpO2 98%   BMI 25.77 kg/m   Physical Exam  Constitutional: He appears well-developed and well-nourished. No distress.  HENT:  Head: Normocephalic and atraumatic.  Eyes: Conjunctivae are normal. Right eye exhibits no discharge. Left eye exhibits no discharge. No scleral icterus.  Neck: Normal range of motion.  Cardiovascular: Normal rate, regular rhythm and normal heart sounds.  Pulmonary/Chest: Effort normal and breath sounds normal. No stridor. No respiratory distress.  Abdominal: Soft. Bowel sounds are normal. He exhibits no distension. There is no tenderness. There is no guarding.  No CVA tenderness to percussion bilaterally.  Musculoskeletal: He exhibits no edema or deformity.  Neurological: He is alert. He exhibits normal muscle tone.  Skin: Skin is warm and dry. He is not diaphoretic.  Psychiatric: He has a normal mood and affect. His behavior is normal.  Nursing note and vitals reviewed.    ED Treatments / Results  Labs (all labs ordered are listed, but only abnormal results are displayed) Labs Reviewed  URINALYSIS, ROUTINE W REFLEX MICROSCOPIC - Abnormal; Notable for the following components:      Result Value   Color, Urine STRAW (*)    Hgb urine dipstick TRACE (*)    All other components within  normal limits  URINALYSIS, MICROSCOPIC (REFLEX) - Abnormal; Notable for the following components:   Bacteria, UA FEW (*)    All other components within normal limits  URINE CULTURE    EKG None  Radiology No results found.  Procedures Procedures (including critical care time)  Medications Ordered in ED Medications - No data to display   Initial Impression / Assessment and Plan / ED Course  I have reviewed the triage vital signs and the nursing notes.  Pertinent labs & imaging results that were available during my care of the patient were reviewed by me and considered in my medical decision making (see chart for details).    Patient presents today  for evaluation of difficulty urinating with feelings of incomplete bladder emptying.  He has a history of BPH and is followed by urology for this.  His postvoid residual by bladder scan while in the department was negligible.  His urine showed trace blood with microscopic exam showing few bacteria, 0-5 squamous cells, 0-5 white blood cells, and 0-5 red blood cells.  Urine culture was ordered.  Given history of BPH with difficulty urinating we will start patient on trial of Flomax with urology follow-up instructions.  This patient was seen as a shared visit with Dr. Winfred Leeds who evaluated the patient and agreed with my plan, please see his note for prostate exam.    Final Clinical Impressions(s) / ED Diagnoses   Final diagnoses:  Difficulty urinating    ED Discharge Orders         Ordered    tamsulosin (FLOMAX) 0.4 MG CAPS capsule  Daily     03/25/18 2139           Lorin Glass, PA-C 03/26/18 5176    Orlie Dakin, MD 03/26/18 1355

## 2018-03-25 NOTE — ED Triage Notes (Signed)
Presents with urinary frequency, urinating in small amounts at a time and straining to urinate. This began Friday night. Endorses dysuria. Denies fevers, chills.

## 2018-03-25 NOTE — ED Notes (Signed)
Pt understood dc material. NAD noted. Script given at dc  

## 2018-03-25 NOTE — ED Notes (Signed)
Pt ambulated to restroom without assistance. NAD noted 

## 2018-03-27 LAB — URINE CULTURE: Culture: NO GROWTH

## 2018-04-05 DIAGNOSIS — N401 Enlarged prostate with lower urinary tract symptoms: Secondary | ICD-10-CM | POA: Diagnosis not present

## 2018-04-05 DIAGNOSIS — N138 Other obstructive and reflux uropathy: Secondary | ICD-10-CM | POA: Diagnosis not present

## 2018-04-05 DIAGNOSIS — R972 Elevated prostate specific antigen [PSA]: Secondary | ICD-10-CM | POA: Diagnosis not present

## 2018-04-09 ENCOUNTER — Ambulatory Visit (INDEPENDENT_AMBULATORY_CARE_PROVIDER_SITE_OTHER): Payer: Medicare Other | Admitting: *Deleted

## 2018-04-09 DIAGNOSIS — I639 Cerebral infarction, unspecified: Secondary | ICD-10-CM

## 2018-04-09 DIAGNOSIS — I495 Sick sinus syndrome: Secondary | ICD-10-CM | POA: Diagnosis not present

## 2018-04-10 NOTE — Progress Notes (Signed)
Remote pacemaker transmission.   

## 2018-06-04 LAB — CUP PACEART REMOTE DEVICE CHECK
Battery Remaining Longevity: 163 mo
Brady Statistic AP VP Percent: 0 %
Brady Statistic AP VS Percent: 0.65 %
Brady Statistic AS VP Percent: 0.03 %
Brady Statistic AS VS Percent: 99.32 %
Brady Statistic RA Percent Paced: 0.65 %
Brady Statistic RV Percent Paced: 0.03 %
Date Time Interrogation Session: 20191028043315
Implantable Lead Implant Date: 20180126
Implantable Lead Location: 753860
Implantable Lead Model: 5076
Implantable Lead Model: 5076
Implantable Pulse Generator Implant Date: 20180126
Lead Channel Impedance Value: 323 Ohm
Lead Channel Impedance Value: 361 Ohm
Lead Channel Impedance Value: 418 Ohm
Lead Channel Impedance Value: 475 Ohm
Lead Channel Pacing Threshold Amplitude: 0.625 V
Lead Channel Sensing Intrinsic Amplitude: 2.75 mV
Lead Channel Sensing Intrinsic Amplitude: 5.25 mV
Lead Channel Sensing Intrinsic Amplitude: 5.25 mV
Lead Channel Setting Pacing Amplitude: 2 V
Lead Channel Setting Pacing Pulse Width: 0.4 ms
MDC IDC LEAD IMPLANT DT: 20180126
MDC IDC LEAD LOCATION: 753859
MDC IDC MSMT BATTERY VOLTAGE: 3.04 V
MDC IDC MSMT LEADCHNL RA PACING THRESHOLD AMPLITUDE: 0.875 V
MDC IDC MSMT LEADCHNL RA PACING THRESHOLD PULSEWIDTH: 0.4 ms
MDC IDC MSMT LEADCHNL RA SENSING INTR AMPL: 2.75 mV
MDC IDC MSMT LEADCHNL RV PACING THRESHOLD PULSEWIDTH: 0.4 ms
MDC IDC SET LEADCHNL RV PACING AMPLITUDE: 2.5 V
MDC IDC SET LEADCHNL RV SENSING SENSITIVITY: 2 mV

## 2018-06-16 ENCOUNTER — Emergency Department (HOSPITAL_BASED_OUTPATIENT_CLINIC_OR_DEPARTMENT_OTHER)
Admission: EM | Admit: 2018-06-16 | Discharge: 2018-06-16 | Disposition: A | Discharge status: Home / Self Care | Payer: Medicare Other | Attending: Emergency Medicine | Admitting: Emergency Medicine

## 2018-06-16 ENCOUNTER — Emergency Department (HOSPITAL_BASED_OUTPATIENT_CLINIC_OR_DEPARTMENT_OTHER): Payer: Medicare Other

## 2018-06-16 ENCOUNTER — Encounter (HOSPITAL_BASED_OUTPATIENT_CLINIC_OR_DEPARTMENT_OTHER): Payer: Self-pay

## 2018-06-16 ENCOUNTER — Other Ambulatory Visit: Payer: Self-pay

## 2018-06-16 DIAGNOSIS — Z79899 Other long term (current) drug therapy: Secondary | ICD-10-CM | POA: Insufficient documentation

## 2018-06-16 DIAGNOSIS — J9811 Atelectasis: Secondary | ICD-10-CM | POA: Diagnosis not present

## 2018-06-16 DIAGNOSIS — B9789 Other viral agents as the cause of diseases classified elsewhere: Secondary | ICD-10-CM

## 2018-06-16 DIAGNOSIS — I10 Essential (primary) hypertension: Secondary | ICD-10-CM | POA: Diagnosis not present

## 2018-06-16 DIAGNOSIS — Z87891 Personal history of nicotine dependence: Secondary | ICD-10-CM | POA: Diagnosis not present

## 2018-06-16 DIAGNOSIS — R05 Cough: Secondary | ICD-10-CM | POA: Diagnosis present

## 2018-06-16 DIAGNOSIS — J069 Acute upper respiratory infection, unspecified: Secondary | ICD-10-CM | POA: Diagnosis not present

## 2018-06-16 MED ORDER — BENZONATATE 100 MG PO CAPS: 100.0000 mg | ORAL_CAPSULE | Freq: Three times a day (TID) | ORAL | 0 refills | Status: DC

## 2018-06-16 MED ORDER — SALINE SPRAY 0.65 % NA SOLN: 1.0000 | NASAL | 0 refills | Status: DC | PRN

## 2018-06-16 NOTE — ED Triage Notes (Signed)
HA, fatigue, URI symptoms, cough x 2 days. Denies fever.

## 2018-06-16 NOTE — Discharge Instructions (Addendum)
Take Tessalon every 8 hours as needed for cough.  Take nasal spray as needed for congestion.  Please follow-up with your doctor if your symptoms are not improving over the next week.  Please return the emergency department if you develop any chest pain, shortness of breath, persistent fever of 100.4, or any other concerning symptoms.

## 2018-06-16 NOTE — ED Provider Notes (Signed)
[facility] EMERGENCY DEPARTMENT Provider Note   CSN: [id number] Arrival date & time: 06/16/18  1413     History   Chief Complaint No chief complaint on file.   HPI George [name] is a 78 y.o. male with history of's SBO, sick sinus syndrome with MDT dual chamber PPM who presents with a 3-day history of dry cough, nasal congestion, headache, ear fullness.  He denies any fever or chills.  He denies any chest pain, shortness of breath, sore throat, abdominal pain, nausea, vomiting.  HPI  Past Medical History:  Diagnosis Date  . Arthritis    "qwhere" (07/08/2016)  . 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 Depression   . Sick sinus syndrome ([place])    a. s/p MDT dual chamber PPM   . Small bowel obstruction [facility])     Patient Active Problem List   Diagnosis Date Noted  . Syncope 07/08/2016  . Essential hypertension 02/11/2016  . Hyperglycemia 10/04/2015  . SBO (small bowel obstruction) ([place]) 06/29/2014  . H/O hemicolectomy 06/29/2014  . BPH (benign prostatic hyperplasia) 03/07/2014  . Depression 03/07/2014  . OA (osteoarthritis) of knee 03/03/2014    Past Surgical History:  Procedure Laterality Date  . APPENDECTOMY  09/2003  . BASAL CELL CARCINOMA EXCISION     nose  . COLECTOMY Right 09/2003   Archie Endo 10/27/2010  . COLON SURGERY    . EP IMPLANTABLE DEVICE N/A 04/05/2016   Procedure: Loop Recorder Insertion;  Surgeon: Thompson Grayer, MD;  Location: [facility] CV LAB;  Service: Cardiovascular;  Laterality: N/A;  . EP IMPLANTABLE DEVICE N/A 07/08/2016   MDT Azure XT DR implanted by Dr Curt Bears for sick sinus syndrome/ sinus pauses  . EP IMPLANTABLE DEVICE N/A 07/08/2016   Procedure: Loop Recorder Removal;  Surgeon: Will Meredith Leeds, MD;  Location: [place] CV LAB;  Service: Cardiovascular;  Laterality: N/A;  . TONSILLECTOMY    . TOTAL KNEE ARTHROPLASTY Right 03/03/2014   Procedure: RIGHT TOTAL  KNEE ARTHROPLASTY;  Surgeon: Gearlean Alf, MD;  [facility];  Service: Orthopedics;  Laterality: Right;  . TOTAL KNEE ARTHROPLASTY Left 01/2005   Archie Endo 10/27/2010        Home Medications    Prior to Admission medications   Medication Sig Start Date End Date Taking? Authorizing Provider  ANDROGEL PUMP 20.25 MG/ACT (1.62%) GEL Apply 1 application topically daily.  09/08/14   [provider]  aspirin EC 81 MG tablet Take 81 mg by mouth daily.    [provider]  benzonatate (TESSALON) 100 MG capsule Take 1 capsule (100 mg total) by mouth every 8 (eight) hours. 06/16/18   Caisen Mangas, Bea Graff, PA-C  buPROPion (WELLBUTRIN XL) 150 MG 24 hr tablet Take 450 mg by mouth every morning.    [provider]  celecoxib (CELEBREX) 200 MG capsule Take 200 mg by mouth daily.    [provider]  citalopram (CELEXA) 20 MG tablet Take 1 tablet by mouth daily. 11/03/17   [provider]  doxazosin (CARDURA) 8 MG tablet Take 8 mg by mouth daily.     [provider]  finasteride (PROSCAR) 5 MG tablet Take 5 mg by mouth every evening.     [provider]  Multiple Vitamin (MULTIVITAMIN WITH MINERALS) TABS tablet Take 1 tablet by mouth daily.    [provider]  sodium chloride (OCEAN) 0.65 % SOLN nasal spray  Place 1 spray into both nostrils as needed for congestion. 06/16/18   Frederica Kuster, PA-C    Family History Family History  Problem Relation Age of Onset  . Cancer Father   . Diabetes Mellitus II Mother     Social History Social History   Tobacco Use  . Smoking status: Former Smoker    Packs/day: 2.00    Years: 20.00    Pack years: 40.00    Types: Cigarettes    Last attempt to quit: 1984    Years since quitting: 36.0  . Smokeless tobacco: Never Used  Substance Use Topics  . Alcohol use: Yes    Alcohol/week: 6.0 standard drinks    Types: 6 Cans of beer per week  . Drug use: No     Allergies   Patient has no known  allergies.   Review of Systems Review of Systems  Constitutional: Negative for chills and fever.  HENT: Positive for congestion and sinus pressure. Negative for sore throat.   Respiratory: Positive for cough. Negative for shortness of breath.   Cardiovascular: Negative for chest pain.  Gastrointestinal: Negative for abdominal pain, nausea and vomiting.     Physical Exam Updated Vital Signs BP 120/86 (BP Location: Left Arm)   Pulse 75   Temp 98.7 F (37.1 C) (Oral)   Resp 20   Ht 5\' 8"  (1.727 m)   Wt 78 kg   SpO2 100%   BMI 26.15 kg/m   Physical Exam Vitals signs and nursing note reviewed.  Constitutional:      General: He is not in acute distress.    Appearance: He is well-developed. He is not diaphoretic.  HENT:     Head: Normocephalic and atraumatic.     Right Ear: Tympanic membrane normal.     Left Ear: Tympanic membrane normal.     Ears:     Comments: Canals are narrow bilaterally, patient states this is baseline    Nose: Congestion present.     Mouth/Throat:     Pharynx: No oropharyngeal exudate.  Eyes:     General: No scleral icterus.       Right eye: No discharge.        Left eye: No discharge.     Conjunctiva/sclera: Conjunctivae normal.     Pupils: Pupils are equal, round, and reactive to light.  Neck:     Musculoskeletal: Normal range of motion and neck supple.     Thyroid: No thyromegaly.  Cardiovascular:     Rate and Rhythm: Normal rate and regular rhythm.     Heart sounds: Normal heart sounds. No murmur. No friction rub. No gallop.   Pulmonary:     Effort: Pulmonary effort is normal. No respiratory distress.     Breath sounds: Normal breath sounds. No stridor. No wheezing or rales.  Abdominal:     General: Bowel sounds are normal. There is no distension.     Palpations: Abdomen is soft.     Tenderness: There is no abdominal tenderness. There is no guarding or rebound.  Lymphadenopathy:     Cervical: No cervical adenopathy.  Skin:    General:  Skin is warm and dry.     Coloration: Skin is not pale.     Findings: No rash.  Neurological:     Mental Status: He is alert.     Coordination: Coordination normal.      ED Treatments / Results  Labs (all labs ordered are listed, but only abnormal results are  displayed) Labs Reviewed - No data to display  EKG None  Radiology Dg Chest 2 View  Result Date: 06/16/2018 CLINICAL DATA:  Cough and congestion for few days, former smoker EXAM: CHEST - 2 VIEW COMPARISON:  07/09/2016 FINDINGS: Left subclavian sequential transvenous pacemaker leads project at right atrium and right ventricle. Normal heart size, mediastinal contours, and pulmonary vascularity. Atherosclerotic calcification aorta. Bronchitic changes with mild RIGHT basilar atelectasis. Remaining lungs clear. No infiltrate, pleural effusion or pneumothorax. Bones demineralized. Probable BILATERAL chronic rotator cuff tears with scattered degenerative changes of the thoracic spine. IMPRESSION: Bronchitic changes with mild RIGHT basilar atelectasis. Electronically Signed   By: Lavonia Dana M.D.   On: 06/16/2018 16:35    Procedures Procedures (including critical care time)  Medications Ordered in ED Medications - No data to display   Initial Impression / Assessment and Plan / ED Course  I have reviewed the triage vital signs and the nursing notes.  Pertinent labs & imaging results that were available during my care of the patient were reviewed by me and considered in my medical decision making (see chart for details).     Patient presenting with URI symptoms.  Chest x-ray shows bronchitic changes with mild right basilar atelectasis.  Patient is very well-appearing.  Lungs are clear on my exam.  Patient is afebrile and well-appearing.  Suspect viral URI.  Will discharge home with Tessalon and nasal saline.  Follow-up to PCP as needed.  Strict return precautions given.  Patient understands and agrees with plan.  Patient vitals stable  throughout ED course and discharged in satisfactory condition. I discussed patient case with Dr. Johnney Killian who guided the patient's management and agrees with plan.   Final Clinical Impressions(s) / ED Diagnoses   Final diagnoses:  Viral URI with cough    ED Discharge Orders         Ordered    benzonatate (TESSALON) 100 MG capsule  Every 8 hours     06/16/18 1654    sodium chloride (OCEAN) 0.65 % SOLN nasal spray  As needed     06/16/18 1654           LawBea Graff, PA-C 06/16/18 1659    Charlesetta Shanks, MD 06/17/18 [id number]

## 2018-07-09 ENCOUNTER — Ambulatory Visit (INDEPENDENT_AMBULATORY_CARE_PROVIDER_SITE_OTHER): Payer: Medicare Other

## 2018-07-09 DIAGNOSIS — I495 Sick sinus syndrome: Secondary | ICD-10-CM | POA: Diagnosis not present

## 2018-07-10 NOTE — Progress Notes (Signed)
Remote pacemaker transmission.   

## 2018-07-12 DIAGNOSIS — N138 Other obstructive and reflux uropathy: Secondary | ICD-10-CM | POA: Diagnosis not present

## 2018-07-12 DIAGNOSIS — N401 Enlarged prostate with lower urinary tract symptoms: Secondary | ICD-10-CM | POA: Diagnosis not present

## 2018-07-12 LAB — CUP PACEART REMOTE DEVICE CHECK
Brady Statistic AP VP Percent: 0 %
Brady Statistic AP VS Percent: 0.3 %
Brady Statistic AS VP Percent: 0.03 %
Brady Statistic AS VS Percent: 99.66 %
Brady Statistic RA Percent Paced: 0.3 %
Implantable Lead Implant Date: 20180126
Implantable Lead Model: 5076
Implantable Lead Model: 5076
Lead Channel Impedance Value: 323 Ohm
Lead Channel Impedance Value: 361 Ohm
Lead Channel Impedance Value: 475 Ohm
Lead Channel Pacing Threshold Amplitude: 0.75 V
Lead Channel Pacing Threshold Pulse Width: 0.4 ms
Lead Channel Sensing Intrinsic Amplitude: 2.625 mV
Lead Channel Sensing Intrinsic Amplitude: 5.25 mV
Lead Channel Sensing Intrinsic Amplitude: 5.25 mV
Lead Channel Setting Pacing Amplitude: 2 V
Lead Channel Setting Pacing Amplitude: 2.5 V
Lead Channel Setting Pacing Pulse Width: 0.4 ms
MDC IDC LEAD IMPLANT DT: 20180126
MDC IDC LEAD LOCATION: 753859
MDC IDC LEAD LOCATION: 753860
MDC IDC MSMT BATTERY REMAINING LONGEVITY: 160 mo
MDC IDC MSMT BATTERY VOLTAGE: 3.04 V
MDC IDC MSMT LEADCHNL RA PACING THRESHOLD AMPLITUDE: 0.875 V
MDC IDC MSMT LEADCHNL RA PACING THRESHOLD PULSEWIDTH: 0.4 ms
MDC IDC MSMT LEADCHNL RA SENSING INTR AMPL: 2.625 mV
MDC IDC MSMT LEADCHNL RV IMPEDANCE VALUE: 418 Ohm
MDC IDC PG IMPLANT DT: 20180126
MDC IDC SESS DTM: 20200127050552
MDC IDC SET LEADCHNL RV SENSING SENSITIVITY: 2 mV
MDC IDC STAT BRADY RV PERCENT PACED: 0.04 %

## 2018-07-19 DIAGNOSIS — R319 Hematuria, unspecified: Secondary | ICD-10-CM | POA: Diagnosis not present

## 2018-07-19 DIAGNOSIS — N401 Enlarged prostate with lower urinary tract symptoms: Secondary | ICD-10-CM | POA: Diagnosis not present

## 2018-07-19 DIAGNOSIS — N138 Other obstructive and reflux uropathy: Secondary | ICD-10-CM | POA: Diagnosis not present

## 2018-08-14 DIAGNOSIS — J069 Acute upper respiratory infection, unspecified: Secondary | ICD-10-CM | POA: Diagnosis not present

## 2018-08-27 DIAGNOSIS — H2513 Age-related nuclear cataract, bilateral: Secondary | ICD-10-CM | POA: Diagnosis not present

## 2018-10-08 ENCOUNTER — Other Ambulatory Visit: Payer: Self-pay

## 2018-10-08 ENCOUNTER — Ambulatory Visit (INDEPENDENT_AMBULATORY_CARE_PROVIDER_SITE_OTHER): Payer: Medicare Other | Admitting: *Deleted

## 2018-10-08 DIAGNOSIS — I495 Sick sinus syndrome: Secondary | ICD-10-CM | POA: Diagnosis not present

## 2018-10-08 DIAGNOSIS — I639 Cerebral infarction, unspecified: Secondary | ICD-10-CM

## 2018-10-08 LAB — CUP PACEART REMOTE DEVICE CHECK
Battery Remaining Longevity: 157 mo
Battery Voltage: 3.04 V
Brady Statistic AP VP Percent: 0 %
Brady Statistic AP VS Percent: 0.39 %
Brady Statistic AS VP Percent: 0.03 %
Brady Statistic AS VS Percent: 99.58 %
Brady Statistic RA Percent Paced: 0.38 %
Brady Statistic RV Percent Paced: 0.03 %
Date Time Interrogation Session: 20200427074908
Implantable Lead Implant Date: 20180126
Implantable Lead Implant Date: 20180126
Implantable Lead Location: 753859
Implantable Lead Location: 753860
Implantable Lead Model: 5076
Implantable Lead Model: 5076
Implantable Pulse Generator Implant Date: 20180126
Lead Channel Impedance Value: 323 Ohm
Lead Channel Impedance Value: 361 Ohm
Lead Channel Impedance Value: 418 Ohm
Lead Channel Impedance Value: 456 Ohm
Lead Channel Pacing Threshold Amplitude: 0.75 V
Lead Channel Pacing Threshold Amplitude: 0.875 V
Lead Channel Pacing Threshold Pulse Width: 0.4 ms
Lead Channel Pacing Threshold Pulse Width: 0.4 ms
Lead Channel Sensing Intrinsic Amplitude: 2.625 mV
Lead Channel Sensing Intrinsic Amplitude: 2.625 mV
Lead Channel Sensing Intrinsic Amplitude: 4.625 mV
Lead Channel Sensing Intrinsic Amplitude: 4.625 mV
Lead Channel Setting Pacing Amplitude: 2 V
Lead Channel Setting Pacing Amplitude: 2.5 V
Lead Channel Setting Pacing Pulse Width: 0.4 ms
Lead Channel Setting Sensing Sensitivity: 2 mV

## 2018-10-17 NOTE — Progress Notes (Signed)
Remote pacemaker transmission.   

## 2018-11-08 ENCOUNTER — Telehealth: Payer: Self-pay

## 2018-11-09 ENCOUNTER — Telehealth (INDEPENDENT_AMBULATORY_CARE_PROVIDER_SITE_OTHER): Payer: Medicare Other | Admitting: Internal Medicine

## 2018-11-09 ENCOUNTER — Encounter: Payer: Self-pay | Admitting: Internal Medicine

## 2018-11-09 DIAGNOSIS — I495 Sick sinus syndrome: Secondary | ICD-10-CM | POA: Diagnosis not present

## 2018-11-09 DIAGNOSIS — I639 Cerebral infarction, unspecified: Secondary | ICD-10-CM

## 2018-11-09 NOTE — Progress Notes (Signed)
Electrophysiology TeleHealth Note   Due to national recommendations of social distancing due to [place] 19, an audio telehealth visit is felt to be most appropriate for this patient at this time.  See MyChart message from today for the patient's consent to telehealth for [facility].  Virtual visit was attempted but not successful.  We therefore did a phone call today.   Date:  11/09/2018   ID:  George [name], DOB 24-Oct-1940, MRN [id number]  Location: patient's home  Provider location: [address] [place]  Evaluation Performed: Follow-up visit  PCP:  [name], MD   Electrophysiologist:  Dr Rayann Heman  Chief Complaint:  Pacemaker follow-up  History of Present Illness:    George [name] is a 78 y.o. male who presents via audio conferencing for a telehealth visit today.  Since last being seen in our clinic, the patient reports doing very well.  Today, he denies symptoms of palpitations, chest pain, shortness of breath,  lower extremity edema, dizziness, presyncope, or syncope.  The patient is otherwise without complaint today.  The patient denies symptoms of fevers, chills, cough, or new SOB worrisome for COVID 19.  Past Medical History:  Diagnosis Date  . Arthritis    "qwhere" (07/08/2016)  . 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 Depression   . Sick sinus syndrome ([place])    a. s/p MDT dual chamber PPM   . Small bowel obstruction ([place])     Past Surgical History:  Procedure Laterality Date  . APPENDECTOMY  09/2003  . BASAL CELL CARCINOMA EXCISION     nose  . COLECTOMY Right 09/2003   Archie Endo 10/27/2010  . COLON SURGERY    . EP IMPLANTABLE DEVICE N/A 04/05/2016   Procedure: Loop Recorder Insertion;  Surgeon: Thompson Grayer, MD;  Location: [facility];  Service: Cardiovascular;  Laterality: N/A;  . EP IMPLANTABLE DEVICE N/A 07/08/2016   MDT Azure XT DR implanted by Dr Curt Bears  for sick sinus syndrome/ sinus pauses  . EP IMPLANTABLE DEVICE N/A 07/08/2016   Procedure: Loop Recorder Removal;  Surgeon: Will Meredith Leeds, MD;  Location: [place] [facility];  Service: Cardiovascular;  Laterality: N/A;  . TONSILLECTOMY    . TOTAL KNEE ARTHROPLASTY Right 03/03/2014   Procedure: RIGHT TOTAL KNEE ARTHROPLASTY;  Surgeon: Gearlean Alf, MD;  [facility];  Service: Orthopedics;  Laterality: Right;  . TOTAL KNEE ARTHROPLASTY Left 01/2005   Archie Endo 10/27/2010    Current Outpatient Medications  Medication Sig Dispense Refill  . ANDROGEL PUMP 20.25 MG/ACT (1.62%) GEL Apply 1 application topically daily.     Marland Kitchen aspirin EC 81 MG tablet Take 81 mg by mouth daily.    Marland Kitchen buPROPion (WELLBUTRIN XL) 150 MG 24 hr tablet Take 450 mg by mouth every morning.    . celecoxib (CELEBREX) 200 MG capsule Take 200 mg by mouth daily.    . citalopram (CELEXA) 20 MG tablet Take 1 tablet by mouth daily.    . finasteride (PROSCAR) 5 MG tablet Take 5 mg by mouth every evening.     . Multiple Vitamin (MULTIVITAMIN WITH MINERALS) TABS tablet Take 1 tablet by mouth daily.    . propranolol (INDERAL) 20 MG tablet Take 20 mg by mouth daily.    . sodium chloride (OCEAN) 0.65 % SOLN nasal spray Place 1 spray into both nostrils as needed for congestion. 15 mL  0  . tamsulosin (FLOMAX) 0.4 MG CAPS capsule Take 0.4 mg by mouth daily.     No current facility-administered medications for this visit.     Allergies:   Patient has no known allergies.   Social History:  The patient  reports that he quit smoking about 36 years ago. His smoking use included cigarettes. He has a 40.00 pack-year smoking history. He has never used smokeless tobacco. He reports current alcohol use of about 6.0 standard drinks of alcohol per week. He reports that he does not use drugs.   Family History:  The patient's  family history includes Cancer in his father; Diabetes Mellitus II in his mother.   ROS:  Please see the history of  present illness.   All other systems are personally reviewed and negative.    Exam:    Vital Signs:  There were no vitals taken for this visit.  Well sounding   Labs/Other Tests and Data Reviewed:    Recent Labs: No results found for requested labs within last 8760 hours.   Wt Readings from Last 3 Encounters:  06/16/18 172 lb (78 kg)  03/25/18 172 lb (78 kg)  01/11/18 176 lb (79.8 kg)     Other studies personally reviewed: Additional studies/ records that were reviewed today include: EP NPs prior notes  Review of the above records today demonstrates: as above   Last device remote is reviewed from [place] PDF dated 10/08/2018 which reveals normal device function, no arrhythmias    ASSESSMENT & PLAN:    1.  Sick sinus syndrome Remotes are uptodate Normal device function  2. COVID 19 screen The patient denies symptoms of COVID 19 at this time.  The importance of social distancing was discussed today.  Follow-up:  12 months with EP NP Next remote: 12/2018  Current medicines are reviewed at length with the patient today.   The patient does not have concerns regarding his medicines.  The following changes were made today:  none  Labs/ tests ordered today include:  No orders of the defined types were placed in this encounter.  Patient Risk:  after full review of this patients clinical status, I feel that they are at moderate risk at this time.  Today, I have spent 15 minutes with the patient with telehealth technology discussing pacemaker follow-up .    SignedThompson Grayer, MD  11/09/2018 3:17 PM     [address] [place] [address] [phone] (office) [phone] (fax)

## 2018-12-06 DIAGNOSIS — R972 Elevated prostate specific antigen [PSA]: Secondary | ICD-10-CM | POA: Diagnosis not present

## 2018-12-12 DIAGNOSIS — N138 Other obstructive and reflux uropathy: Secondary | ICD-10-CM | POA: Diagnosis not present

## 2018-12-12 DIAGNOSIS — N401 Enlarged prostate with lower urinary tract symptoms: Secondary | ICD-10-CM | POA: Diagnosis not present

## 2019-01-07 ENCOUNTER — Ambulatory Visit (INDEPENDENT_AMBULATORY_CARE_PROVIDER_SITE_OTHER): Payer: Medicare Other | Admitting: *Deleted

## 2019-01-07 DIAGNOSIS — I495 Sick sinus syndrome: Secondary | ICD-10-CM

## 2019-01-07 LAB — CUP PACEART REMOTE DEVICE CHECK
Battery Remaining Longevity: 154 mo
Battery Voltage: 3.03 V
Brady Statistic AP VP Percent: 0.01 %
Brady Statistic AP VS Percent: 1.16 %
Brady Statistic AS VP Percent: 0.03 %
Brady Statistic AS VS Percent: 98.8 %
Brady Statistic RA Percent Paced: 1.11 %
Brady Statistic RV Percent Paced: 0.03 %
Date Time Interrogation Session: 20200727062810
Implantable Lead Implant Date: 20180126
Implantable Lead Implant Date: 20180126
Implantable Lead Location: 753859
Implantable Lead Location: 753860
Implantable Lead Model: 5076
Implantable Lead Model: 5076
Implantable Pulse Generator Implant Date: 20180126
Lead Channel Impedance Value: 323 Ohm
Lead Channel Impedance Value: 342 Ohm
Lead Channel Impedance Value: 399 Ohm
Lead Channel Impedance Value: 456 Ohm
Lead Channel Pacing Threshold Amplitude: 0.625 V
Lead Channel Pacing Threshold Amplitude: 0.875 V
Lead Channel Pacing Threshold Pulse Width: 0.4 ms
Lead Channel Pacing Threshold Pulse Width: 0.4 ms
Lead Channel Sensing Intrinsic Amplitude: 2.625 mV
Lead Channel Sensing Intrinsic Amplitude: 2.625 mV
Lead Channel Sensing Intrinsic Amplitude: 4.625 mV
Lead Channel Sensing Intrinsic Amplitude: 4.625 mV
Lead Channel Setting Pacing Amplitude: 2 V
Lead Channel Setting Pacing Amplitude: 2.5 V
Lead Channel Setting Pacing Pulse Width: 0.4 ms
Lead Channel Setting Sensing Sensitivity: 2 mV

## 2019-01-25 ENCOUNTER — Encounter: Payer: Self-pay | Admitting: Cardiology

## 2019-01-25 NOTE — Progress Notes (Signed)
Remote pacemaker transmission.   

## 2019-03-11 DIAGNOSIS — Z23 Encounter for immunization: Secondary | ICD-10-CM | POA: Diagnosis not present

## 2019-04-09 ENCOUNTER — Ambulatory Visit (INDEPENDENT_AMBULATORY_CARE_PROVIDER_SITE_OTHER): Payer: Medicare Other | Admitting: *Deleted

## 2019-04-09 DIAGNOSIS — I459 Conduction disorder, unspecified: Secondary | ICD-10-CM | POA: Diagnosis not present

## 2019-04-12 LAB — CUP PACEART REMOTE DEVICE CHECK
Battery Remaining Longevity: 151 mo
Battery Voltage: 3.03 V
Brady Statistic AP VP Percent: 0.01 %
Brady Statistic AP VS Percent: 1.17 %
Brady Statistic AS VP Percent: 0.03 %
Brady Statistic AS VS Percent: 98.8 %
Brady Statistic RA Percent Paced: 1.15 %
Brady Statistic RV Percent Paced: 0.04 %
Date Time Interrogation Session: 20201030171951
Implantable Lead Implant Date: 20180126
Implantable Lead Implant Date: 20180126
Implantable Lead Location: 753859
Implantable Lead Location: 753860
Implantable Lead Model: 5076
Implantable Lead Model: 5076
Implantable Pulse Generator Implant Date: 20180126
Lead Channel Impedance Value: 323 Ohm
Lead Channel Impedance Value: 361 Ohm
Lead Channel Impedance Value: 418 Ohm
Lead Channel Impedance Value: 456 Ohm
Lead Channel Pacing Threshold Amplitude: 0.625 V
Lead Channel Pacing Threshold Amplitude: 0.875 V
Lead Channel Pacing Threshold Pulse Width: 0.4 ms
Lead Channel Pacing Threshold Pulse Width: 0.4 ms
Lead Channel Sensing Intrinsic Amplitude: 2.5 mV
Lead Channel Sensing Intrinsic Amplitude: 2.5 mV
Lead Channel Sensing Intrinsic Amplitude: 5.75 mV
Lead Channel Sensing Intrinsic Amplitude: 5.75 mV
Lead Channel Setting Pacing Amplitude: 2 V
Lead Channel Setting Pacing Amplitude: 2.5 V
Lead Channel Setting Pacing Pulse Width: 0.4 ms
Lead Channel Setting Sensing Sensitivity: 2 mV

## 2019-05-01 NOTE — Progress Notes (Signed)
Remote pacemaker transmission.   

## 2019-06-19 DIAGNOSIS — N401 Enlarged prostate with lower urinary tract symptoms: Secondary | ICD-10-CM | POA: Diagnosis not present

## 2019-06-19 DIAGNOSIS — N138 Other obstructive and reflux uropathy: Secondary | ICD-10-CM | POA: Diagnosis not present

## 2019-06-25 DIAGNOSIS — N401 Enlarged prostate with lower urinary tract symptoms: Secondary | ICD-10-CM | POA: Diagnosis not present

## 2019-06-25 DIAGNOSIS — R972 Elevated prostate specific antigen [PSA]: Secondary | ICD-10-CM | POA: Diagnosis not present

## 2019-06-25 DIAGNOSIS — N486 Induration penis plastica: Secondary | ICD-10-CM | POA: Diagnosis not present

## 2019-06-25 DIAGNOSIS — N5201 Erectile dysfunction due to arterial insufficiency: Secondary | ICD-10-CM | POA: Diagnosis not present

## 2019-06-25 DIAGNOSIS — N138 Other obstructive and reflux uropathy: Secondary | ICD-10-CM | POA: Diagnosis not present

## 2019-07-03 ENCOUNTER — Other Ambulatory Visit: Payer: Self-pay | Admitting: Radiology

## 2019-07-03 DIAGNOSIS — N486 Induration penis plastica: Secondary | ICD-10-CM

## 2019-07-03 DIAGNOSIS — R972 Elevated prostate specific antigen [PSA]: Secondary | ICD-10-CM

## 2019-07-03 DIAGNOSIS — N529 Male erectile dysfunction, unspecified: Secondary | ICD-10-CM

## 2019-07-03 DIAGNOSIS — F329 Major depressive disorder, single episode, unspecified: Secondary | ICD-10-CM | POA: Diagnosis not present

## 2019-07-03 DIAGNOSIS — M170 Bilateral primary osteoarthritis of knee: Secondary | ICD-10-CM | POA: Diagnosis not present

## 2019-07-03 DIAGNOSIS — N138 Other obstructive and reflux uropathy: Secondary | ICD-10-CM

## 2019-07-03 DIAGNOSIS — R251 Tremor, unspecified: Secondary | ICD-10-CM | POA: Diagnosis not present

## 2019-07-05 DIAGNOSIS — Z23 Encounter for immunization: Secondary | ICD-10-CM | POA: Diagnosis not present

## 2019-07-09 ENCOUNTER — Ambulatory Visit (INDEPENDENT_AMBULATORY_CARE_PROVIDER_SITE_OTHER): Payer: Medicare Other | Admitting: *Deleted

## 2019-07-09 DIAGNOSIS — I459 Conduction disorder, unspecified: Secondary | ICD-10-CM | POA: Diagnosis not present

## 2019-07-09 DIAGNOSIS — F329 Major depressive disorder, single episode, unspecified: Secondary | ICD-10-CM | POA: Diagnosis not present

## 2019-07-10 LAB — CUP PACEART REMOTE DEVICE CHECK
Battery Remaining Longevity: 148 mo
Battery Voltage: 3.03 V
Brady Statistic AP VP Percent: 0.01 %
Brady Statistic AP VS Percent: 0.52 %
Brady Statistic AS VP Percent: 0.03 %
Brady Statistic AS VS Percent: 99.44 %
Brady Statistic RA Percent Paced: 0.52 %
Brady Statistic RV Percent Paced: 0.03 %
Date Time Interrogation Session: 20210126005515
Implantable Lead Implant Date: 20180126
Implantable Lead Implant Date: 20180126
Implantable Lead Location: 753859
Implantable Lead Location: 753860
Implantable Lead Model: 5076
Implantable Lead Model: 5076
Implantable Pulse Generator Implant Date: 20180126
Lead Channel Impedance Value: 304 Ohm
Lead Channel Impedance Value: 342 Ohm
Lead Channel Impedance Value: 399 Ohm
Lead Channel Impedance Value: 437 Ohm
Lead Channel Pacing Threshold Amplitude: 0.625 V
Lead Channel Pacing Threshold Amplitude: 0.875 V
Lead Channel Pacing Threshold Pulse Width: 0.4 ms
Lead Channel Pacing Threshold Pulse Width: 0.4 ms
Lead Channel Sensing Intrinsic Amplitude: 2.625 mV
Lead Channel Sensing Intrinsic Amplitude: 2.625 mV
Lead Channel Sensing Intrinsic Amplitude: 4 mV
Lead Channel Sensing Intrinsic Amplitude: 4 mV
Lead Channel Setting Pacing Amplitude: 2 V
Lead Channel Setting Pacing Amplitude: 2.5 V
Lead Channel Setting Pacing Pulse Width: 0.4 ms
Lead Channel Setting Sensing Sensitivity: 2 mV

## 2019-07-23 DIAGNOSIS — H532 Diplopia: Secondary | ICD-10-CM | POA: Insufficient documentation

## 2019-07-26 DIAGNOSIS — Z23 Encounter for immunization: Secondary | ICD-10-CM | POA: Diagnosis not present

## 2019-08-22 DIAGNOSIS — J3489 Other specified disorders of nose and nasal sinuses: Secondary | ICD-10-CM | POA: Diagnosis not present

## 2019-10-08 ENCOUNTER — Ambulatory Visit (INDEPENDENT_AMBULATORY_CARE_PROVIDER_SITE_OTHER): Payer: Medicare Other | Admitting: *Deleted

## 2019-10-08 DIAGNOSIS — I459 Conduction disorder, unspecified: Secondary | ICD-10-CM

## 2019-10-08 LAB — CUP PACEART REMOTE DEVICE CHECK
Battery Remaining Longevity: 145 mo
Battery Voltage: 3.03 V
Brady Statistic AP VP Percent: 0.01 %
Brady Statistic AP VS Percent: 0.6 %
Brady Statistic AS VP Percent: 0.03 %
Brady Statistic AS VS Percent: 99.36 %
Brady Statistic RA Percent Paced: 0.59 %
Brady Statistic RV Percent Paced: 0.04 %
Date Time Interrogation Session: 20210427015239
Implantable Lead Implant Date: 20180126
Implantable Lead Implant Date: 20180126
Implantable Lead Location: 753859
Implantable Lead Location: 753860
Implantable Lead Model: 5076
Implantable Lead Model: 5076
Implantable Pulse Generator Implant Date: 20180126
Lead Channel Impedance Value: 304 Ohm
Lead Channel Impedance Value: 342 Ohm
Lead Channel Impedance Value: 399 Ohm
Lead Channel Impedance Value: 456 Ohm
Lead Channel Pacing Threshold Amplitude: 0.625 V
Lead Channel Pacing Threshold Amplitude: 0.75 V
Lead Channel Pacing Threshold Pulse Width: 0.4 ms
Lead Channel Pacing Threshold Pulse Width: 0.4 ms
Lead Channel Sensing Intrinsic Amplitude: 2.375 mV
Lead Channel Sensing Intrinsic Amplitude: 2.375 mV
Lead Channel Sensing Intrinsic Amplitude: 5.375 mV
Lead Channel Sensing Intrinsic Amplitude: 5.375 mV
Lead Channel Setting Pacing Amplitude: 2 V
Lead Channel Setting Pacing Amplitude: 2.5 V
Lead Channel Setting Pacing Pulse Width: 0.4 ms
Lead Channel Setting Sensing Sensitivity: 2 mV

## 2019-10-09 NOTE — Progress Notes (Signed)
PPM Remote  

## 2019-12-13 ENCOUNTER — Ambulatory Visit (INDEPENDENT_AMBULATORY_CARE_PROVIDER_SITE_OTHER): Payer: Medicare Other | Admitting: Student

## 2019-12-13 ENCOUNTER — Encounter: Payer: Self-pay | Admitting: Student

## 2019-12-13 ENCOUNTER — Other Ambulatory Visit: Payer: Self-pay

## 2019-12-13 VITALS — BP 124/72 | HR 67 | Ht 68.0 in | Wt 171.0 lb

## 2019-12-13 DIAGNOSIS — I495 Sick sinus syndrome: Secondary | ICD-10-CM

## 2019-12-13 LAB — CUP PACEART INCLINIC DEVICE CHECK
Battery Remaining Longevity: 142 mo
Battery Voltage: 3.02 V
Brady Statistic AP VP Percent: 0.01 %
Brady Statistic AP VS Percent: 0.67 %
Brady Statistic AS VP Percent: 0.03 %
Brady Statistic AS VS Percent: 99.3 %
Brady Statistic RA Percent Paced: 0.66 %
Brady Statistic RV Percent Paced: 0.03 %
Date Time Interrogation Session: 20210702113017
Implantable Lead Implant Date: 20180126
Implantable Lead Implant Date: 20180126
Implantable Lead Location: 753859
Implantable Lead Location: 753860
Implantable Lead Model: 5076
Implantable Lead Model: 5076
Implantable Pulse Generator Implant Date: 20180126
Lead Channel Impedance Value: 323 Ohm
Lead Channel Impedance Value: 342 Ohm
Lead Channel Impedance Value: 399 Ohm
Lead Channel Impedance Value: 475 Ohm
Lead Channel Pacing Threshold Amplitude: 0.75 V
Lead Channel Pacing Threshold Amplitude: 0.75 V
Lead Channel Pacing Threshold Pulse Width: 0.4 ms
Lead Channel Pacing Threshold Pulse Width: 0.4 ms
Lead Channel Sensing Intrinsic Amplitude: 2.375 mV
Lead Channel Sensing Intrinsic Amplitude: 2.5 mV
Lead Channel Sensing Intrinsic Amplitude: 4.375 mV
Lead Channel Sensing Intrinsic Amplitude: 4.75 mV
Lead Channel Setting Pacing Amplitude: 2 V
Lead Channel Setting Pacing Amplitude: 2.5 V
Lead Channel Setting Pacing Pulse Width: 0.4 ms
Lead Channel Setting Sensing Sensitivity: 2 mV

## 2019-12-13 NOTE — Progress Notes (Signed)
Electrophysiology Office Note Date: 12/13/2019  ID:  George, [name] 1941-02-18, MRN [id number]  PCP: [name], MD Primary Cardiologist: No primary care provider on file. Electrophysiologist: Thompson Grayer, MD   CC: Pacemaker follow-up  George [name] is a 79 y.o. male seen today for Thompson Grayer, MD for routine electrophysiology followup.  Since last being seen in our clinic the patient reports doing very well. He got married in April and is planning a honeymoon for this summer.  he denies chest pain, palpitations, dyspnea, PND, orthopnea, nausea, vomiting, dizziness, syncope, edema, weight gain, or early satiety.  Device History: Medtronic Dual Chamber PPM implanted 06/2016 for sinus pauses and syncope  Past Medical History:  Diagnosis Date  . Arthritis    "qwhere" (07/08/2016)  . 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 Depression   . Sick sinus syndrome ([place])    a. s/p MDT dual chamber PPM   . Small bowel obstruction ([place])    Past Surgical History:  Procedure Laterality Date  . APPENDECTOMY  09/2003  . BASAL CELL CARCINOMA EXCISION     nose  . COLECTOMY Right 09/2003   Archie Endo 10/27/2010  . COLON SURGERY    . EP IMPLANTABLE DEVICE N/A 04/05/2016   Procedure: Loop Recorder Insertion;  Surgeon: Thompson Grayer, MD;  Location: [facility];  Service: Cardiovascular;  Laterality: N/A;  . EP IMPLANTABLE DEVICE N/A 07/08/2016   MDT Azure XT DR implanted by Dr Curt Bears for sick sinus syndrome/ sinus pauses  . EP IMPLANTABLE DEVICE N/A 07/08/2016   Procedure: Loop Recorder Removal;  Surgeon: Will Meredith Leeds, MD;  Location: [facility];  Service: Cardiovascular;  Laterality: N/A;  . TONSILLECTOMY    . TOTAL KNEE ARTHROPLASTY Right 03/03/2014   Procedure: RIGHT TOTAL KNEE ARTHROPLASTY;  Surgeon: Gearlean Alf, MD;  [facility];  Service: Orthopedics;  Laterality: Right;  . TOTAL  KNEE ARTHROPLASTY Left 01/2005   Archie Endo 10/27/2010    Current Outpatient Medications  Medication Sig Dispense Refill  . ANDROGEL PUMP 20.25 MG/ACT (1.62%) GEL Apply 1 application topically daily.     Marland Kitchen aspirin EC 81 MG tablet Take 81 mg by mouth daily.    Marland Kitchen buPROPion (WELLBUTRIN XL) 150 MG 24 hr tablet Take 450 mg by mouth every morning.    . celecoxib (CELEBREX) 200 MG capsule Take 200 mg by mouth daily.    . citalopram (CELEXA) 20 MG tablet Take 1 tablet by mouth daily.    . finasteride (PROSCAR) 5 MG tablet Take 5 mg by mouth every evening.     . Multiple Vitamin (MULTIVITAMIN WITH MINERALS) TABS tablet Take 1 tablet by mouth daily.    . propranolol (INDERAL) 20 MG tablet Take 20 mg by mouth daily.    . sodium chloride (OCEAN) 0.65 % SOLN nasal spray Place 1 spray into both nostrils as needed for congestion. 15 mL 0  . tadalafil (CIALIS) 5 MG tablet Take by mouth as needed.    . tamsulosin (FLOMAX) 0.4 MG CAPS capsule Take 0.4 mg by mouth daily.     No current facility-administered medications for this visit.    Allergies:   Patient has no known allergies.   Social History: Social History   Socioeconomic History  . Marital status: Widowed    Spouse name: Not on file  . Number of children: Not on file  . Years  of education: Not on file  . Highest education level: Not on file  Occupational History  . Not on file  Tobacco Use  . Smoking status: Former Smoker    Packs/day: 2.00    Years: 20.00    Pack years: 40.00    Types: Cigarettes    Quit date: 1984    Years since quitting: 37.5  . Smokeless tobacco: Never Used  Vaping Use  . Vaping Use: Never used  Substance and Sexual Activity  . Alcohol use: Yes    Alcohol/week: 6.0 standard drinks    Types: 6 Cans of beer per week  . Drug use: No  . Sexual activity: Not on file  Other Topics Concern  . Not on file  Social History Narrative  . Not on file   Social Determinants of Health   Financial Resource Strain:   .  Difficulty of Paying Living Expenses:   Food Insecurity:   . Worried About Charity fundraiser in the Last Year:   . Arboriculturist in the Last Year:   Transportation Needs:   . Film/video editor (Medical):   Marland Kitchen Lack of Transportation (Non-Medical):   Physical Activity:   . Days of Exercise per Week:   . Minutes of Exercise per Session:   Stress:   . Feeling of Stress :   Social Connections:   . Frequency of Communication with Friends and Family:   . Frequency of Social Gatherings with Friends and Family:   . Attends Religious Services:   . Active Member of Clubs or Organizations:   . Attends Archivist Meetings:   Marland Kitchen Marital Status:   Intimate Partner Violence:   . Fear of Current or Ex-Partner:   . Emotionally Abused:   Marland Kitchen Physically Abused:   . Sexually Abused:     Family History: Family History  Problem Relation Age of Onset  . Cancer Father   . Diabetes Mellitus II Mother      Review of Systems: All other systems reviewed and are otherwise negative except as noted above.  Physical Exam: Vitals:   12/13/19 1055  BP: 124/72  Pulse: 67  SpO2: 96%  Weight: 171 lb (77.6 kg)  Height: 5\' 8"  (1.727 m)     GEN- The patient is well appearing, alert and oriented x 3 today.   HEENT: normocephalic, atraumatic; sclera clear, conjunctiva pink; hearing intact; oropharynx clear; neck supple  Lungs- Clear to ausculation bilaterally, normal work of breathing.  No wheezes, rales, rhonchi Heart- Regular rate and rhythm, no murmurs, rubs or gallops  GI- soft, non-tender, non-distended, bowel sounds present  Extremities- no clubbing, cyanosis, or edema  MS- no significant deformity or atrophy Skin- warm and dry, no rash or lesion; PPM pocket well healed Psych- euthymic mood, full affect Neuro- strength and sensation are intact  PPM Interrogation- reviewed in detail today,  See PACEART report  EKG:  EKG is ordered today. The ekg ordered today shows NSR at 67  bpm, PR interval 190 ms, QRS 122 ms in LBBB pattern  Recent Labs: No results found for requested labs within last 8760 hours.   Wt Readings from Last 3 Encounters:  12/13/19 171 lb (77.6 kg)  06/16/18 172 lb (78 kg)  03/25/18 172 lb (78 kg)     Other studies Reviewed: Additional studies/ records that were reviewed today include: Previous EP office notes, Previous remote checks, Most recent labwork.   Assessment and Plan:  1. Sick sinus syndrome  s/p Medtronic PPM  Normal PPM function See Claudia Desanctis Art report No changes today Doing very well. No further syncope s/p PPM  Current medicines are reviewed at length with the patient today.   The patient does not have concerns regarding his medicines.  The following changes were made today:  none  Labs/ tests ordered today include:  Orders Placed This Encounter  Procedures  . EKG 12-Lead     Disposition:   Follow up with Dr. Rayann Heman or EP APP in 12 Months    Signed, Annamaria Helling  12/13/2019 11:29 AM  [facility] [address] [place] [address] [phone] (office) [phone] (fax)

## 2019-12-13 NOTE — Patient Instructions (Addendum)
Medication Instructions:  *If you need a refill on your cardiac medications before your next appointment, please call your pharmacy*  Lab Work: If you have labs (blood work) drawn today and your tests are completely normal, you will receive your results only by: Marland Kitchen MyChart Message (if you have MyChart) OR . A paper copy in the mail If you have any lab test that is abnormal or we need to change your treatment, we will call you to review the results.  Follow-Up: At [facility], you and your health needs are our priority.  As part of our continuing mission to provide you with exceptional heart care, we have created designated Provider Care Teams.  These Care Teams include your primary Cardiologist (physician) and Advanced Practice Providers (APPs -  Physician Assistants and Nurse Practitioners) who all work together to provide you with the care you need, when you need it.  We recommend signing up for the patient portal called "MyChart".  Sign up information is provided on this After Visit Summary.  MyChart is used to connect with patients for Virtual Visits (Telemedicine).  Patients are able to view lab/test results, encounter notes, upcoming appointments, etc.  Non-urgent messages can be sent to your provider as well.   To learn more about what you can do with MyChart, go to [email or web address].    Your next appointment:   Your physician wants you to follow-up in: 1 YEAR with Dr. Rayann Heman. You will receive a reminder letter in the mail two months in advance. If you don't receive a letter, please call our office to schedule the follow-up appointment.  Remote monitoring is used to monitor your Pacemaker of ICD from home. This monitoring reduces the number of office visits required to check your device to one time per year. It allows [place] to keep an eye on the functioning of your device to ensure it is working properly. You are scheduled for a device check from home on 01/02/20. You may send your  transmission at any time that day. If you have a wireless device, the transmission will be sent automatically. After your physician reviews your transmission, you will receive a postcard with your next transmission date  The format for your next appointment:   In Person  Provider:   You may see Dr. Rayann Heman or one of the following Advanced Practice Providers on your designated Care Team:    Chanetta Marshall, NP  Tommye Standard, PA-C  Legrand Como "[place]" [place], [place]

## 2019-12-30 DIAGNOSIS — H0011 Chalazion right upper eyelid: Secondary | ICD-10-CM | POA: Diagnosis not present

## 2020-01-07 ENCOUNTER — Ambulatory Visit (INDEPENDENT_AMBULATORY_CARE_PROVIDER_SITE_OTHER): Payer: Medicare Other | Admitting: *Deleted

## 2020-01-07 DIAGNOSIS — I459 Conduction disorder, unspecified: Secondary | ICD-10-CM | POA: Diagnosis not present

## 2020-01-08 LAB — CUP PACEART REMOTE DEVICE CHECK
Battery Remaining Longevity: 142 mo
Battery Voltage: 3.02 V
Brady Statistic AP VP Percent: 0 %
Brady Statistic AP VS Percent: 0.71 %
Brady Statistic AS VP Percent: 0.03 %
Brady Statistic AS VS Percent: 99.26 %
Brady Statistic RA Percent Paced: 0.7 %
Brady Statistic RV Percent Paced: 0.03 %
Date Time Interrogation Session: 20210727020341
Implantable Lead Implant Date: 20180126
Implantable Lead Implant Date: 20180126
Implantable Lead Location: 753859
Implantable Lead Location: 753860
Implantable Lead Model: 5076
Implantable Lead Model: 5076
Implantable Pulse Generator Implant Date: 20180126
Lead Channel Impedance Value: 323 Ohm
Lead Channel Impedance Value: 342 Ohm
Lead Channel Impedance Value: 399 Ohm
Lead Channel Impedance Value: 437 Ohm
Lead Channel Pacing Threshold Amplitude: 0.75 V
Lead Channel Pacing Threshold Amplitude: 0.875 V
Lead Channel Pacing Threshold Pulse Width: 0.4 ms
Lead Channel Pacing Threshold Pulse Width: 0.4 ms
Lead Channel Sensing Intrinsic Amplitude: 2.5 mV
Lead Channel Sensing Intrinsic Amplitude: 2.5 mV
Lead Channel Sensing Intrinsic Amplitude: 5.125 mV
Lead Channel Sensing Intrinsic Amplitude: 5.125 mV
Lead Channel Setting Pacing Amplitude: 2 V
Lead Channel Setting Pacing Amplitude: 2.5 V
Lead Channel Setting Pacing Pulse Width: 0.4 ms
Lead Channel Setting Sensing Sensitivity: 2 mV

## 2020-01-13 NOTE — Progress Notes (Signed)
Remote pacemaker transmission.   

## 2020-01-30 DIAGNOSIS — R972 Elevated prostate specific antigen [PSA]: Secondary | ICD-10-CM | POA: Diagnosis not present

## 2020-02-06 DIAGNOSIS — R972 Elevated prostate specific antigen [PSA]: Secondary | ICD-10-CM | POA: Diagnosis not present

## 2020-02-06 DIAGNOSIS — E291 Testicular hypofunction: Secondary | ICD-10-CM | POA: Diagnosis not present

## 2020-02-06 DIAGNOSIS — N401 Enlarged prostate with lower urinary tract symptoms: Secondary | ICD-10-CM | POA: Diagnosis not present

## 2020-02-06 DIAGNOSIS — N5201 Erectile dysfunction due to arterial insufficiency: Secondary | ICD-10-CM | POA: Diagnosis not present

## 2020-02-06 DIAGNOSIS — N138 Other obstructive and reflux uropathy: Secondary | ICD-10-CM | POA: Diagnosis not present

## 2020-02-25 DIAGNOSIS — D1801 Hemangioma of skin and subcutaneous tissue: Secondary | ICD-10-CM | POA: Diagnosis not present

## 2020-02-25 DIAGNOSIS — E882 Lipomatosis, not elsewhere classified: Secondary | ICD-10-CM | POA: Diagnosis not present

## 2020-02-25 DIAGNOSIS — L821 Other seborrheic keratosis: Secondary | ICD-10-CM | POA: Diagnosis not present

## 2020-02-25 DIAGNOSIS — L578 Other skin changes due to chronic exposure to nonionizing radiation: Secondary | ICD-10-CM | POA: Diagnosis not present

## 2020-02-25 DIAGNOSIS — L905 Scar conditions and fibrosis of skin: Secondary | ICD-10-CM | POA: Diagnosis not present

## 2020-02-25 DIAGNOSIS — Z85828 Personal history of other malignant neoplasm of skin: Secondary | ICD-10-CM | POA: Diagnosis not present

## 2020-03-23 DIAGNOSIS — R059 Cough, unspecified: Secondary | ICD-10-CM | POA: Diagnosis not present

## 2020-03-24 DIAGNOSIS — Z03818 Encounter for observation for suspected exposure to other biological agents ruled out: Secondary | ICD-10-CM | POA: Diagnosis not present

## 2020-03-24 DIAGNOSIS — R059 Cough, unspecified: Secondary | ICD-10-CM | POA: Diagnosis not present

## 2020-03-27 ENCOUNTER — Emergency Department (HOSPITAL_BASED_OUTPATIENT_CLINIC_OR_DEPARTMENT_OTHER)
Admission: EM | Admit: 2020-03-27 | Discharge: 2020-03-27 | Disposition: A | Discharge status: Home / Self Care | Payer: Medicare Other | Attending: Emergency Medicine | Admitting: Emergency Medicine

## 2020-03-27 ENCOUNTER — Emergency Department (HOSPITAL_BASED_OUTPATIENT_CLINIC_OR_DEPARTMENT_OTHER): Payer: Medicare Other

## 2020-03-27 ENCOUNTER — Other Ambulatory Visit: Payer: Self-pay

## 2020-03-27 DIAGNOSIS — J069 Acute upper respiratory infection, unspecified: Secondary | ICD-10-CM | POA: Diagnosis not present

## 2020-03-27 DIAGNOSIS — R059 Cough, unspecified: Secondary | ICD-10-CM | POA: Diagnosis not present

## 2020-03-27 DIAGNOSIS — Z79899 Other long term (current) drug therapy: Secondary | ICD-10-CM | POA: Insufficient documentation

## 2020-03-27 DIAGNOSIS — Z85828 Personal history of other malignant neoplasm of skin: Secondary | ICD-10-CM | POA: Insufficient documentation

## 2020-03-27 DIAGNOSIS — Z96653 Presence of artificial knee joint, bilateral: Secondary | ICD-10-CM | POA: Diagnosis not present

## 2020-03-27 DIAGNOSIS — Z87891 Personal history of nicotine dependence: Secondary | ICD-10-CM | POA: Diagnosis not present

## 2020-03-27 DIAGNOSIS — I10 Essential (primary) hypertension: Secondary | ICD-10-CM | POA: Insufficient documentation

## 2020-03-27 DIAGNOSIS — Z20822 Contact with and (suspected) exposure to covid-19: Secondary | ICD-10-CM | POA: Diagnosis not present

## 2020-03-27 DIAGNOSIS — B9789 Other viral agents as the cause of diseases classified elsewhere: Secondary | ICD-10-CM | POA: Diagnosis not present

## 2020-03-27 DIAGNOSIS — Z7982 Long term (current) use of aspirin: Secondary | ICD-10-CM | POA: Insufficient documentation

## 2020-03-27 LAB — BASIC METABOLIC PANEL
Anion gap: 9 (ref 5–15)
BUN: 16 mg/dL (ref 8–23)
CO2: 29 mmol/L (ref 22–32)
Calcium: 8.9 mg/dL (ref 8.9–10.3)
Chloride: 100 mmol/L (ref 98–111)
Creatinine, Ser: 1.04 mg/dL (ref 0.61–1.24)
GFR, Estimated: >60 mL/min (ref >60)
Glucose, Bld: 117 mg/dL — ABNORMAL HIGH (ref 70–99)
Potassium: 3.8 mmol/L (ref 3.5–5.1)
Sodium: 138 mmol/L (ref 135–145)

## 2020-03-27 LAB — CBC
HCT: 44.1 % (ref 39.0–52.0)
Hemoglobin: 15.3 g/dL (ref 13.0–17.0)
MCH: 32.4 pg (ref 26.0–34.0)
MCHC: 34.7 g/dL (ref 30.0–36.0)
MCV: 93.4 fL (ref 80.0–100.0)
Platelets: 191 10*3/uL (ref 150–400)
RBC: 4.72 MIL/uL (ref 4.22–5.81)
RDW: 11.9 % (ref 11.5–15.5)
WBC: 8.7 10*3/uL (ref 4.0–10.5)
nRBC: 0 % (ref 0.0–0.2)

## 2020-03-27 LAB — RESPIRATORY PANEL BY RT PCR (FLU A&B, COVID)
Influenza A by PCR: NEGATIVE
Influenza B by PCR: NEGATIVE
SARS Coronavirus 2 by RT PCR: NEGATIVE

## 2020-03-27 MED ORDER — BENZONATATE 100 MG PO CAPS: 100.0000 mg | ORAL_CAPSULE | Freq: Three times a day (TID) | ORAL | 0 refills | Status: DC

## 2020-03-27 MED ORDER — AEROCHAMBER PLUS FLO-VU LARGE MISC
1.0000 | Freq: Once | Status: AC
  Administered 2020-03-27: 1
  Filled 2020-03-27: qty 1

## 2020-03-27 MED ORDER — ALBUTEROL SULFATE HFA 108 (90 BASE) MCG/ACT IN AERS
1.0000 | INHALATION_SPRAY | Freq: Four times a day (QID) | RESPIRATORY_TRACT | Status: DC | PRN
  Administered 2020-03-27: 2 via RESPIRATORY_TRACT
  Filled 2020-03-27: qty 6.7

## 2020-03-27 NOTE — ED Triage Notes (Signed)
Pt states began with non productive cough, congestion on Tuesday, went to urgent care that day tested negative for covid, negative for flu.  Symptoms have worsened over past 3 days.  Subjective fever.  Denies chest pain, Does report hiccups since yesterday.

## 2020-03-27 NOTE — ED Provider Notes (Signed)
[facility] EMERGENCY DEPARTMENT Provider Note   CSN: [id number] Arrival date & time: 03/27/20  6073     History Chief Complaint  Patient presents with   Cough    George [name] is a 79 y.o. male.  HPI   Patient presented to ED for evaluation of a persistent cough.  Patient states he has had a nonproductive cough as well as some nasal congestion that started on Tuesday.  He called his doctor and was instructed to get a Covid test.  Patient went to an urgent care and states he tested negative for Covid and the flu.  Over the last few days the patient has had increasing cough.  He is not having any chest pain.  He denies any shortness of breath.  He thinks he may have had some fevers but has not measured a fever.  Patient has had some intermittent hiccups.  Patient denies any chronic lung issues.  No leg swelling.  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    Past Surgical History:  Procedure Laterality Date   APPENDECTOMY  09/2003   BASAL CELL CARCINOMA EXCISION     nose   COLECTOMY Right 09/2003   Archie Endo 10/27/2010   COLON SURGERY     EP IMPLANTABLE DEVICE N/A 04/05/2016   Procedure: Loop Recorder Insertion;  Surgeon: Thompson Grayer, MD;  Location: [facility];  Service: Cardiovascular;   Laterality: N/A;   EP IMPLANTABLE DEVICE N/A 07/08/2016   MDT Azure XT DR implanted by Dr Curt Bears for sick sinus syndrome/ sinus pauses   EP IMPLANTABLE DEVICE N/A 07/08/2016   Procedure: Loop Recorder Removal;  Surgeon: Will Meredith Leeds, MD;  Location: [facility];  Service: Cardiovascular;  Laterality: N/A;   TONSILLECTOMY     TOTAL KNEE ARTHROPLASTY Right 03/03/2014   Procedure: RIGHT TOTAL KNEE ARTHROPLASTY;  Surgeon: Gearlean Alf, MD;  [facility];  Service: Orthopedics;  Laterality: Right;   TOTAL KNEE ARTHROPLASTY Left 01/2005   Archie Endo 10/27/2010       Family History  Problem Relation Age of Onset   Cancer Father    Diabetes Mellitus II Mother     Social History   Tobacco Use   Smoking status: Former Smoker    Packs/day: 2.00    Years: 20.00    Pack years: 40.00    Types: Cigarettes    Quit date: 1984    Years since quitting: 37.8   Smokeless tobacco: Never Used  Scientific laboratory technician Use: Never used  Substance Use Topics   Alcohol use: Yes    Alcohol/week: 6.0 standard drinks    Types: 6 Cans of beer per week   Drug use: No    Home Medications Prior to Admission medications  Medication Sig Start Date End Date Taking? Authorizing Provider  ANDROGEL PUMP 20.25 MG/ACT (1.62%) GEL Apply 1 application topically daily.  09/08/14   [provider]  aspirin EC 81 MG tablet Take 81 mg by mouth daily.    [provider]  benzonatate (TESSALON) 100 MG capsule Take 1 capsule (100 mg total) by mouth every 8 (eight) hours. 03/27/20   Dorie Rank, MD  buPROPion (WELLBUTRIN XL) 150 MG 24 hr tablet Take 450 mg by mouth every morning.    [provider]  celecoxib (CELEBREX) 200 MG capsule Take 200 mg by mouth daily.    [provider]  citalopram (CELEXA) 20 MG tablet Take 1 tablet by mouth daily. 11/03/17   [provider]  finasteride (PROSCAR) 5 MG tablet Take 5 mg by mouth every evening.     [provider]  Multiple Vitamin (MULTIVITAMIN WITH MINERALS) TABS tablet Take 1 tablet by mouth daily.    [provider]  propranolol (INDERAL) 20 MG tablet Take 20 mg by mouth daily.    [provider]  sodium chloride (OCEAN) 0.65 % SOLN nasal spray Place 1 spray into both nostrils as needed for congestion. 06/16/18   Law, Bea Graff, PA-C  tadalafil (CIALIS) 5 MG tablet Take by mouth as needed. 12/12/18   [provider]  tamsulosin (FLOMAX) 0.4 MG CAPS capsule Take 0.4 mg by mouth daily.    [provider]    Allergies    Patient has no known allergies.  Review of Systems   Review of Systems  All other systems reviewed and are negative.   Physical Exam Updated Vital Signs BP 136/87    Pulse 86    Temp 98.2 F (36.8 C) (Oral)    Resp 16    SpO2 96%   Physical Exam Vitals and nursing note reviewed.  Constitutional:      General: He is not in acute distress.    Appearance: He is well-developed.  HENT:     Head: Normocephalic and atraumatic.     Right Ear: External ear normal.     Left Ear: External ear normal.  Eyes:     General: No scleral icterus.       Right eye: No discharge.        Left eye: No discharge.     Conjunctiva/sclera: Conjunctivae normal.  Neck:     Trachea: No tracheal deviation.  Cardiovascular:     Rate and Rhythm: Normal rate and regular rhythm.  Pulmonary:     Effort: Pulmonary effort is normal. No respiratory distress.     Breath sounds: Normal breath sounds. No stridor. No wheezing or rales.     Comments: Frequent coughing, no hiccups noted Abdominal:     General: Bowel sounds are normal. There is no distension.     Palpations: Abdomen is soft.     Tenderness: There is no abdominal tenderness. There is no guarding or rebound.  Musculoskeletal:        General: No tenderness.     Cervical back: Neck supple.     Right lower leg: No edema.     Left lower leg: No edema.  Skin:    General: Skin is warm and dry.       Findings: No rash.  Neurological:     Mental Status: He is alert.     Cranial Nerves: No cranial nerve deficit (no facial droop, extraocular movements intact, no slurred speech).  Sensory: No sensory deficit.     Motor: No abnormal muscle tone or seizure activity.     Coordination: Coordination normal.     ED Results / Procedures / Treatments   Labs (all labs ordered are listed, but only abnormal results are displayed) Labs Reviewed  BASIC METABOLIC PANEL - Abnormal; Notable for the following components:      Result Value   Glucose, Bld 117 (*)    All other components within normal limits  RESPIRATORY PANEL BY RT PCR (FLU A&B, COVID)  CBC    EKG None  Radiology DG Chest Portable 1 View  Result Date: 03/27/2020 CLINICAL DATA:  Persistent cough. EXAM: PORTABLE CHEST 1 VIEW COMPARISON:  June 16, 2018. FINDINGS: The heart size and mediastinal contours are within normal limits. Both lungs are clear. No pneumothorax or pleural effusion is noted. Left-sided pacemaker is unchanged in position. The visualized skeletal structures are unremarkable. IMPRESSION: No active disease. Electronically Signed   By: Marijo Conception M.D.   On: 03/27/2020 10:27    Procedures Procedures (including critical care time)  Medications Ordered in ED Medications  albuterol (VENTOLIN HFA) 108 (90 Base) MCG/ACT inhaler 1-2 puff (has no administration in time range)  AeroChamber Plus Flo-Vu Large MISC 1 each (has no administration in time range)    ED Course  I have reviewed the triage vital signs and the nursing notes.  Pertinent labs & imaging results that were available during my care of the patient were reviewed by me and considered in my medical decision making (see chart for details).  Clinical Course as of Mar 27 1232  Fri Mar 27, 2020  1105 Laboratory tests are unremarkable.  Covid test is negative.   [CB]  7628 Chest x-ray without acute findings   [JK]    Clinical Course User  Index [JK] Dorie Rank, MD   MDM Rules/Calculators/A&P                          Patient presented to the ED for evaluation of persistent cough.  Patient is not having any shortness of breath.  Symptoms not suggestive of pulmonary embolism.  Chest x-ray does not show evidence of pneumonia pneumothorax or other abnormality.  Covid and flu tests were negative.  Suspect symptoms are related to a viral infection.  Will discharge home with course of Tessalon for his cough.  Patient was also given albuterol inhaler to see if that helps with his frequent coughing Final Clinical Impression(s) / ED Diagnoses Final diagnoses:  Viral URI with cough    Rx / DC Orders ED Discharge Orders         Ordered    benzonatate (TESSALON) 100 MG capsule  Every 8 hours        03/27/20 1231           Dorie Rank, MD 03/27/20 1233

## 2020-03-27 NOTE — Discharge Instructions (Addendum)
Your Covid and flu test were negative.  Your chest x-ray did not show pneumonia.  Follow-up with your doctor next week if the symptoms have not significantly improved

## 2020-03-28 DIAGNOSIS — R066 Hiccough: Secondary | ICD-10-CM | POA: Diagnosis not present

## 2020-03-28 DIAGNOSIS — R441 Visual hallucinations: Secondary | ICD-10-CM | POA: Diagnosis not present

## 2020-03-28 DIAGNOSIS — H532 Diplopia: Secondary | ICD-10-CM | POA: Diagnosis not present

## 2020-03-29 DIAGNOSIS — G319 Degenerative disease of nervous system, unspecified: Secondary | ICD-10-CM | POA: Diagnosis not present

## 2020-03-29 DIAGNOSIS — R4781 Slurred speech: Secondary | ICD-10-CM | POA: Diagnosis not present

## 2020-03-29 DIAGNOSIS — A419 Sepsis, unspecified organism: Secondary | ICD-10-CM | POA: Diagnosis not present

## 2020-03-29 DIAGNOSIS — I2510 Atherosclerotic heart disease of native coronary artery without angina pectoris: Secondary | ICD-10-CM | POA: Diagnosis not present

## 2020-03-29 DIAGNOSIS — F32A Depression, unspecified: Secondary | ICD-10-CM | POA: Diagnosis not present

## 2020-03-29 DIAGNOSIS — G9341 Metabolic encephalopathy: Secondary | ICD-10-CM | POA: Diagnosis not present

## 2020-03-29 DIAGNOSIS — E872 Acidosis: Secondary | ICD-10-CM | POA: Diagnosis not present

## 2020-03-29 DIAGNOSIS — I700 Atherosclerosis of aorta: Secondary | ICD-10-CM | POA: Diagnosis not present

## 2020-03-29 DIAGNOSIS — R220 Localized swelling, mass and lump, head: Secondary | ICD-10-CM | POA: Diagnosis not present

## 2020-03-29 DIAGNOSIS — K5730 Diverticulosis of large intestine without perforation or abscess without bleeding: Secondary | ICD-10-CM | POA: Diagnosis not present

## 2020-03-29 DIAGNOSIS — R4689 Other symptoms and signs involving appearance and behavior: Secondary | ICD-10-CM | POA: Diagnosis not present

## 2020-03-29 DIAGNOSIS — I6782 Cerebral ischemia: Secondary | ICD-10-CM | POA: Diagnosis not present

## 2020-03-29 DIAGNOSIS — Z20822 Contact with and (suspected) exposure to covid-19: Secondary | ICD-10-CM | POA: Diagnosis not present

## 2020-03-29 DIAGNOSIS — N281 Cyst of kidney, acquired: Secondary | ICD-10-CM | POA: Diagnosis not present

## 2020-03-29 DIAGNOSIS — R410 Disorientation, unspecified: Secondary | ICD-10-CM | POA: Diagnosis not present

## 2020-03-29 DIAGNOSIS — R29810 Facial weakness: Secondary | ICD-10-CM | POA: Diagnosis not present

## 2020-03-29 DIAGNOSIS — H538 Other visual disturbances: Secondary | ICD-10-CM | POA: Diagnosis not present

## 2020-03-29 DIAGNOSIS — R8281 Pyuria: Secondary | ICD-10-CM | POA: Diagnosis not present

## 2020-03-29 DIAGNOSIS — I6789 Other cerebrovascular disease: Secondary | ICD-10-CM | POA: Diagnosis not present

## 2020-03-29 DIAGNOSIS — F419 Anxiety disorder, unspecified: Secondary | ICD-10-CM | POA: Diagnosis not present

## 2020-03-29 DIAGNOSIS — R1111 Vomiting without nausea: Secondary | ICD-10-CM | POA: Diagnosis not present

## 2020-03-30 DIAGNOSIS — R9431 Abnormal electrocardiogram [ECG] [EKG]: Secondary | ICD-10-CM | POA: Diagnosis not present

## 2020-03-30 DIAGNOSIS — R569 Unspecified convulsions: Secondary | ICD-10-CM | POA: Diagnosis not present

## 2020-04-01 DIAGNOSIS — G9341 Metabolic encephalopathy: Secondary | ICD-10-CM | POA: Diagnosis not present

## 2020-04-02 DIAGNOSIS — F32A Depression, unspecified: Secondary | ICD-10-CM | POA: Diagnosis not present

## 2020-04-02 DIAGNOSIS — F419 Anxiety disorder, unspecified: Secondary | ICD-10-CM | POA: Diagnosis not present

## 2020-04-02 DIAGNOSIS — N400 Enlarged prostate without lower urinary tract symptoms: Secondary | ICD-10-CM | POA: Diagnosis not present

## 2020-04-02 DIAGNOSIS — G9341 Metabolic encephalopathy: Secondary | ICD-10-CM | POA: Diagnosis not present

## 2020-04-07 ENCOUNTER — Ambulatory Visit (INDEPENDENT_AMBULATORY_CARE_PROVIDER_SITE_OTHER): Payer: Medicare Other

## 2020-04-07 DIAGNOSIS — I495 Sick sinus syndrome: Secondary | ICD-10-CM

## 2020-04-08 DIAGNOSIS — R195 Other fecal abnormalities: Secondary | ICD-10-CM | POA: Diagnosis not present

## 2020-04-08 DIAGNOSIS — G9341 Metabolic encephalopathy: Secondary | ICD-10-CM | POA: Diagnosis not present

## 2020-04-08 DIAGNOSIS — E291 Testicular hypofunction: Secondary | ICD-10-CM | POA: Diagnosis not present

## 2020-04-09 DIAGNOSIS — R2689 Other abnormalities of gait and mobility: Secondary | ICD-10-CM | POA: Diagnosis not present

## 2020-04-09 DIAGNOSIS — Z741 Need for assistance with personal care: Secondary | ICD-10-CM | POA: Diagnosis not present

## 2020-04-10 LAB — CUP PACEART REMOTE DEVICE CHECK
Battery Remaining Longevity: 139 mo
Battery Voltage: 3.02 V
Brady Statistic AP VP Percent: 0 %
Brady Statistic AP VS Percent: 0.21 %
Brady Statistic AS VP Percent: 0.03 %
Brady Statistic AS VS Percent: 99.76 %
Brady Statistic RA Percent Paced: 0.25 %
Brady Statistic RV Percent Paced: 0.03 %
Date Time Interrogation Session: 20211029013454
Implantable Lead Implant Date: 20180126
Implantable Lead Implant Date: 20180126
Implantable Lead Location: 753859
Implantable Lead Location: 753860
Implantable Lead Model: 5076
Implantable Lead Model: 5076
Implantable Pulse Generator Implant Date: 20180126
Lead Channel Impedance Value: 304 Ohm
Lead Channel Impedance Value: 323 Ohm
Lead Channel Impedance Value: 380 Ohm
Lead Channel Impedance Value: 437 Ohm
Lead Channel Pacing Threshold Amplitude: 0.75 V
Lead Channel Pacing Threshold Amplitude: 0.875 V
Lead Channel Pacing Threshold Pulse Width: 0.4 ms
Lead Channel Pacing Threshold Pulse Width: 0.4 ms
Lead Channel Sensing Intrinsic Amplitude: 2.375 mV
Lead Channel Sensing Intrinsic Amplitude: 2.375 mV
Lead Channel Sensing Intrinsic Amplitude: 4.75 mV
Lead Channel Sensing Intrinsic Amplitude: 4.75 mV
Lead Channel Setting Pacing Amplitude: 2 V
Lead Channel Setting Pacing Amplitude: 2.5 V
Lead Channel Setting Pacing Pulse Width: 0.4 ms
Lead Channel Setting Sensing Sensitivity: 2 mV

## 2020-04-13 NOTE — Progress Notes (Signed)
Remote pacemaker transmission.   

## 2020-04-16 ENCOUNTER — Other Ambulatory Visit: Payer: Self-pay

## 2020-04-16 ENCOUNTER — Encounter: Payer: Self-pay | Admitting: Neurology

## 2020-04-16 ENCOUNTER — Ambulatory Visit (INDEPENDENT_AMBULATORY_CARE_PROVIDER_SITE_OTHER): Payer: Medicare Other | Admitting: Neurology

## 2020-04-16 VITALS — BP 138/76 | HR 71 | Ht 68.0 in | Wt 167.8 lb

## 2020-04-16 DIAGNOSIS — G3184 Mild cognitive impairment, so stated: Secondary | ICD-10-CM | POA: Diagnosis not present

## 2020-04-16 DIAGNOSIS — F05 Delirium due to known physiological condition: Secondary | ICD-10-CM

## 2020-04-16 NOTE — Patient Instructions (Signed)
I had a long discussion with the patient and his wife regarding his episode of acute confusional state being of unclear etiology possibly encephalopathy related to excessive drinking of cough syrup. He has done reasonably well on cognitive testing today but does have mild cognitive impairment and could have decompensated in acute stressful situation. I recommend we repeat EEG to look for seizure activity and dementia pending labs. I encouraged him to increase participation in cognitively challenging activities like solving crossword puzzles, playing bridge and sodoku. He'll return for follow-up in the future in 3 months or call earlier if necessary.  Toxic Metabolic Encephalopathy Toxic metabolic encephalopathy (TME) is a type of brain disorder caused by a change in brain chemistry. This condition may result from illnesses or conditions that cause an imbalance of fluid, minerals (electrolytes), and other substances in the body that affect the way the brain functions. It is not caused by brain damage or brain disease. TME can cause confusion and other mental disturbances, which are generally referred to as delirium. Untreated delirium may lead to permanent mental changes or worsening medical conditions. Untreated delirium is a life-threatening condition that may need to be treated in the hospital. What are the causes? Possible causes of TME that can lead to deliriuminclude:  Short-term (acute) or long-term (chronic) disease of the kidney or liver.  Not having enough fluid in the body (dehydration).  Changes in the acid level (pH) of the blood.  High or low levels of any of the following substances in the blood: ? Calcium. ? Salt (sodium). ? Sugar (glucose). ? Magnesium. ? Phosphate.  High body temperature.  Not having enough oxygen in the blood.  Low levels of B vitamins. This can result from alcohol abuse.  Certain medicines, such as steroids and medicines that reduce the activity of the  immune system (immunosuppressants).  Certain infections. What increases the risk? You may have a higher risk for TME if you:  Are elderly.  Have dementia.  Are in the hospital, especially in intensive care.  Live in a nursing home.  Had recent surgery.  Have liver or kidney disease.  Have poorly controlled diabetes.  Have chronic medical problems, especially heart or lung disease.  Are not getting enough fluids.  Have poor nutrition.  Abuse alcohol. What are the signs or symptoms? Symptoms of TME may include:  Muscle stiffness or jerking (spasticity).  Shaking (tremors).  Flapping of the hands.  Weakness.  Clumsiness.  Slowed breathing.  Jerky movements that you cannot control (seizures).  Not being able to stay awake (drowsiness).  Not being able to pay attention.  Loss of consciousness (coma). Symptoms of delirium caused by TME include:  Confusion.  Difficulty focusing or concentrating, or inability to focus or concentrate.  Not knowing where you are (disorientation).  Seeing or hearing things that are not real (hallucinations).  Fearfulness.  False beliefs (delusions).  Changes in mood or personality.  Changes in speech, such as saying things that do not make sense.  Memory loss.  Irritability.  Avoiding other people (withdrawal).  Depression.  Poor judgment.  Changes in eating and sleeping patterns.  Hyperactivity.  Decreased alertness.  General mistrust of others (paranoia). Delirium may come and go. Symptoms of delirium may start suddenly or gradually, and they often get worse at night. How is this diagnosed? This condition is diagnosed based on:  Your symptoms and behavior.  An exam to check how you are thinking, feeling, and behaving (mental status exam). To diagnose delirium, the  mental status exam must rule out other possible causes of TME, and must show: ? Changes in attention and awareness. ? Changes that develop  over a short period of time and tend to come and go (fluctuate). ? Changes in memory, language, and thinking that were not present before.  A physical exam.  Imaging tests, such as: ? MRI. ? CT scan.  Blood tests to: ? Measure liver and kidney function. ? Check for a lack (deficiency) of vitamin B. ? Check for changes in acid levels (pH) and changes in calcium, sodium, or magnesium levels in the blood. ? Measure your blood sugar (glucose). ? Measure your blood oxygen level. How is this treated? Treatment for TME depends on the cause, and it may include.  Getting fluids through an IV tube.  Regulating calcium, sodium, glucose, or magnesium levels in the body.  Getting oxygen.  Improving nutrition.  Treating liver or kidney disease.  Adjusting certain medicines.  Treating infections. If the cause is found and treated, delirium usually improves. Managing delirium may include:  Keeping the room well-lit and quiet.  Using calendars, pictures, and clocks to prevent disorientation.  Having frequent checks from nursing staff and visits from caregivers.  Wearing eyeglasses or a hearing aid, if needed.  Physical therapy.  Medicine to treat agitation, anxiety, hallucinations, or delusions. Follow these instructions at home:  Drink enough fluid to keep your urine clear or pale yellow.  Take over-the-counter and prescription medicines only as told by your health care provider.  Return to your normal activities as told by your health care provider. Ask your health care provider what activities are safe for you.  Follow a healthy diet. Do not skip meals.  Do not drink alcohol.  Go to bed at the same time every night.  Keep all follow-up visits as told by your health care provider. This is important. Contact a health care provider if:  You are unable to feed yourself or hydrate yourself.  You need help at home.  You start to feel clumsy.  You start to have tremors  or weakness. Get help right away if:  You have a seizure.  You lose consciousness.  You have trouble breathing.  You do not feel able to care for yourself at home.  You have a fever.  You become disoriented at home. This information is not intended to replace advice given to you by your health care provider. Make sure you discuss any questions you have with your health care provider. Document Revised: 05/12/2017 Document Reviewed: 11/06/2015 Elsevier Patient Education  2020 [employer].

## 2020-04-16 NOTE — Progress Notes (Addendum)
[facility] [address] [place]. [place] [address] [phone]       OFFICE CONSULT NOTE  [name] Date of Birth:  Nov 13, 1940 Medical Record Number:  [id number]   Referring MD: [name]  Reason for Referral: Encephalopathy  HPI: George [name] is a 79 year old Caucasian male seen today for initial office consultation visit.  Is accompanied by his wife.  History is obtained from them and review of hospital referral notes and I personally reviewed available pertinent imaging films in PACS.  He has past medical history of sick sinus syndrome status post pacemaker and treated basal cell skin cancer and arthritis.  He was admitted to [facility] on 03/29/2020 to 04/04/2020.  He was found by his wife to be sitting and not responding on the floor with 2 empty bottles of cough syrup next to him.  He was found in the hospital to be quite confused and disoriented.  Initial impression was that he had Wernicke's encephalopathy and was a heavy drinker but the patient and wife both deny this.  Admission labs were pretty much unremarkable.  Subsequently underwent a CT scan of the head which showed no acute abnormality and MRI scan also showed no acute stroke no significant abnormality though it is significantly motion degraded and suboptimal quality.  He also had an EEG which was apparently normal but I do not have the actual report.  The patient gradually improved but to date does not have full memory of the event.  He remembers bits and pieces of the hospitalization but does not remember EMS taking him to the hospital.  Denied any headache at that time double vision, focal extremity weakness, numbness vertigo or diplopia.  He has no known prior history of strokes, TIAs, seizures, significant head injury with loss of consciousness or any other neurological problems.  He states is done well but still has some short-term memory difficulties particularly related to the  episode.  ROS:   14 system review of systems is positive for confusion, hallucinations, agitation, disorientation, decreased memory, hiccups, gastritis and all other systems negative PMH:  Past Medical History:  Diagnosis Date  . Arthritis    "qwhere" (07/08/2016)  . Cancer ([place])    basal cell skin cancer removed from nose  and pre cancerous hemicolectomy years ago   . Depression   . Sick sinus syndrome ([place])    a. s/p MDT dual chamber PPM   . Small bowel obstruction ([place])     Social History:  Social History   Socioeconomic History  . Marital status: Married    Spouse name: Not on file  . Number of children: Not on file  . Years of education: Not on file  . Highest education level: Not on file  Occupational History  . Not on file  Tobacco Use  . Smoking status: Former Smoker    Packs/day: 2.00    Years: 20.00    Pack years: 40.00    Types: Cigarettes    Quit date: 1984    Years since quitting: 37.8  . Smokeless tobacco: Never Used  Vaping Use  . Vaping Use: Never used  Substance and Sexual Activity  . Alcohol use: Yes    Alcohol/week: 6.0 standard drinks    Types: 6 Cans of beer per week  . Drug use: No  . Sexual activity: Not on file  Other Topics Concern  . Not on file  Social History Narrative   Lives with wife  Right Handed   Drinks 2-3 cups caffeine daily   Social Determinants of Health   Financial Resource Strain:   . Difficulty of Paying Living Expenses: Not on file  Food Insecurity:   . Worried About Charity fundraiser in the Last Year: Not on file  . Ran Out of Food in the Last Year: Not on file  Transportation Needs:   . Lack of Transportation (Medical): Not on file  . Lack of Transportation (Non-Medical): Not on file  Physical Activity:   . Days of Exercise per Week: Not on file  . Minutes of Exercise per Session: Not on file  Stress:   . Feeling of Stress : Not on file  Social Connections:   . Frequency of Communication with Friends  and Family: Not on file  . Frequency of Social Gatherings with Friends and Family: Not on file  . Attends Religious Services: Not on file  . Active Member of Clubs or Organizations: Not on file  . Attends Archivist Meetings: Not on file  . Marital Status: Not on file  Intimate Partner Violence:   . Fear of Current or Ex-Partner: Not on file  . Emotionally Abused: Not on file  . Physically Abused: Not on file  . Sexually Abused: Not on file    Medications:   Current Outpatient Medications on File Prior to Visit  Medication Sig Dispense Refill  . ANDROGEL PUMP 20.25 MG/ACT (1.62%) GEL Apply 1 application topically daily.     Marland Kitchen aspirin EC 81 MG tablet Take 81 mg by mouth daily.    . benzonatate (TESSALON) 100 MG capsule Take 1 capsule (100 mg total) by mouth every 8 (eight) hours. 21 capsule 0  . buPROPion (WELLBUTRIN XL) 150 MG 24 hr tablet Take 450 mg by mouth every morning.    . celecoxib (CELEBREX) 200 MG capsule Take 200 mg by mouth every other day.     . citalopram (CELEXA) 20 MG tablet Take 1 tablet by mouth daily.    . finasteride (PROSCAR) 5 MG tablet Take 5 mg by mouth every evening.     . folic acid (FOLVITE) 1 MG tablet Take 1 mg by mouth daily.    . Multiple Vitamin (MULTIVITAMIN WITH MINERALS) TABS tablet Take 1 tablet by mouth daily.    . pantoprazole (PROTONIX) 40 MG tablet Take 40 mg by mouth daily.    . propranolol (INDERAL) 20 MG tablet Take 20 mg by mouth daily.    . sodium chloride (OCEAN) 0.65 % SOLN nasal spray Place 1 spray into both nostrils as needed for congestion. 15 mL 0  . tadalafil (CIALIS) 5 MG tablet Take by mouth as needed.    . tamsulosin (FLOMAX) 0.4 MG CAPS capsule Take 0.4 mg by mouth daily.     No current facility-administered medications on file prior to visit.    Allergies:  No Known Allergies  Physical Exam General: well developed, well nourished elderly Caucasian male, seated, in no evident distress Head: head normocephalic and  atraumatic.   Neck: supple with no carotid or supraclavicular bruits Cardiovascular: regular rate and rhythm, no murmurs Musculoskeletal: no deformity Skin:  no rash/petichiae Vascular:  Normal pulses all extremities  Neurologic Exam Mental Status: Awake and fully alert. Oriented to place and time. Recent and remote memory intact. Attention span, concentration and fund of knowledge appropriate. Mood and affect appropriate.  Diminished recall 1/3.  Able to name 10 animals which can walk on 4 legs.  Clock  drawing 4/4.  On Mini-Mental status exam he scored 30/30 with my nurse but with me he missed 2 points in recall. Cranial Nerves: Fundoscopic exam reveals sharp disc margins. Pupils equal, briskly reactive to light. Extraocular movements full without nystagmus. Visual fields full to confrontation. Hearing intact. Facial sensation intact. Face, tongue, palate moves normally and symmetrically.  Motor: Normal bulk and tone. Normal strength in all tested extremity muscles. Sensory.: intact to touch , pinprick , position and vibratory sensation.  Coordination: Rapid alternating movements normal in all extremities. Finger-to-nose and heel-to-shin performed accurately bilaterally. Gait and Station: Arises from chair without difficulty. Stance is normal. Gait demonstrates normal stride length and balance . Able to heel, toe and tandem walk without difficulty.  Reflexes: 1+ and symmetric. Toes downgoing.       ASSESSMENT: 79 year old Caucasian male with transient episode of acute confusional state and psychosis following drinking excess cough syrup of unclear etiology possibly toxic metabolic encephalopathy which appears to have resolved.  Doubt seizures or postictal confusion or strokes.     PLAN: I had a long discussion with the patient and his wife regarding his episode of acute confusional state being of unclear etiology possibly encephalopathy related to excessive drinking of cough syrup. He has  done reasonably well on cognitive testing today but does have mild cognitive impairment and could have decompensated in acute stressful situation. I recommend we repeat EEG to look for seizure activity and dementia pending labs. I encouraged him to increase participation in cognitively challenging activities like solving crossword puzzles, playing bridge and sodoku. He'll return for follow-up in the future in 3 months or call earlier if necessary.  Greater than 50% time during this 45-minute consultation visit was spent on counseling and coordination of care about his episode of acute confusion and answering questions. Antony Contras, MD Note: This document was prepared with digital dictation and possible smart phrase technology. Any transcriptional errors that result from this process are unintentional.

## 2020-04-17 LAB — DEMENTIA PANEL
Homocysteine: 6.7 umol/L (ref 0.0–19.2)
RPR Ser Ql: NONREACTIVE
TSH: 1.19 u[IU]/mL (ref 0.450–4.500)
Vitamin B-12: >2000 pg/mL — ABNORMAL HIGH (ref 232–1245)

## 2020-04-23 NOTE — Progress Notes (Signed)
Kindly inform the patient that lab work for reversible causes of memory loss was normal

## 2020-04-24 DIAGNOSIS — R195 Other fecal abnormalities: Secondary | ICD-10-CM | POA: Diagnosis not present

## 2020-04-27 ENCOUNTER — Encounter: Payer: Self-pay | Admitting: *Deleted

## 2020-05-13 DIAGNOSIS — Z23 Encounter for immunization: Secondary | ICD-10-CM | POA: Diagnosis not present

## 2020-05-13 DIAGNOSIS — R3912 Poor urinary stream: Secondary | ICD-10-CM | POA: Diagnosis not present

## 2020-05-13 DIAGNOSIS — R195 Other fecal abnormalities: Secondary | ICD-10-CM | POA: Diagnosis not present

## 2020-05-13 DIAGNOSIS — F3341 Major depressive disorder, recurrent, in partial remission: Secondary | ICD-10-CM | POA: Diagnosis not present

## 2020-05-13 DIAGNOSIS — K219 Gastro-esophageal reflux disease without esophagitis: Secondary | ICD-10-CM | POA: Diagnosis not present

## 2020-05-13 DIAGNOSIS — N401 Enlarged prostate with lower urinary tract symptoms: Secondary | ICD-10-CM | POA: Diagnosis not present

## 2020-05-13 DIAGNOSIS — E291 Testicular hypofunction: Secondary | ICD-10-CM | POA: Diagnosis not present

## 2020-06-01 DIAGNOSIS — S20212A Contusion of left front wall of thorax, initial encounter: Secondary | ICD-10-CM | POA: Diagnosis not present

## 2020-06-02 DIAGNOSIS — Z23 Encounter for immunization: Secondary | ICD-10-CM | POA: Diagnosis not present

## 2020-06-11 DIAGNOSIS — Z1159 Encounter for screening for other viral diseases: Secondary | ICD-10-CM | POA: Diagnosis not present

## 2020-06-19 DIAGNOSIS — H52223 Regular astigmatism, bilateral: Secondary | ICD-10-CM | POA: Diagnosis not present

## 2020-06-19 DIAGNOSIS — H524 Presbyopia: Secondary | ICD-10-CM | POA: Diagnosis not present

## 2020-06-19 DIAGNOSIS — H2513 Age-related nuclear cataract, bilateral: Secondary | ICD-10-CM | POA: Diagnosis not present

## 2020-06-19 DIAGNOSIS — H5203 Hypermetropia, bilateral: Secondary | ICD-10-CM | POA: Diagnosis not present

## 2020-07-07 ENCOUNTER — Ambulatory Visit (INDEPENDENT_AMBULATORY_CARE_PROVIDER_SITE_OTHER): Payer: Medicare Other

## 2020-07-07 DIAGNOSIS — I495 Sick sinus syndrome: Secondary | ICD-10-CM | POA: Diagnosis not present

## 2020-07-08 LAB — CUP PACEART REMOTE DEVICE CHECK
Battery Remaining Longevity: 136 mo
Battery Voltage: 3.02 V
Brady Statistic AP VP Percent: 0.01 %
Brady Statistic AP VS Percent: 0.28 %
Brady Statistic AS VP Percent: 0.03 %
Brady Statistic AS VS Percent: 99.68 %
Brady Statistic RA Percent Paced: 0.28 %
Brady Statistic RV Percent Paced: 0.04 %
Date Time Interrogation Session: 20220126005518
Implantable Lead Implant Date: 20180126
Implantable Lead Implant Date: 20180126
Implantable Lead Location: 753859
Implantable Lead Location: 753860
Implantable Lead Model: 5076
Implantable Lead Model: 5076
Implantable Pulse Generator Implant Date: 20180126
Lead Channel Impedance Value: 285 Ohm
Lead Channel Impedance Value: 323 Ohm
Lead Channel Impedance Value: 380 Ohm
Lead Channel Impedance Value: 418 Ohm
Lead Channel Pacing Threshold Amplitude: 0.625 V
Lead Channel Pacing Threshold Amplitude: 0.75 V
Lead Channel Pacing Threshold Pulse Width: 0.4 ms
Lead Channel Pacing Threshold Pulse Width: 0.4 ms
Lead Channel Sensing Intrinsic Amplitude: 2.125 mV
Lead Channel Sensing Intrinsic Amplitude: 2.125 mV
Lead Channel Sensing Intrinsic Amplitude: 4.25 mV
Lead Channel Sensing Intrinsic Amplitude: 4.25 mV
Lead Channel Setting Pacing Amplitude: 2 V
Lead Channel Setting Pacing Amplitude: 2.5 V
Lead Channel Setting Pacing Pulse Width: 0.4 ms
Lead Channel Setting Sensing Sensitivity: 2 mV

## 2020-07-18 NOTE — Progress Notes (Signed)
Remote pacemaker transmission.   

## 2020-07-22 ENCOUNTER — Encounter: Payer: Self-pay | Admitting: Neurology

## 2020-07-22 ENCOUNTER — Ambulatory Visit (INDEPENDENT_AMBULATORY_CARE_PROVIDER_SITE_OTHER): Payer: Medicare Other | Admitting: Neurology

## 2020-07-22 VITALS — BP 146/84 | HR 76 | Ht 68.0 in | Wt 177.4 lb

## 2020-07-22 DIAGNOSIS — G9340 Encephalopathy, unspecified: Secondary | ICD-10-CM

## 2020-07-22 DIAGNOSIS — H532 Diplopia: Secondary | ICD-10-CM | POA: Diagnosis not present

## 2020-07-22 NOTE — Progress Notes (Signed)
[facility] [address] [place]. [place] [address] [phone]       OFFICE FOLLOW UP VISIT NOTE  [name] Date of Birth:  10-23-1940 Medical Record Number:  [id number]   Referring MD: [name]  Reason for Referral: Encephalopathy  HPI: Initial visit 04/16/2020 George [name] is a 80 year old Caucasian male seen today for initial office consultation visit.  Is accompanied by his wife.  History is obtained from them and review of hospital referral notes and I personally reviewed available pertinent imaging films in PACS.  He has past medical history of sick sinus syndrome status post pacemaker and treated basal cell skin cancer and arthritis.  He was admitted to [facility] on 03/29/2020 to 04/04/2020.  He was found by his wife to be sitting and not responding on the floor with 2 empty bottles of cough syrup next to him.  He was found in the hospital to be quite confused and disoriented.  Initial impression was that he had Wernicke's encephalopathy and was a heavy drinker but the patient and wife both deny this.  Admission labs were pretty much unremarkable.  Subsequently underwent a CT scan of the head which showed no acute abnormality and MRI scan also showed no acute stroke no significant abnormality though it is significantly motion degraded and suboptimal quality.  He also had an EEG which was apparently normal but I do not have the actual report.  The patient gradually improved but to date does not have full memory of the event.  He remembers bits and pieces of the hospitalization but does not remember EMS taking him to the hospital.  Denied any headache at that time double vision, focal extremity weakness, numbness vertigo or diplopia.  He has no known prior history of strokes, TIAs, seizures, significant head injury with loss of consciousness or any other neurological problems.  He states is done well but still has some short-term memory  difficulties particularly related to the episode. Update 07/22/2020: He returns for follow-up after last visit 3 months ago.  Is accompanied by his wife.  He states he has noticed a definite improvement in his cognition and thinking.  He is able to figure out and do more things for himself.  He does keep himself busy with doing cognitively challenging activities.  He had lab work on 04/16/2020 for vitamin B12, TSH, RPR and homocystine all of which were normal.  I had requested an EEG but for unclear reason he has not had it done yet.  Patient informed me today that he has had for more than 1year diplopia which he describes as binocular and horizontal.  He has seen 2 different eye doctors and has been given prisms which mostly have helped but she still gets intermittent diplopia.  He has learned to put some tape on his glasses to overcome this and he has been able to drive also.  He has no other new complaints. ROS:   14 system review of systems is positive for confusion, hallucinations, agitation, disorientation, decreased memory, hiccups, gastritis and all other systems negative PMH:  Past Medical History:  Diagnosis Date  . Arthritis    "qwhere" (07/08/2016)  . Cancer ([place])    basal cell skin cancer removed from nose  and pre cancerous hemicolectomy years ago   . Depression   . Sick sinus syndrome ([place])    a. s/p MDT dual chamber PPM   . Small bowel obstruction ([place])     Social History:  Social History   Socioeconomic History  . Marital status: Married    Spouse name: George [name]  . Number of children: Not on file  . Years of education: Not on file  . Highest education level: Not on file  Occupational History  . Not on file  Tobacco Use  . Smoking status: Former Smoker    Packs/day: 2.00    Years: 20.00    Pack years: 40.00    Types: Cigarettes    Quit date: 1984    Years since quitting: 38.1  . Smokeless tobacco: Never Used  Vaping Use  . Vaping Use: Never used  Substance and Sexual  Activity  . Alcohol use: Yes    Alcohol/week: 6.0 standard drinks    Types: 6 Cans of beer per week  . Drug use: No  . Sexual activity: Not on file  Other Topics Concern  . Not on file  Social History Narrative   Lives with wife    Right Handed   Drinks 2-3 cups caffeine daily   Social Determinants of Health   Financial Resource Strain: Not on file  Food Insecurity: Not on file  Transportation Needs: Not on file  Physical Activity: Not on file  Stress: Not on file  Social Connections: Not on file  Intimate Partner Violence: Not on file    Medications:   Current Outpatient Medications on File Prior to Visit  Medication Sig Dispense Refill  . ANDROGEL PUMP 20.25 MG/ACT (1.62%) GEL Apply 1 application topically daily.     Marland Kitchen aspirin EC 81 MG tablet Take 81 mg by mouth daily.    . benzonatate (TESSALON) 100 MG capsule Take 1 capsule (100 mg total) by mouth every 8 (eight) hours. 21 capsule 0  . buPROPion (WELLBUTRIN XL) 150 MG 24 hr tablet Take 450 mg by mouth every morning.    . celecoxib (CELEBREX) 200 MG capsule Take 200 mg by mouth every other day.     . citalopram (CELEXA) 20 MG tablet Take 1 tablet by mouth daily.    . finasteride (PROSCAR) 5 MG tablet Take 5 mg by mouth every evening.     . folic acid (FOLVITE) 1 MG tablet Take 1 mg by mouth daily.    . Multiple Vitamin (MULTIVITAMIN WITH MINERALS) TABS tablet Take 1 tablet by mouth daily.    . pantoprazole (PROTONIX) 40 MG tablet Take 40 mg by mouth daily.    . propranolol (INDERAL) 20 MG tablet Take 20 mg by mouth daily.    . sodium chloride (OCEAN) 0.65 % SOLN nasal spray Place 1 spray into both nostrils as needed for congestion. 15 mL 0  . tadalafil (CIALIS) 5 MG tablet Take by mouth as needed.    . tamsulosin (FLOMAX) 0.4 MG CAPS capsule Take 0.4 mg by mouth daily.     No current facility-administered medications on file prior to visit.    Allergies:  No Known Allergies  Physical Exam General: well developed,  well nourished elderly Caucasian male, seated, in no evident distress Head: head normocephalic and atraumatic.   Neck: supple with no carotid or supraclavicular bruits Cardiovascular: regular rate and rhythm, no murmurs Musculoskeletal: no deformity Skin:  no rash/petichiae Vascular:  Normal pulses all extremities  Neurologic Exam Mental Status: Awake and fully alert. Oriented to place and time. Recent and remote memory intact. Attention span, concentration and fund of knowledge appropriate. Mood and affect appropriate.    Cranial Nerves: Fundoscopic exam reveals sharp disc margins. Pupils equal, briskly  reactive to light. Extraocular movements full without nystagmus but does complain of subjective horizontal diplopia on right lateral gaze.. Visual fields full to confrontation. Hearing intact. Facial sensation intact. Face, tongue, palate moves normally and symmetrically.  Motor: Normal bulk and tone. Normal strength in all tested extremity muscles. Sensory.: intact to touch , pinprick , position and vibratory sensation.  Coordination: Rapid alternating movements normal in all extremities. Finger-to-nose and heel-to-shin performed accurately bilaterally. Gait and Station: Arises from chair without difficulty. Stance is normal. Gait demonstrates normal stride length and balance . Able to heel, toe and tandem walk without difficulty.  Reflexes: 1+ and symmetric. Toes downgoing.       ASSESSMENT: 80 year old Caucasian male with transient episode of acute confusional state and psychosis following drinking excess cough syrup of unclear etiology possibly toxic metabolic encephalopathy which appears to have resolved.  Doubt seizures or postictal confusion or strokes.  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     PLAN: I had a George discussion with the patient and his wife about his episode of encephalopathy which appears to have improved.  Recommend he check  an EEG to look for any electrical irritability in the brain.  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  I recommend he continue to wear prisms and to use tape on his glasses while driving.  He will return for follow-up with me in the future only as necessary and no schedule appointment was made I encouraged him to increase participation in cognitively challenging activities like solving crossword puzzles, playing bridge and sodoku. He'll return for follow-up in the future in 3 months or call earlier if necessary.  Greater than 50% time during this 25-minute  visit was spent on counseling and coordination of care about his episode of acute confusion and answering questions. Antony Contras, MD Note: This document was prepared with digital dictation and possible smart phrase technology. Any transcriptional errors that result from this process are unintentional.

## 2020-07-22 NOTE — Patient Instructions (Signed)
I had a long discussion with the patient and his wife about his episode of encephalopathy which appears to have improved.  Recommend he check an EEG to look for any electrical irritability in the brain.  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  I recommend he continue to wear prisms and to use tape on his glasses while driving.  He will return for follow-up with me in the future only as necessary and no schedule appointment was made

## 2020-08-03 ENCOUNTER — Ambulatory Visit (INDEPENDENT_AMBULATORY_CARE_PROVIDER_SITE_OTHER): Payer: Medicare Other | Admitting: Neurology

## 2020-08-03 DIAGNOSIS — R4182 Altered mental status, unspecified: Secondary | ICD-10-CM | POA: Diagnosis not present

## 2020-08-03 DIAGNOSIS — G9340 Encephalopathy, unspecified: Secondary | ICD-10-CM

## 2020-08-06 ENCOUNTER — Telehealth: Payer: Self-pay | Admitting: Neurology

## 2020-08-06 DIAGNOSIS — H2513 Age-related nuclear cataract, bilateral: Secondary | ICD-10-CM | POA: Diagnosis not present

## 2020-08-06 DIAGNOSIS — H2512 Age-related nuclear cataract, left eye: Secondary | ICD-10-CM | POA: Diagnosis not present

## 2020-08-06 DIAGNOSIS — H25013 Cortical age-related cataract, bilateral: Secondary | ICD-10-CM | POA: Diagnosis not present

## 2020-08-06 DIAGNOSIS — H25043 Posterior subcapsular polar age-related cataract, bilateral: Secondary | ICD-10-CM | POA: Diagnosis not present

## 2020-08-06 DIAGNOSIS — H18413 Arcus senilis, bilateral: Secondary | ICD-10-CM | POA: Diagnosis not present

## 2020-08-06 NOTE — Telephone Encounter (Signed)
Pt is asking for a call to discuss in greater details the results from his EEG

## 2020-08-10 ENCOUNTER — Telehealth: Payer: Self-pay | Admitting: Neurology

## 2020-08-10 NOTE — Telephone Encounter (Signed)
[facility]) called, Pt having cataract surgery end of March. Notifying Dr. Leonie Man Pt has double vision 1+ years.

## 2020-08-10 NOTE — Telephone Encounter (Signed)
Left Message for Jacqlyn Larsen to return call

## 2020-08-11 NOTE — Telephone Encounter (Signed)
I called and left a message on the patient's answering machine stating that the EEG study was normal and we had done the study to look for any irritability or risk for seizures did not find anything to worry about.  Was advised to call me back if he had any further questions.

## 2020-08-11 NOTE — Telephone Encounter (Signed)
[facility]) called, was returning your phone call. The prior message was a FYI, but you can call back if you have any questions.

## 2020-08-14 NOTE — Progress Notes (Signed)
Kindly inform the patient that EEG study was normal

## 2020-08-17 ENCOUNTER — Encounter: Payer: Self-pay | Admitting: *Deleted

## 2020-08-17 DIAGNOSIS — H1132 Conjunctival hemorrhage, left eye: Secondary | ICD-10-CM | POA: Diagnosis not present

## 2020-08-21 DIAGNOSIS — N138 Other obstructive and reflux uropathy: Secondary | ICD-10-CM | POA: Diagnosis not present

## 2020-08-21 DIAGNOSIS — Z87898 Personal history of other specified conditions: Secondary | ICD-10-CM | POA: Diagnosis not present

## 2020-08-21 DIAGNOSIS — H5203 Hypermetropia, bilateral: Secondary | ICD-10-CM | POA: Diagnosis not present

## 2020-08-21 DIAGNOSIS — N401 Enlarged prostate with lower urinary tract symptoms: Secondary | ICD-10-CM | POA: Diagnosis not present

## 2020-08-21 DIAGNOSIS — H524 Presbyopia: Secondary | ICD-10-CM | POA: Diagnosis not present

## 2020-08-21 DIAGNOSIS — N5201 Erectile dysfunction due to arterial insufficiency: Secondary | ICD-10-CM | POA: Diagnosis not present

## 2020-08-21 DIAGNOSIS — H52223 Regular astigmatism, bilateral: Secondary | ICD-10-CM | POA: Diagnosis not present

## 2020-08-21 DIAGNOSIS — H1131 Conjunctival hemorrhage, right eye: Secondary | ICD-10-CM | POA: Diagnosis not present

## 2020-09-02 DIAGNOSIS — Z9842 Cataract extraction status, left eye: Secondary | ICD-10-CM | POA: Diagnosis not present

## 2020-09-02 DIAGNOSIS — H2512 Age-related nuclear cataract, left eye: Secondary | ICD-10-CM | POA: Diagnosis not present

## 2020-09-02 DIAGNOSIS — Z961 Presence of intraocular lens: Secondary | ICD-10-CM | POA: Diagnosis not present

## 2020-09-03 DIAGNOSIS — H2511 Age-related nuclear cataract, right eye: Secondary | ICD-10-CM | POA: Diagnosis not present

## 2020-09-23 DIAGNOSIS — H2511 Age-related nuclear cataract, right eye: Secondary | ICD-10-CM | POA: Diagnosis not present

## 2020-10-06 ENCOUNTER — Ambulatory Visit (INDEPENDENT_AMBULATORY_CARE_PROVIDER_SITE_OTHER): Payer: Medicare Other

## 2020-10-06 DIAGNOSIS — I495 Sick sinus syndrome: Secondary | ICD-10-CM

## 2020-10-13 LAB — CUP PACEART REMOTE DEVICE CHECK
Battery Remaining Longevity: 133 mo
Battery Voltage: 3.02 V
Brady Statistic AP VP Percent: 0.01 %
Brady Statistic AP VS Percent: 0.6 %
Brady Statistic AS VP Percent: 0.03 %
Brady Statistic AS VS Percent: 99.36 %
Brady Statistic RA Percent Paced: 0.64 %
Brady Statistic RV Percent Paced: 0.04 %
Date Time Interrogation Session: 20220429015109
Implantable Lead Implant Date: 20180126
Implantable Lead Implant Date: 20180126
Implantable Lead Location: 753859
Implantable Lead Location: 753860
Implantable Lead Model: 5076
Implantable Lead Model: 5076
Implantable Pulse Generator Implant Date: 20180126
Lead Channel Impedance Value: 304 Ohm
Lead Channel Impedance Value: 323 Ohm
Lead Channel Impedance Value: 399 Ohm
Lead Channel Impedance Value: 399 Ohm
Lead Channel Pacing Threshold Amplitude: 0.625 V
Lead Channel Pacing Threshold Amplitude: 0.75 V
Lead Channel Pacing Threshold Pulse Width: 0.4 ms
Lead Channel Pacing Threshold Pulse Width: 0.4 ms
Lead Channel Sensing Intrinsic Amplitude: 2.25 mV
Lead Channel Sensing Intrinsic Amplitude: 2.25 mV
Lead Channel Sensing Intrinsic Amplitude: 4.375 mV
Lead Channel Sensing Intrinsic Amplitude: 4.375 mV
Lead Channel Setting Pacing Amplitude: 2 V
Lead Channel Setting Pacing Amplitude: 2.5 V
Lead Channel Setting Pacing Pulse Width: 0.4 ms
Lead Channel Setting Sensing Sensitivity: 2 mV

## 2020-10-28 NOTE — Progress Notes (Signed)
Remote pacemaker transmission.   

## 2020-11-12 DIAGNOSIS — R350 Frequency of micturition: Secondary | ICD-10-CM | POA: Diagnosis not present

## 2020-11-12 DIAGNOSIS — E291 Testicular hypofunction: Secondary | ICD-10-CM | POA: Diagnosis not present

## 2020-11-12 DIAGNOSIS — R251 Tremor, unspecified: Secondary | ICD-10-CM | POA: Diagnosis not present

## 2020-11-12 DIAGNOSIS — Z13220 Encounter for screening for lipoid disorders: Secondary | ICD-10-CM | POA: Diagnosis not present

## 2020-11-12 DIAGNOSIS — F3341 Major depressive disorder, recurrent, in partial remission: Secondary | ICD-10-CM | POA: Diagnosis not present

## 2020-11-12 DIAGNOSIS — Z136 Encounter for screening for cardiovascular disorders: Secondary | ICD-10-CM | POA: Diagnosis not present

## 2020-11-12 DIAGNOSIS — Z125 Encounter for screening for malignant neoplasm of prostate: Secondary | ICD-10-CM | POA: Diagnosis not present

## 2020-11-12 DIAGNOSIS — N401 Enlarged prostate with lower urinary tract symptoms: Secondary | ICD-10-CM | POA: Diagnosis not present

## 2020-11-12 DIAGNOSIS — R739 Hyperglycemia, unspecified: Secondary | ICD-10-CM | POA: Diagnosis not present

## 2020-12-16 NOTE — Progress Notes (Signed)
Electrophysiology Office Note Date: 12/16/2020  ID:  George [name], George [name] [birth year], MRN [id number]  PCP: [name], MD Primary Cardiologist: None Electrophysiologist: Thompson Grayer, MD   CC: Pacemaker follow-up  George [name] is a 80 y.o. male seen today for Thompson Grayer, MD for routine electrophysiology followup.  Since last being seen in our clinic the patient reports doing very well.  he denies chest pain, palpitations, dyspnea, PND, orthopnea, nausea, vomiting, dizziness, syncope, edema, weight gain, or early satiety.  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 s/p MDT dual chamber PPM    Small bowel obstruction ([place])    Past Surgical History:  Procedure Laterality Date   APPENDECTOMY  09/2003   BASAL CELL CARCINOMA EXCISION     nose   COLECTOMY Right 09/2003   Archie Endo 10/27/2010   COLON SURGERY     EP IMPLANTABLE DEVICE N/A 04/05/2016   Procedure: Loop Recorder Insertion;  Surgeon: Thompson Grayer, MD;  Location: [facility];  Service: Cardiovascular;  Laterality: N/A;   EP IMPLANTABLE DEVICE N/A 07/08/2016   MDT Azure XT DR implanted by Dr Curt Bears for sick sinus syndrome/ sinus pauses   EP IMPLANTABLE DEVICE N/A 07/08/2016   Procedure: Loop Recorder Removal;  Surgeon: Will Meredith Leeds, MD;  Location: [facility];  Service: Cardiovascular;  Laterality: N/A;   TONSILLECTOMY     TOTAL KNEE ARTHROPLASTY Right 03/03/2014   Procedure: RIGHT TOTAL KNEE ARTHROPLASTY;  Surgeon: Gearlean Alf, MD;  [facility];  Service: Orthopedics;  Laterality: Right;   TOTAL KNEE ARTHROPLASTY Left 01/2005   Archie Endo 10/27/2010    Current Outpatient Medications  Medication Sig Dispense  Refill   ANDROGEL PUMP 20.25 MG/ACT (1.62%) GEL Apply 1 application topically daily.      aspirin EC 81 MG tablet Take 81 mg by mouth daily.     benzonatate (TESSALON) 100 MG capsule Take 1 capsule (100 mg total) by mouth every 8 (eight) hours. 21 capsule 0   buPROPion (WELLBUTRIN XL) 150 MG 24 hr tablet Take 450 mg by mouth every morning.     celecoxib (CELEBREX) 200 MG capsule Take 200 mg by mouth every other day.      citalopram (CELEXA) 20 MG tablet Take 1 tablet by mouth daily.     finasteride (PROSCAR) 5 MG tablet Take 5 mg by mouth every evening.      folic acid (FOLVITE) 1 MG tablet Take 1 mg by mouth daily.     Multiple Vitamin (MULTIVITAMIN WITH MINERALS) TABS tablet Take 1 tablet by mouth daily.     pantoprazole (PROTONIX) 40 MG tablet Take 40 mg by mouth daily.     propranolol (INDERAL) 20 MG tablet Take 20 mg by mouth daily.     sodium chloride (OCEAN) 0.65 % SOLN nasal spray Place 1 spray into both nostrils as needed for congestion. 15 mL 0   tadalafil (CIALIS) 5 MG tablet Take by mouth as needed.     tamsulosin (FLOMAX) 0.4 MG CAPS capsule Take 0.4 mg by mouth daily.     No current facility-administered medications for this visit.    Allergies:   Patient  has no known allergies.   Social History: Social History   Socioeconomic History   Marital status: Married    Spouse name: [name]   Number of children: Not on file   Years of education: Not on file   Highest education level: Not on file  Occupational History   Not on file  Tobacco Use   Smoking status: Former    Packs/day: 2.00    Years: 20.00    Pack years: 40.00    Types: Cigarettes    Quit date: 1984    Years since quitting: 38.5   Smokeless tobacco: Never  Vaping Use   Vaping Use: Never used  Substance and Sexual Activity   Alcohol use: Yes    Alcohol/week: 6.0 standard drinks    Types: 6 Cans of beer per week   Drug use: No   Sexual activity: Not on file  Other Topics Concern   Not on file  Social  History Narrative   Lives with wife    Right Handed   Drinks 2-3 cups caffeine daily   Social Determinants of Health   Financial Resource Strain: Not on file  Food Insecurity: Not on file  Transportation Needs: Not on file  Physical Activity: Not on file  Stress: Not on file  Social Connections: Not on file  Intimate Partner Violence: Not on file    Family History: Family History  Problem Relation Age of Onset   Cancer Father    Diabetes Mellitus II Mother      Review of Systems: All other systems reviewed and are otherwise negative except as noted above.  Physical Exam: There were no vitals filed for this visit.   GEN- The patient is well appearing, alert and oriented x 3 today.   HEENT: normocephalic, atraumatic; sclera clear, conjunctiva pink; hearing intact; oropharynx clear; neck supple  Lungs- Clear to ausculation bilaterally, normal work of breathing.  No wheezes, rales, rhonchi Heart- Regular rate and rhythm, no murmurs, rubs or gallops  GI- soft, non-tender, non-distended, bowel sounds present  Extremities- no clubbing or cyanosis. No edema MS- no significant deformity or atrophy Skin- warm and dry, no rash or lesion; PPM pocket well healed Psych- euthymic mood, full affect Neuro- strength and sensation are intact  PPM Interrogation- reviewed in detail today,  See PACEART report  EKG:  EKG is ordered today. The ekg ordered today shows NSR at 79 bpm  Recent Labs: 03/27/2020: BUN 16; Creatinine, Ser 1.04; Hemoglobin 15.3; Platelets 191; Potassium 3.8; Sodium 138 04/16/2020: TSH 1.190   Wt Readings from Last 3 Encounters:  07/22/20 177 lb 6.4 oz (80.5 kg)  04/16/20 167 lb 12.8 oz (76.1 kg)  12/13/19 171 lb (77.6 kg)     Other studies Reviewed: Additional studies/ records that were reviewed today include: Previous EP office notes, Previous remote checks, Most recent labwork.   Assessment and Plan:  1. Sick sinus syndrome s/p Medtronic PPM  Normal PPM  function See Claudia Desanctis Art report No changes today   Current medicines are reviewed at length with the patient today.   The patient does not have concerns regarding his medicines.  The following changes were made today:  none  Labs/ tests ordered today include:  Will obtain recent labs from PCP.   Disposition:   Follow up with Dr. Allred/EP APP in 12 Months    Signed, Annamaria Helling  12/16/2020 8:27 PM  [place] [address] [phone] (office) [phone] (  fax)

## 2020-12-17 ENCOUNTER — Encounter: Payer: Self-pay | Admitting: Student

## 2020-12-17 ENCOUNTER — Other Ambulatory Visit: Payer: Self-pay

## 2020-12-17 ENCOUNTER — Ambulatory Visit (INDEPENDENT_AMBULATORY_CARE_PROVIDER_SITE_OTHER): Payer: Medicare Other | Admitting: Student

## 2020-12-17 VITALS — BP 130/70 | HR 79 | Ht 68.0 in | Wt 177.0 lb

## 2020-12-17 DIAGNOSIS — I495 Sick sinus syndrome: Secondary | ICD-10-CM | POA: Diagnosis not present

## 2020-12-17 DIAGNOSIS — I459 Conduction disorder, unspecified: Secondary | ICD-10-CM

## 2020-12-17 LAB — CUP PACEART INCLINIC DEVICE CHECK
Battery Remaining Longevity: 130 mo
Battery Voltage: 3.02 V
Brady Statistic AP VP Percent: 0.01 %
Brady Statistic AP VS Percent: 0.61 %
Brady Statistic AS VP Percent: 0.03 %
Brady Statistic AS VS Percent: 99.35 %
Brady Statistic RA Percent Paced: 0.69 %
Brady Statistic RV Percent Paced: 0.04 %
Date Time Interrogation Session: 20220707130621
Implantable Lead Implant Date: 20180126
Implantable Lead Implant Date: 20180126
Implantable Lead Location: 753859
Implantable Lead Location: 753860
Implantable Lead Model: 5076
Implantable Lead Model: 5076
Implantable Pulse Generator Implant Date: 20180126
Lead Channel Impedance Value: 323 Ohm
Lead Channel Impedance Value: 342 Ohm
Lead Channel Impedance Value: 399 Ohm
Lead Channel Impedance Value: 456 Ohm
Lead Channel Pacing Threshold Amplitude: 0.625 V
Lead Channel Pacing Threshold Amplitude: 0.75 V
Lead Channel Pacing Threshold Pulse Width: 0.4 ms
Lead Channel Pacing Threshold Pulse Width: 0.4 ms
Lead Channel Sensing Intrinsic Amplitude: 2.375 mV
Lead Channel Sensing Intrinsic Amplitude: 2.875 mV
Lead Channel Sensing Intrinsic Amplitude: 3.875 mV
Lead Channel Sensing Intrinsic Amplitude: 5 mV
Lead Channel Setting Pacing Amplitude: 2 V
Lead Channel Setting Pacing Amplitude: 2.5 V
Lead Channel Setting Pacing Pulse Width: 0.4 ms
Lead Channel Setting Sensing Sensitivity: 2 mV

## 2020-12-25 DIAGNOSIS — Z743 Need for continuous supervision: Secondary | ICD-10-CM | POA: Diagnosis not present

## 2020-12-25 DIAGNOSIS — K56609 Unspecified intestinal obstruction, unspecified as to partial versus complete obstruction: Secondary | ICD-10-CM | POA: Diagnosis not present

## 2020-12-25 DIAGNOSIS — Z9049 Acquired absence of other specified parts of digestive tract: Secondary | ICD-10-CM | POA: Diagnosis not present

## 2020-12-25 DIAGNOSIS — I10 Essential (primary) hypertension: Secondary | ICD-10-CM | POA: Diagnosis not present

## 2020-12-25 DIAGNOSIS — K219 Gastro-esophageal reflux disease without esophagitis: Secondary | ICD-10-CM | POA: Diagnosis not present

## 2020-12-25 DIAGNOSIS — K5651 Intestinal adhesions [bands], with partial obstruction: Secondary | ICD-10-CM | POA: Diagnosis not present

## 2020-12-25 DIAGNOSIS — R1900 Intra-abdominal and pelvic swelling, mass and lump, unspecified site: Secondary | ICD-10-CM | POA: Diagnosis not present

## 2020-12-25 DIAGNOSIS — E860 Dehydration: Secondary | ICD-10-CM | POA: Diagnosis not present

## 2020-12-26 DIAGNOSIS — K5900 Constipation, unspecified: Secondary | ICD-10-CM | POA: Diagnosis not present

## 2020-12-26 DIAGNOSIS — K5730 Diverticulosis of large intestine without perforation or abscess without bleeding: Secondary | ICD-10-CM | POA: Diagnosis not present

## 2020-12-26 DIAGNOSIS — K5901 Slow transit constipation: Secondary | ICD-10-CM | POA: Diagnosis not present

## 2020-12-26 DIAGNOSIS — K5651 Intestinal adhesions [bands], with partial obstruction: Secondary | ICD-10-CM | POA: Diagnosis present

## 2020-12-26 DIAGNOSIS — K760 Fatty (change of) liver, not elsewhere classified: Secondary | ICD-10-CM | POA: Diagnosis not present

## 2020-12-26 DIAGNOSIS — I10 Essential (primary) hypertension: Secondary | ICD-10-CM | POA: Diagnosis present

## 2020-12-26 DIAGNOSIS — K219 Gastro-esophageal reflux disease without esophagitis: Secondary | ICD-10-CM | POA: Diagnosis present

## 2020-12-26 DIAGNOSIS — K565 Intestinal adhesions [bands], unspecified as to partial versus complete obstruction: Secondary | ICD-10-CM | POA: Diagnosis not present

## 2020-12-26 DIAGNOSIS — K6389 Other specified diseases of intestine: Secondary | ICD-10-CM | POA: Diagnosis not present

## 2020-12-26 DIAGNOSIS — R112 Nausea with vomiting, unspecified: Secondary | ICD-10-CM | POA: Diagnosis not present

## 2020-12-26 DIAGNOSIS — E860 Dehydration: Secondary | ICD-10-CM | POA: Diagnosis not present

## 2020-12-26 DIAGNOSIS — R140 Abdominal distension (gaseous): Secondary | ICD-10-CM | POA: Diagnosis not present

## 2020-12-26 DIAGNOSIS — Z9049 Acquired absence of other specified parts of digestive tract: Secondary | ICD-10-CM | POA: Diagnosis not present

## 2021-01-05 ENCOUNTER — Ambulatory Visit (INDEPENDENT_AMBULATORY_CARE_PROVIDER_SITE_OTHER): Payer: Medicare Other

## 2021-01-05 DIAGNOSIS — I495 Sick sinus syndrome: Secondary | ICD-10-CM

## 2021-01-05 LAB — CUP PACEART REMOTE DEVICE CHECK
Battery Remaining Longevity: 130 mo
Battery Voltage: 3.02 V
Brady Statistic AP VP Percent: 0.01 %
Brady Statistic AP VS Percent: 0.5 %
Brady Statistic AS VP Percent: 0.03 %
Brady Statistic AS VS Percent: 99.45 %
Brady Statistic RA Percent Paced: 0.57 %
Brady Statistic RV Percent Paced: 0.05 %
Date Time Interrogation Session: 20220726020627
Implantable Lead Implant Date: 20180126
Implantable Lead Implant Date: 20180126
Implantable Lead Location: 753859
Implantable Lead Location: 753860
Implantable Lead Model: 5076
Implantable Lead Model: 5076
Implantable Pulse Generator Implant Date: 20180126
Lead Channel Impedance Value: 304 Ohm
Lead Channel Impedance Value: 323 Ohm
Lead Channel Impedance Value: 399 Ohm
Lead Channel Impedance Value: 456 Ohm
Lead Channel Pacing Threshold Amplitude: 0.625 V
Lead Channel Pacing Threshold Amplitude: 0.75 V
Lead Channel Pacing Threshold Pulse Width: 0.4 ms
Lead Channel Pacing Threshold Pulse Width: 0.4 ms
Lead Channel Sensing Intrinsic Amplitude: 2.375 mV
Lead Channel Sensing Intrinsic Amplitude: 2.375 mV
Lead Channel Sensing Intrinsic Amplitude: 4.75 mV
Lead Channel Sensing Intrinsic Amplitude: 4.75 mV
Lead Channel Setting Pacing Amplitude: 2 V
Lead Channel Setting Pacing Amplitude: 2.5 V
Lead Channel Setting Pacing Pulse Width: 0.4 ms
Lead Channel Setting Sensing Sensitivity: 2 mV

## 2021-02-01 NOTE — Progress Notes (Signed)
Remote pacemaker transmission.   

## 2021-02-23 DIAGNOSIS — D2372 Other benign neoplasm of skin of left lower limb, including hip: Secondary | ICD-10-CM | POA: Diagnosis not present

## 2021-02-23 DIAGNOSIS — Z85828 Personal history of other malignant neoplasm of skin: Secondary | ICD-10-CM | POA: Diagnosis not present

## 2021-02-23 DIAGNOSIS — L821 Other seborrheic keratosis: Secondary | ICD-10-CM | POA: Diagnosis not present

## 2021-02-23 DIAGNOSIS — L578 Other skin changes due to chronic exposure to nonionizing radiation: Secondary | ICD-10-CM | POA: Diagnosis not present

## 2021-02-23 DIAGNOSIS — E882 Lipomatosis, not elsewhere classified: Secondary | ICD-10-CM | POA: Diagnosis not present

## 2021-02-23 DIAGNOSIS — X32XXXS Exposure to sunlight, sequela: Secondary | ICD-10-CM | POA: Diagnosis not present

## 2021-02-23 DIAGNOSIS — L814 Other melanin hyperpigmentation: Secondary | ICD-10-CM | POA: Diagnosis not present

## 2021-02-23 DIAGNOSIS — L570 Actinic keratosis: Secondary | ICD-10-CM | POA: Diagnosis not present

## 2021-02-23 DIAGNOSIS — L905 Scar conditions and fibrosis of skin: Secondary | ICD-10-CM | POA: Diagnosis not present

## 2021-02-23 DIAGNOSIS — D1801 Hemangioma of skin and subcutaneous tissue: Secondary | ICD-10-CM | POA: Diagnosis not present

## 2021-04-06 ENCOUNTER — Ambulatory Visit (INDEPENDENT_AMBULATORY_CARE_PROVIDER_SITE_OTHER): Payer: Medicare Other

## 2021-04-06 DIAGNOSIS — I495 Sick sinus syndrome: Secondary | ICD-10-CM | POA: Diagnosis not present

## 2021-04-06 LAB — CUP PACEART REMOTE DEVICE CHECK
Battery Remaining Longevity: 127 mo
Battery Voltage: 3.02 V
Brady Statistic AP VP Percent: 0.02 %
Brady Statistic AP VS Percent: 0.35 %
Brady Statistic AS VP Percent: 0.03 %
Brady Statistic AS VS Percent: 99.6 %
Brady Statistic RA Percent Paced: 0.37 %
Brady Statistic RV Percent Paced: 0.05 %
Date Time Interrogation Session: 20221025015509
Implantable Lead Implant Date: 20180126
Implantable Lead Implant Date: 20180126
Implantable Lead Location: 753859
Implantable Lead Location: 753860
Implantable Lead Model: 5076
Implantable Lead Model: 5076
Implantable Pulse Generator Implant Date: 20180126
Lead Channel Impedance Value: 304 Ohm
Lead Channel Impedance Value: 323 Ohm
Lead Channel Impedance Value: 399 Ohm
Lead Channel Impedance Value: 418 Ohm
Lead Channel Pacing Threshold Amplitude: 0.625 V
Lead Channel Pacing Threshold Amplitude: 0.75 V
Lead Channel Pacing Threshold Pulse Width: 0.4 ms
Lead Channel Pacing Threshold Pulse Width: 0.4 ms
Lead Channel Sensing Intrinsic Amplitude: 2.5 mV
Lead Channel Sensing Intrinsic Amplitude: 2.5 mV
Lead Channel Sensing Intrinsic Amplitude: 4.5 mV
Lead Channel Sensing Intrinsic Amplitude: 4.5 mV
Lead Channel Setting Pacing Amplitude: 2 V
Lead Channel Setting Pacing Amplitude: 2.5 V
Lead Channel Setting Pacing Pulse Width: 0.4 ms
Lead Channel Setting Sensing Sensitivity: 2 mV

## 2021-04-07 DIAGNOSIS — Z03818 Encounter for observation for suspected exposure to other biological agents ruled out: Secondary | ICD-10-CM | POA: Diagnosis not present

## 2021-04-07 DIAGNOSIS — J029 Acute pharyngitis, unspecified: Secondary | ICD-10-CM | POA: Diagnosis not present

## 2021-04-07 DIAGNOSIS — R059 Cough, unspecified: Secondary | ICD-10-CM | POA: Diagnosis not present

## 2021-04-07 DIAGNOSIS — B349 Viral infection, unspecified: Secondary | ICD-10-CM | POA: Diagnosis not present

## 2021-04-14 DIAGNOSIS — R058 Other specified cough: Secondary | ICD-10-CM | POA: Diagnosis not present

## 2021-04-14 DIAGNOSIS — J159 Unspecified bacterial pneumonia: Secondary | ICD-10-CM | POA: Diagnosis not present

## 2021-04-14 NOTE — Progress Notes (Signed)
Remote pacemaker transmission.   

## 2021-04-18 DIAGNOSIS — R197 Diarrhea, unspecified: Secondary | ICD-10-CM | POA: Diagnosis not present

## 2021-04-18 DIAGNOSIS — J209 Acute bronchitis, unspecified: Secondary | ICD-10-CM | POA: Diagnosis not present

## 2021-04-19 DIAGNOSIS — R197 Diarrhea, unspecified: Secondary | ICD-10-CM | POA: Diagnosis not present

## 2021-05-14 DIAGNOSIS — N401 Enlarged prostate with lower urinary tract symptoms: Secondary | ICD-10-CM | POA: Diagnosis not present

## 2021-05-14 DIAGNOSIS — Z23 Encounter for immunization: Secondary | ICD-10-CM | POA: Diagnosis not present

## 2021-05-14 DIAGNOSIS — R251 Tremor, unspecified: Secondary | ICD-10-CM | POA: Diagnosis not present

## 2021-05-14 DIAGNOSIS — Z0000 Encounter for general adult medical examination without abnormal findings: Secondary | ICD-10-CM | POA: Diagnosis not present

## 2021-05-14 DIAGNOSIS — K219 Gastro-esophageal reflux disease without esophagitis: Secondary | ICD-10-CM | POA: Diagnosis not present

## 2021-05-14 DIAGNOSIS — E291 Testicular hypofunction: Secondary | ICD-10-CM | POA: Diagnosis not present

## 2021-05-14 DIAGNOSIS — F3341 Major depressive disorder, recurrent, in partial remission: Secondary | ICD-10-CM | POA: Diagnosis not present

## 2021-05-18 DIAGNOSIS — R350 Frequency of micturition: Secondary | ICD-10-CM | POA: Diagnosis not present

## 2021-05-18 DIAGNOSIS — R3912 Poor urinary stream: Secondary | ICD-10-CM | POA: Diagnosis not present

## 2021-05-18 DIAGNOSIS — N138 Other obstructive and reflux uropathy: Secondary | ICD-10-CM | POA: Diagnosis not present

## 2021-05-18 DIAGNOSIS — N5201 Erectile dysfunction due to arterial insufficiency: Secondary | ICD-10-CM | POA: Diagnosis not present

## 2021-05-18 DIAGNOSIS — N401 Enlarged prostate with lower urinary tract symptoms: Secondary | ICD-10-CM | POA: Diagnosis not present

## 2021-05-18 DIAGNOSIS — N529 Male erectile dysfunction, unspecified: Secondary | ICD-10-CM | POA: Diagnosis not present

## 2021-05-18 DIAGNOSIS — R351 Nocturia: Secondary | ICD-10-CM | POA: Diagnosis not present

## 2021-05-18 DIAGNOSIS — R7989 Other specified abnormal findings of blood chemistry: Secondary | ICD-10-CM | POA: Diagnosis not present

## 2021-05-18 DIAGNOSIS — E291 Testicular hypofunction: Secondary | ICD-10-CM | POA: Diagnosis not present

## 2021-05-18 DIAGNOSIS — R3915 Urgency of urination: Secondary | ICD-10-CM | POA: Diagnosis not present

## 2021-05-18 DIAGNOSIS — Z87898 Personal history of other specified conditions: Secondary | ICD-10-CM | POA: Diagnosis not present

## 2021-05-31 ENCOUNTER — Telehealth: Payer: Self-pay

## 2021-05-31 NOTE — Telephone Encounter (Signed)
Remote transmission received and reviewed. Normal device function. No events noted. Spoke to patient, advised to call PCP to speak about his symptoms and reassured his transmission does not show anything that correlates with his symptoms. Patient verbalized understanding and agreeable with plan.

## 2021-05-31 NOTE — Telephone Encounter (Signed)
The been having dizzy spells for the past two weeks. His face been kind of flushed. I asked the patient to send a manual transmission with his home remote monitor. Transmission received.

## 2021-07-06 ENCOUNTER — Ambulatory Visit (INDEPENDENT_AMBULATORY_CARE_PROVIDER_SITE_OTHER): Payer: Medicare Other

## 2021-07-06 DIAGNOSIS — I495 Sick sinus syndrome: Secondary | ICD-10-CM | POA: Diagnosis not present

## 2021-07-06 LAB — CUP PACEART REMOTE DEVICE CHECK
Battery Remaining Longevity: 124 mo
Battery Voltage: 3.02 V
Brady Statistic AP VP Percent: 0.02 %
Brady Statistic AP VS Percent: 1.24 %
Brady Statistic AS VP Percent: 0.04 %
Brady Statistic AS VS Percent: 98.7 %
Brady Statistic RA Percent Paced: 1.62 %
Brady Statistic RV Percent Paced: 0.05 %
Date Time Interrogation Session: 20230124005524
Implantable Lead Implant Date: 20180126
Implantable Lead Implant Date: 20180126
Implantable Lead Location: 753859
Implantable Lead Location: 753860
Implantable Lead Model: 5076
Implantable Lead Model: 5076
Implantable Pulse Generator Implant Date: 20180126
Lead Channel Impedance Value: 304 Ohm
Lead Channel Impedance Value: 323 Ohm
Lead Channel Impedance Value: 380 Ohm
Lead Channel Impedance Value: 399 Ohm
Lead Channel Pacing Threshold Amplitude: 0.625 V
Lead Channel Pacing Threshold Amplitude: 0.875 V
Lead Channel Pacing Threshold Pulse Width: 0.4 ms
Lead Channel Pacing Threshold Pulse Width: 0.4 ms
Lead Channel Sensing Intrinsic Amplitude: 2.25 mV
Lead Channel Sensing Intrinsic Amplitude: 2.25 mV
Lead Channel Sensing Intrinsic Amplitude: 4.125 mV
Lead Channel Sensing Intrinsic Amplitude: 4.125 mV
Lead Channel Setting Pacing Amplitude: 2 V
Lead Channel Setting Pacing Amplitude: 2.5 V
Lead Channel Setting Pacing Pulse Width: 0.4 ms
Lead Channel Setting Sensing Sensitivity: 2 mV

## 2021-07-16 NOTE — Progress Notes (Signed)
Remote pacemaker transmission.   

## 2021-08-19 DIAGNOSIS — L820 Inflamed seborrheic keratosis: Secondary | ICD-10-CM | POA: Diagnosis not present

## 2021-10-05 ENCOUNTER — Ambulatory Visit (INDEPENDENT_AMBULATORY_CARE_PROVIDER_SITE_OTHER): Payer: Medicare Other

## 2021-10-05 DIAGNOSIS — I495 Sick sinus syndrome: Secondary | ICD-10-CM | POA: Diagnosis not present

## 2021-10-05 LAB — CUP PACEART REMOTE DEVICE CHECK
Battery Remaining Longevity: 121 mo
Battery Voltage: 3.01 V
Brady Statistic AP VP Percent: 0.02 %
Brady Statistic AP VS Percent: 0.71 %
Brady Statistic AS VP Percent: 0.03 %
Brady Statistic AS VS Percent: 99.24 %
Brady Statistic RA Percent Paced: 0.86 %
Brady Statistic RV Percent Paced: 0.05 %
Date Time Interrogation Session: 20230425015533
Implantable Lead Implant Date: 20180126
Implantable Lead Implant Date: 20180126
Implantable Lead Location: 753859
Implantable Lead Location: 753860
Implantable Lead Model: 5076
Implantable Lead Model: 5076
Implantable Pulse Generator Implant Date: 20180126
Lead Channel Impedance Value: 285 Ohm
Lead Channel Impedance Value: 304 Ohm
Lead Channel Impedance Value: 380 Ohm
Lead Channel Impedance Value: 399 Ohm
Lead Channel Pacing Threshold Amplitude: 0.625 V
Lead Channel Pacing Threshold Amplitude: 0.75 V
Lead Channel Pacing Threshold Pulse Width: 0.4 ms
Lead Channel Pacing Threshold Pulse Width: 0.4 ms
Lead Channel Sensing Intrinsic Amplitude: 2 mV
Lead Channel Sensing Intrinsic Amplitude: 2 mV
Lead Channel Sensing Intrinsic Amplitude: 3.5 mV
Lead Channel Sensing Intrinsic Amplitude: 3.5 mV
Lead Channel Setting Pacing Amplitude: 2 V
Lead Channel Setting Pacing Amplitude: 2.5 V
Lead Channel Setting Pacing Pulse Width: 0.4 ms
Lead Channel Setting Sensing Sensitivity: 2 mV

## 2021-10-18 ENCOUNTER — Other Ambulatory Visit (HOSPITAL_BASED_OUTPATIENT_CLINIC_OR_DEPARTMENT_OTHER): Payer: Self-pay

## 2021-10-18 ENCOUNTER — Encounter (HOSPITAL_BASED_OUTPATIENT_CLINIC_OR_DEPARTMENT_OTHER): Payer: Self-pay | Admitting: Emergency Medicine

## 2021-10-18 ENCOUNTER — Emergency Department (HOSPITAL_BASED_OUTPATIENT_CLINIC_OR_DEPARTMENT_OTHER)
Admission: EM | Admit: 2021-10-18 | Discharge: 2021-10-18 | Disposition: A | Discharge status: Home / Self Care | Payer: Medicare Other | Attending: Emergency Medicine | Admitting: Emergency Medicine

## 2021-10-18 ENCOUNTER — Other Ambulatory Visit: Payer: Self-pay

## 2021-10-18 ENCOUNTER — Emergency Department (HOSPITAL_BASED_OUTPATIENT_CLINIC_OR_DEPARTMENT_OTHER): Payer: Medicare Other

## 2021-10-18 DIAGNOSIS — M5459 Other low back pain: Secondary | ICD-10-CM | POA: Diagnosis not present

## 2021-10-18 DIAGNOSIS — M545 Low back pain, unspecified: Secondary | ICD-10-CM | POA: Insufficient documentation

## 2021-10-18 DIAGNOSIS — M47816 Spondylosis without myelopathy or radiculopathy, lumbar region: Secondary | ICD-10-CM | POA: Diagnosis not present

## 2021-10-18 MED ORDER — CYCLOBENZAPRINE HCL 10 MG PO TABS
10.0000 mg | ORAL_TABLET | Freq: Three times a day (TID) | ORAL | 0 refills | Status: DC
  Filled 2021-10-18: qty 20, 7d supply, fill #0

## 2021-10-18 MED ORDER — TRAMADOL HCL 50 MG PO TABS
50.0000 mg | ORAL_TABLET | Freq: Four times a day (QID) | ORAL | 0 refills | Status: AC | PRN
  Filled 2021-10-18: qty 15, 4d supply, fill #0

## 2021-10-18 NOTE — ED Notes (Signed)
Pt having lower back pain since playing golf and swings  with club x several days ?

## 2021-10-18 NOTE — ED Triage Notes (Signed)
Lower back pain on right side radiates to right buttocks denies any pain in legs. Denies injury, denies falls. ?

## 2021-10-18 NOTE — Discharge Instructions (Signed)
X-ray of the lumbar back showed no bony abnormalities or any fractures.  Take the tramadol as directed take the Flexeril as directed.  Make an appointment to follow-up with orthopedics.  Fully everything will resolve over the next 2 weeks.  And follow-up would not be necessary.  Avoid any heavy lifting also avoid any swings until better. ?

## 2021-10-18 NOTE — ED Provider Notes (Signed)
?[facility] ?Provider Note ? ? ?CSN: [id number] ?Arrival date & time: 10/18/21  1128 ? ?  ? ?History ? ?Chief Complaint  ?Patient presents with  ? Back Pain  ? ? ?DANNELL GORTNEY is a 81 y.o. male. ? ?Patient was hitting golf balls on Saturday.  Got pain in his right buttocks area right low back area.  Nonradiating but sometimes with certain movements the pain is quite sharp and it feels like his right leg is going to give out.  No prior history of any thing similar.  No known direct injury.  No numbness or weakness to the right foot or the left foot.  No other symptoms. ? ?Patient is followed by orthopedics he has had some total knee replacement on the right side.  Past medical history is significant for small bowel obstruction arthritis and sick sinus syndrome has a dual-chamber pacemaker.  He had catheterization in 2017 total knee arthroplasty in 2015 and repeat cardiac catheterization in January 2018. ? ? ?  ? ?Home Medications ?Prior to Admission medications   ?Medication Sig Start Date End Date Taking? Authorizing Provider  ?cyclobenzaprine (FLEXERIL) 10 MG tablet Take 1 tablet (10 mg total) by mouth 3 (three) times daily. 10/18/21  Yes Fredia Sorrow, MD  ?traMADol (ULTRAM) 50 MG tablet Take 1 tablet (50 mg total) by mouth every 6 (six) hours as needed. 10/18/21  Yes Fredia Sorrow, MD  ?amoxicillin (AMOXIL) 500 MG capsule Take 500 mg by mouth 3 (three) times daily. 11/12/20   [provider]  ?ANDROGEL PUMP 20.25 MG/ACT (1.62%) GEL Apply 1 application topically daily.  09/08/14   [provider]  ?buPROPion (WELLBUTRIN XL) 150 MG 24 hr tablet Take 450 mg by mouth every morning.    [provider]  ?buPROPion (WELLBUTRIN XL) 150 MG 24 hr tablet Take by mouth.    [provider]  ?celecoxib (CELEBREX) 200 MG capsule Take 200 mg by mouth every other day.     [provider]  ?cephALEXin (KEFLEX) 500 MG capsule Take 500 mg by mouth 4  (four) times daily. 11/02/20   [provider]  ?citalopram (CELEXA) 20 MG tablet Take 1 tablet by mouth daily. 11/03/17   [provider]  ?doxycycline (VIBRA-TABS) 100 MG tablet Take 100 mg by mouth daily. 08/13/20   [provider]  ?finasteride (PROSCAR) 5 MG tablet Take 5 mg by mouth every evening.     [provider]  ?Multiple Vitamin (MULTIVITAMIN WITH MINERALS) TABS tablet Take 1 tablet by mouth daily.    [provider]  ?pantoprazole (PROTONIX) 40 MG tablet Take 40 mg by mouth daily.    [provider]  ?propranolol (INDERAL) 20 MG tablet Take 20 mg by mouth daily.    [provider]  ?sodium chloride (OCEAN) 0.65 % SOLN nasal spray Place 1 spray into both nostrils as needed for congestion. 06/16/18   Frederica Kuster, PA-C  ?tadalafil (CIALIS) 5 MG tablet Take by mouth as needed. 12/12/18   [provider]  ?tamsulosin (FLOMAX) 0.4 MG CAPS capsule Take 0.4 mg by mouth daily.    [provider]  ?   ? ?Allergies    ?Patient has no known allergies.   ? ?Review of Systems   ?Review of Systems  ?Constitutional:  Negative for chills and fever.  ?HENT:  Negative for ear pain and sore throat.   ?Eyes:  Negative for pain and visual disturbance.  ?Respiratory:  Negative for  cough and shortness of breath.   ?Cardiovascular:  Negative for chest pain and palpitations.  ?Gastrointestinal:  Negative for abdominal pain and vomiting.  ?Genitourinary:  Negative for difficulty urinating, dysuria and hematuria.  ?Musculoskeletal:  Positive for back pain. Negative for arthralgias.  ?Skin:  Negative for color change and rash.  ?Neurological:  Negative for seizures, syncope, weakness and numbness.  ?All other systems reviewed and are negative. ? ?Physical Exam ?Updated Vital Signs ?BP (!) 141/74 (BP Location: Left Arm)   Pulse 70   Temp 97.7 ?F (36.5 ?C) (Oral)   Resp 18   Ht 1.727 m ('5\' 8"'$ )   Wt 77.1 kg   SpO2 96%   BMI 25.85 kg/m?  ?Physical  Exam ?Vitals and nursing note reviewed.  ?Constitutional:   ?   General: He is not in acute distress. ?   Appearance: He is well-developed.  ?HENT:  ?   Head: Normocephalic and atraumatic.  ?Eyes:  ?   Conjunctiva/sclera: Conjunctivae normal.  ?Cardiovascular:  ?   Rate and Rhythm: Normal rate and regular rhythm.  ?   Heart sounds: No murmur heard. ?Pulmonary:  ?   Effort: Pulmonary effort is normal. No respiratory distress.  ?   Breath sounds: Normal breath sounds.  ?Abdominal:  ?   Palpations: Abdomen is soft.  ?   Tenderness: There is no abdominal tenderness.  ?Musculoskeletal:     ?   General: Tenderness present. No swelling.  ?   Cervical back: Neck supple.  ?   Comments: Some tenderness to palpation right buttocks area.  Distally neurovascularly intact.  No weakness no numbness to either lower extremity no leg swelling.  ?Skin: ?   General: Skin is warm and dry.  ?   Capillary Refill: Capillary refill takes less than 2 seconds.  ?Neurological:  ?   General: No focal deficit present.  ?   Mental Status: He is alert and oriented to person, place, and time.  ?   Cranial Nerves: No cranial nerve deficit.  ?   Sensory: No sensory deficit.  ?   Motor: No weakness.  ?Psychiatric:     ?   Mood and Affect: Mood normal.  ? ? ?ED Results / Procedures / Treatments   ?Labs ?(all labs ordered are listed, but only abnormal results are displayed) ?Labs Reviewed - No data to display ? ?EKG ?None ? ?Radiology ?DG Lumbar Spine Complete ? ?Result Date: 10/18/2021 ?CLINICAL DATA:  Right-sided low back pain EXAM: LUMBAR SPINE - COMPLETE 4+ VIEW COMPARISON:  03/29/2020 FINDINGS: Five lumbar type vertebral segments. Vertebral body heights are maintained. No fracture identified. Grade 1 anterolisthesis of L5 on S1. Trace retrolisthesis at L2-3. Multilevel intervertebral disc space loss with associated degenerative endplate changes, most pronounced at L2-3 and L3-4. Lower lumbar facet arthrosis. Abdominal aortic atherosclerosis.  IMPRESSION: Moderate multilevel lumbar spondylosis. No acute findings. Electronically Signed   By: Davina Poke D.O.   On: 10/18/2021 14:29   ? ?Procedures ?Procedures  ? ? ?Medications Ordered in ED ?Medications - No data to display ? ?ED Course/ Medical Decision Making/ A&P ?  ?                        ?Medical Decision Making ?Amount and/or Complexity of Data Reviewed ?Radiology: ordered. ? ?Risk ?Prescription drug management. ? ? ?Based on patient's age and the fact that occurred while hitting golf balls.  We will get x-rays of the lumbar back just to rule  out any compression fracture or any abnormalities into the bones.  If negative will have him treated symptomatically and follow-up with his orthopedic doctors. ? ?X-ray lumbar without any acute abnormalities.  Multilevel lumbar spondylosis no acute findings. ? ?Treat patient with tramadol and Flexeril have him follow-up with orthopedics. ? ? ?Final Clinical Impression(s) / ED Diagnoses ?Final diagnoses:  ?Acute right-sided low back pain without sciatica  ? ? ?Rx / DC Orders ?ED Discharge Orders   ? ?      Ordered  ?  traMADol (ULTRAM) 50 MG tablet  Every 6 hours PRN       ? 10/18/21 1512  ?  cyclobenzaprine (FLEXERIL) 10 MG tablet  3 times daily       ? 10/18/21 1512  ? ?  ?  ? ?  ? ? ?  ?Fredia Sorrow, MD ?10/18/21 1512 ? ?

## 2021-10-21 NOTE — Progress Notes (Signed)
Remote pacemaker transmission.   

## 2021-11-01 DIAGNOSIS — M5136 Other intervertebral disc degeneration, lumbar region: Secondary | ICD-10-CM | POA: Diagnosis not present

## 2021-11-01 DIAGNOSIS — M5137 Other intervertebral disc degeneration, lumbosacral region: Secondary | ICD-10-CM | POA: Diagnosis not present

## 2021-11-01 DIAGNOSIS — M9903 Segmental and somatic dysfunction of lumbar region: Secondary | ICD-10-CM | POA: Diagnosis not present

## 2021-11-01 DIAGNOSIS — M4317 Spondylolisthesis, lumbosacral region: Secondary | ICD-10-CM | POA: Diagnosis not present

## 2021-11-01 DIAGNOSIS — Q72891 Other reduction defects of right lower limb: Secondary | ICD-10-CM | POA: Diagnosis not present

## 2021-11-01 DIAGNOSIS — M9905 Segmental and somatic dysfunction of pelvic region: Secondary | ICD-10-CM | POA: Diagnosis not present

## 2021-11-01 DIAGNOSIS — M4316 Spondylolisthesis, lumbar region: Secondary | ICD-10-CM | POA: Diagnosis not present

## 2021-11-01 DIAGNOSIS — M9904 Segmental and somatic dysfunction of sacral region: Secondary | ICD-10-CM | POA: Diagnosis not present

## 2021-11-02 DIAGNOSIS — M4316 Spondylolisthesis, lumbar region: Secondary | ICD-10-CM | POA: Diagnosis not present

## 2021-11-02 DIAGNOSIS — M5137 Other intervertebral disc degeneration, lumbosacral region: Secondary | ICD-10-CM | POA: Diagnosis not present

## 2021-11-02 DIAGNOSIS — Q72891 Other reduction defects of right lower limb: Secondary | ICD-10-CM | POA: Diagnosis not present

## 2021-11-02 DIAGNOSIS — M9904 Segmental and somatic dysfunction of sacral region: Secondary | ICD-10-CM | POA: Diagnosis not present

## 2021-11-02 DIAGNOSIS — M5136 Other intervertebral disc degeneration, lumbar region: Secondary | ICD-10-CM | POA: Diagnosis not present

## 2021-11-02 DIAGNOSIS — M9905 Segmental and somatic dysfunction of pelvic region: Secondary | ICD-10-CM | POA: Diagnosis not present

## 2021-11-02 DIAGNOSIS — M9903 Segmental and somatic dysfunction of lumbar region: Secondary | ICD-10-CM | POA: Diagnosis not present

## 2021-11-02 DIAGNOSIS — M4317 Spondylolisthesis, lumbosacral region: Secondary | ICD-10-CM | POA: Diagnosis not present

## 2021-11-03 DIAGNOSIS — M4317 Spondylolisthesis, lumbosacral region: Secondary | ICD-10-CM | POA: Diagnosis not present

## 2021-11-03 DIAGNOSIS — M9904 Segmental and somatic dysfunction of sacral region: Secondary | ICD-10-CM | POA: Diagnosis not present

## 2021-11-03 DIAGNOSIS — M5137 Other intervertebral disc degeneration, lumbosacral region: Secondary | ICD-10-CM | POA: Diagnosis not present

## 2021-11-03 DIAGNOSIS — M9905 Segmental and somatic dysfunction of pelvic region: Secondary | ICD-10-CM | POA: Diagnosis not present

## 2021-11-03 DIAGNOSIS — M5136 Other intervertebral disc degeneration, lumbar region: Secondary | ICD-10-CM | POA: Diagnosis not present

## 2021-11-03 DIAGNOSIS — M4316 Spondylolisthesis, lumbar region: Secondary | ICD-10-CM | POA: Diagnosis not present

## 2021-11-03 DIAGNOSIS — M9903 Segmental and somatic dysfunction of lumbar region: Secondary | ICD-10-CM | POA: Diagnosis not present

## 2021-11-03 DIAGNOSIS — Q72891 Other reduction defects of right lower limb: Secondary | ICD-10-CM | POA: Diagnosis not present

## 2021-11-04 DIAGNOSIS — M5137 Other intervertebral disc degeneration, lumbosacral region: Secondary | ICD-10-CM | POA: Diagnosis not present

## 2021-11-04 DIAGNOSIS — M4316 Spondylolisthesis, lumbar region: Secondary | ICD-10-CM | POA: Diagnosis not present

## 2021-11-04 DIAGNOSIS — M9905 Segmental and somatic dysfunction of pelvic region: Secondary | ICD-10-CM | POA: Diagnosis not present

## 2021-11-04 DIAGNOSIS — M9903 Segmental and somatic dysfunction of lumbar region: Secondary | ICD-10-CM | POA: Diagnosis not present

## 2021-11-04 DIAGNOSIS — Q72891 Other reduction defects of right lower limb: Secondary | ICD-10-CM | POA: Diagnosis not present

## 2021-11-04 DIAGNOSIS — M5136 Other intervertebral disc degeneration, lumbar region: Secondary | ICD-10-CM | POA: Diagnosis not present

## 2021-11-04 DIAGNOSIS — M4317 Spondylolisthesis, lumbosacral region: Secondary | ICD-10-CM | POA: Diagnosis not present

## 2021-11-04 DIAGNOSIS — M9904 Segmental and somatic dysfunction of sacral region: Secondary | ICD-10-CM | POA: Diagnosis not present

## 2021-11-15 DIAGNOSIS — M5137 Other intervertebral disc degeneration, lumbosacral region: Secondary | ICD-10-CM | POA: Diagnosis not present

## 2021-11-15 DIAGNOSIS — M5136 Other intervertebral disc degeneration, lumbar region: Secondary | ICD-10-CM | POA: Diagnosis not present

## 2021-11-15 DIAGNOSIS — M4316 Spondylolisthesis, lumbar region: Secondary | ICD-10-CM | POA: Diagnosis not present

## 2021-11-15 DIAGNOSIS — Q72891 Other reduction defects of right lower limb: Secondary | ICD-10-CM | POA: Diagnosis not present

## 2021-11-15 DIAGNOSIS — M9904 Segmental and somatic dysfunction of sacral region: Secondary | ICD-10-CM | POA: Diagnosis not present

## 2021-11-15 DIAGNOSIS — M9903 Segmental and somatic dysfunction of lumbar region: Secondary | ICD-10-CM | POA: Diagnosis not present

## 2021-11-15 DIAGNOSIS — M9905 Segmental and somatic dysfunction of pelvic region: Secondary | ICD-10-CM | POA: Diagnosis not present

## 2021-11-15 DIAGNOSIS — M4317 Spondylolisthesis, lumbosacral region: Secondary | ICD-10-CM | POA: Diagnosis not present

## 2021-11-16 DIAGNOSIS — E7800 Pure hypercholesterolemia, unspecified: Secondary | ICD-10-CM | POA: Diagnosis not present

## 2021-11-16 DIAGNOSIS — N401 Enlarged prostate with lower urinary tract symptoms: Secondary | ICD-10-CM | POA: Diagnosis not present

## 2021-11-16 DIAGNOSIS — E291 Testicular hypofunction: Secondary | ICD-10-CM | POA: Diagnosis not present

## 2021-11-16 DIAGNOSIS — F3341 Major depressive disorder, recurrent, in partial remission: Secondary | ICD-10-CM | POA: Diagnosis not present

## 2021-11-16 DIAGNOSIS — M170 Bilateral primary osteoarthritis of knee: Secondary | ICD-10-CM | POA: Diagnosis not present

## 2021-11-16 DIAGNOSIS — K219 Gastro-esophageal reflux disease without esophagitis: Secondary | ICD-10-CM | POA: Diagnosis not present

## 2021-11-17 DIAGNOSIS — M5136 Other intervertebral disc degeneration, lumbar region: Secondary | ICD-10-CM | POA: Diagnosis not present

## 2021-11-17 DIAGNOSIS — M4317 Spondylolisthesis, lumbosacral region: Secondary | ICD-10-CM | POA: Diagnosis not present

## 2021-11-17 DIAGNOSIS — M9905 Segmental and somatic dysfunction of pelvic region: Secondary | ICD-10-CM | POA: Diagnosis not present

## 2021-11-17 DIAGNOSIS — M5137 Other intervertebral disc degeneration, lumbosacral region: Secondary | ICD-10-CM | POA: Diagnosis not present

## 2021-11-17 DIAGNOSIS — M9904 Segmental and somatic dysfunction of sacral region: Secondary | ICD-10-CM | POA: Diagnosis not present

## 2021-11-17 DIAGNOSIS — Q72891 Other reduction defects of right lower limb: Secondary | ICD-10-CM | POA: Diagnosis not present

## 2021-11-17 DIAGNOSIS — M9903 Segmental and somatic dysfunction of lumbar region: Secondary | ICD-10-CM | POA: Diagnosis not present

## 2021-11-17 DIAGNOSIS — M4316 Spondylolisthesis, lumbar region: Secondary | ICD-10-CM | POA: Diagnosis not present

## 2021-11-18 DIAGNOSIS — M9905 Segmental and somatic dysfunction of pelvic region: Secondary | ICD-10-CM | POA: Diagnosis not present

## 2021-11-18 DIAGNOSIS — M4316 Spondylolisthesis, lumbar region: Secondary | ICD-10-CM | POA: Diagnosis not present

## 2021-11-18 DIAGNOSIS — M4317 Spondylolisthesis, lumbosacral region: Secondary | ICD-10-CM | POA: Diagnosis not present

## 2021-11-18 DIAGNOSIS — M5136 Other intervertebral disc degeneration, lumbar region: Secondary | ICD-10-CM | POA: Diagnosis not present

## 2021-11-18 DIAGNOSIS — M9903 Segmental and somatic dysfunction of lumbar region: Secondary | ICD-10-CM | POA: Diagnosis not present

## 2021-11-18 DIAGNOSIS — M9904 Segmental and somatic dysfunction of sacral region: Secondary | ICD-10-CM | POA: Diagnosis not present

## 2021-11-18 DIAGNOSIS — M5137 Other intervertebral disc degeneration, lumbosacral region: Secondary | ICD-10-CM | POA: Diagnosis not present

## 2021-11-18 DIAGNOSIS — Q72891 Other reduction defects of right lower limb: Secondary | ICD-10-CM | POA: Diagnosis not present

## 2021-11-22 DIAGNOSIS — M9903 Segmental and somatic dysfunction of lumbar region: Secondary | ICD-10-CM | POA: Diagnosis not present

## 2021-11-22 DIAGNOSIS — M5137 Other intervertebral disc degeneration, lumbosacral region: Secondary | ICD-10-CM | POA: Diagnosis not present

## 2021-11-22 DIAGNOSIS — M4316 Spondylolisthesis, lumbar region: Secondary | ICD-10-CM | POA: Diagnosis not present

## 2021-11-22 DIAGNOSIS — M9905 Segmental and somatic dysfunction of pelvic region: Secondary | ICD-10-CM | POA: Diagnosis not present

## 2021-11-22 DIAGNOSIS — M4317 Spondylolisthesis, lumbosacral region: Secondary | ICD-10-CM | POA: Diagnosis not present

## 2021-11-22 DIAGNOSIS — Q72891 Other reduction defects of right lower limb: Secondary | ICD-10-CM | POA: Diagnosis not present

## 2021-11-22 DIAGNOSIS — M5136 Other intervertebral disc degeneration, lumbar region: Secondary | ICD-10-CM | POA: Diagnosis not present

## 2021-11-22 DIAGNOSIS — M9904 Segmental and somatic dysfunction of sacral region: Secondary | ICD-10-CM | POA: Diagnosis not present

## 2021-11-24 DIAGNOSIS — M9905 Segmental and somatic dysfunction of pelvic region: Secondary | ICD-10-CM | POA: Diagnosis not present

## 2021-11-24 DIAGNOSIS — M5137 Other intervertebral disc degeneration, lumbosacral region: Secondary | ICD-10-CM | POA: Diagnosis not present

## 2021-11-24 DIAGNOSIS — M4316 Spondylolisthesis, lumbar region: Secondary | ICD-10-CM | POA: Diagnosis not present

## 2021-11-24 DIAGNOSIS — Q72891 Other reduction defects of right lower limb: Secondary | ICD-10-CM | POA: Diagnosis not present

## 2021-11-24 DIAGNOSIS — M9903 Segmental and somatic dysfunction of lumbar region: Secondary | ICD-10-CM | POA: Diagnosis not present

## 2021-11-24 DIAGNOSIS — M5136 Other intervertebral disc degeneration, lumbar region: Secondary | ICD-10-CM | POA: Diagnosis not present

## 2021-11-24 DIAGNOSIS — M4317 Spondylolisthesis, lumbosacral region: Secondary | ICD-10-CM | POA: Diagnosis not present

## 2021-11-24 DIAGNOSIS — M9904 Segmental and somatic dysfunction of sacral region: Secondary | ICD-10-CM | POA: Diagnosis not present

## 2021-11-29 DIAGNOSIS — M4316 Spondylolisthesis, lumbar region: Secondary | ICD-10-CM | POA: Diagnosis not present

## 2021-11-29 DIAGNOSIS — M9903 Segmental and somatic dysfunction of lumbar region: Secondary | ICD-10-CM | POA: Diagnosis not present

## 2021-11-29 DIAGNOSIS — M4317 Spondylolisthesis, lumbosacral region: Secondary | ICD-10-CM | POA: Diagnosis not present

## 2021-11-29 DIAGNOSIS — M9904 Segmental and somatic dysfunction of sacral region: Secondary | ICD-10-CM | POA: Diagnosis not present

## 2021-11-29 DIAGNOSIS — M5137 Other intervertebral disc degeneration, lumbosacral region: Secondary | ICD-10-CM | POA: Diagnosis not present

## 2021-11-29 DIAGNOSIS — Q72891 Other reduction defects of right lower limb: Secondary | ICD-10-CM | POA: Diagnosis not present

## 2021-11-29 DIAGNOSIS — M5136 Other intervertebral disc degeneration, lumbar region: Secondary | ICD-10-CM | POA: Diagnosis not present

## 2021-11-29 DIAGNOSIS — M9905 Segmental and somatic dysfunction of pelvic region: Secondary | ICD-10-CM | POA: Diagnosis not present

## 2021-12-01 DIAGNOSIS — M4317 Spondylolisthesis, lumbosacral region: Secondary | ICD-10-CM | POA: Diagnosis not present

## 2021-12-01 DIAGNOSIS — M9904 Segmental and somatic dysfunction of sacral region: Secondary | ICD-10-CM | POA: Diagnosis not present

## 2021-12-01 DIAGNOSIS — M5136 Other intervertebral disc degeneration, lumbar region: Secondary | ICD-10-CM | POA: Diagnosis not present

## 2021-12-01 DIAGNOSIS — M9905 Segmental and somatic dysfunction of pelvic region: Secondary | ICD-10-CM | POA: Diagnosis not present

## 2021-12-01 DIAGNOSIS — Q72891 Other reduction defects of right lower limb: Secondary | ICD-10-CM | POA: Diagnosis not present

## 2021-12-01 DIAGNOSIS — M4316 Spondylolisthesis, lumbar region: Secondary | ICD-10-CM | POA: Diagnosis not present

## 2021-12-01 DIAGNOSIS — M5137 Other intervertebral disc degeneration, lumbosacral region: Secondary | ICD-10-CM | POA: Diagnosis not present

## 2021-12-01 DIAGNOSIS — M9903 Segmental and somatic dysfunction of lumbar region: Secondary | ICD-10-CM | POA: Diagnosis not present

## 2021-12-02 DIAGNOSIS — Q72891 Other reduction defects of right lower limb: Secondary | ICD-10-CM | POA: Diagnosis not present

## 2021-12-02 DIAGNOSIS — M9903 Segmental and somatic dysfunction of lumbar region: Secondary | ICD-10-CM | POA: Diagnosis not present

## 2021-12-02 DIAGNOSIS — M4317 Spondylolisthesis, lumbosacral region: Secondary | ICD-10-CM | POA: Diagnosis not present

## 2021-12-02 DIAGNOSIS — M5136 Other intervertebral disc degeneration, lumbar region: Secondary | ICD-10-CM | POA: Diagnosis not present

## 2021-12-02 DIAGNOSIS — M5137 Other intervertebral disc degeneration, lumbosacral region: Secondary | ICD-10-CM | POA: Diagnosis not present

## 2021-12-02 DIAGNOSIS — M9905 Segmental and somatic dysfunction of pelvic region: Secondary | ICD-10-CM | POA: Diagnosis not present

## 2021-12-02 DIAGNOSIS — M4316 Spondylolisthesis, lumbar region: Secondary | ICD-10-CM | POA: Diagnosis not present

## 2021-12-02 DIAGNOSIS — M9904 Segmental and somatic dysfunction of sacral region: Secondary | ICD-10-CM | POA: Diagnosis not present

## 2021-12-07 DIAGNOSIS — M5136 Other intervertebral disc degeneration, lumbar region: Secondary | ICD-10-CM | POA: Diagnosis not present

## 2021-12-07 DIAGNOSIS — M5137 Other intervertebral disc degeneration, lumbosacral region: Secondary | ICD-10-CM | POA: Diagnosis not present

## 2021-12-07 DIAGNOSIS — M4317 Spondylolisthesis, lumbosacral region: Secondary | ICD-10-CM | POA: Diagnosis not present

## 2021-12-07 DIAGNOSIS — M9905 Segmental and somatic dysfunction of pelvic region: Secondary | ICD-10-CM | POA: Diagnosis not present

## 2021-12-07 DIAGNOSIS — M9904 Segmental and somatic dysfunction of sacral region: Secondary | ICD-10-CM | POA: Diagnosis not present

## 2021-12-07 DIAGNOSIS — Q72891 Other reduction defects of right lower limb: Secondary | ICD-10-CM | POA: Diagnosis not present

## 2021-12-07 DIAGNOSIS — M9903 Segmental and somatic dysfunction of lumbar region: Secondary | ICD-10-CM | POA: Diagnosis not present

## 2021-12-07 DIAGNOSIS — M4316 Spondylolisthesis, lumbar region: Secondary | ICD-10-CM | POA: Diagnosis not present

## 2021-12-09 DIAGNOSIS — M4316 Spondylolisthesis, lumbar region: Secondary | ICD-10-CM | POA: Diagnosis not present

## 2021-12-09 DIAGNOSIS — Q72891 Other reduction defects of right lower limb: Secondary | ICD-10-CM | POA: Diagnosis not present

## 2021-12-09 DIAGNOSIS — M9903 Segmental and somatic dysfunction of lumbar region: Secondary | ICD-10-CM | POA: Diagnosis not present

## 2021-12-09 DIAGNOSIS — M9905 Segmental and somatic dysfunction of pelvic region: Secondary | ICD-10-CM | POA: Diagnosis not present

## 2021-12-09 DIAGNOSIS — M5137 Other intervertebral disc degeneration, lumbosacral region: Secondary | ICD-10-CM | POA: Diagnosis not present

## 2021-12-09 DIAGNOSIS — M5136 Other intervertebral disc degeneration, lumbar region: Secondary | ICD-10-CM | POA: Diagnosis not present

## 2021-12-09 DIAGNOSIS — M9904 Segmental and somatic dysfunction of sacral region: Secondary | ICD-10-CM | POA: Diagnosis not present

## 2021-12-09 DIAGNOSIS — M4317 Spondylolisthesis, lumbosacral region: Secondary | ICD-10-CM | POA: Diagnosis not present

## 2021-12-13 DIAGNOSIS — M5136 Other intervertebral disc degeneration, lumbar region: Secondary | ICD-10-CM | POA: Diagnosis not present

## 2021-12-13 DIAGNOSIS — M9903 Segmental and somatic dysfunction of lumbar region: Secondary | ICD-10-CM | POA: Diagnosis not present

## 2021-12-13 DIAGNOSIS — Q72891 Other reduction defects of right lower limb: Secondary | ICD-10-CM | POA: Diagnosis not present

## 2021-12-13 DIAGNOSIS — M9905 Segmental and somatic dysfunction of pelvic region: Secondary | ICD-10-CM | POA: Diagnosis not present

## 2021-12-13 DIAGNOSIS — M4316 Spondylolisthesis, lumbar region: Secondary | ICD-10-CM | POA: Diagnosis not present

## 2021-12-13 DIAGNOSIS — M4317 Spondylolisthesis, lumbosacral region: Secondary | ICD-10-CM | POA: Diagnosis not present

## 2021-12-13 DIAGNOSIS — M9904 Segmental and somatic dysfunction of sacral region: Secondary | ICD-10-CM | POA: Diagnosis not present

## 2021-12-13 DIAGNOSIS — M5137 Other intervertebral disc degeneration, lumbosacral region: Secondary | ICD-10-CM | POA: Diagnosis not present

## 2021-12-16 DIAGNOSIS — Q72891 Other reduction defects of right lower limb: Secondary | ICD-10-CM | POA: Diagnosis not present

## 2021-12-16 DIAGNOSIS — M5137 Other intervertebral disc degeneration, lumbosacral region: Secondary | ICD-10-CM | POA: Diagnosis not present

## 2021-12-16 DIAGNOSIS — M9904 Segmental and somatic dysfunction of sacral region: Secondary | ICD-10-CM | POA: Diagnosis not present

## 2021-12-16 DIAGNOSIS — M4317 Spondylolisthesis, lumbosacral region: Secondary | ICD-10-CM | POA: Diagnosis not present

## 2021-12-16 DIAGNOSIS — M9903 Segmental and somatic dysfunction of lumbar region: Secondary | ICD-10-CM | POA: Diagnosis not present

## 2021-12-16 DIAGNOSIS — M5136 Other intervertebral disc degeneration, lumbar region: Secondary | ICD-10-CM | POA: Diagnosis not present

## 2021-12-16 DIAGNOSIS — M9905 Segmental and somatic dysfunction of pelvic region: Secondary | ICD-10-CM | POA: Diagnosis not present

## 2021-12-16 DIAGNOSIS — M4316 Spondylolisthesis, lumbar region: Secondary | ICD-10-CM | POA: Diagnosis not present

## 2021-12-20 DIAGNOSIS — R5381 Other malaise: Secondary | ICD-10-CM | POA: Diagnosis not present

## 2021-12-20 DIAGNOSIS — R0981 Nasal congestion: Secondary | ICD-10-CM | POA: Diagnosis not present

## 2021-12-20 DIAGNOSIS — R5383 Other fatigue: Secondary | ICD-10-CM | POA: Diagnosis not present

## 2021-12-20 DIAGNOSIS — R509 Fever, unspecified: Secondary | ICD-10-CM | POA: Diagnosis not present

## 2021-12-21 DIAGNOSIS — M9905 Segmental and somatic dysfunction of pelvic region: Secondary | ICD-10-CM | POA: Diagnosis not present

## 2021-12-21 DIAGNOSIS — Q72891 Other reduction defects of right lower limb: Secondary | ICD-10-CM | POA: Diagnosis not present

## 2021-12-21 DIAGNOSIS — M4316 Spondylolisthesis, lumbar region: Secondary | ICD-10-CM | POA: Diagnosis not present

## 2021-12-21 DIAGNOSIS — M4317 Spondylolisthesis, lumbosacral region: Secondary | ICD-10-CM | POA: Diagnosis not present

## 2021-12-21 DIAGNOSIS — M9903 Segmental and somatic dysfunction of lumbar region: Secondary | ICD-10-CM | POA: Diagnosis not present

## 2021-12-21 DIAGNOSIS — M5137 Other intervertebral disc degeneration, lumbosacral region: Secondary | ICD-10-CM | POA: Diagnosis not present

## 2021-12-21 DIAGNOSIS — M5136 Other intervertebral disc degeneration, lumbar region: Secondary | ICD-10-CM | POA: Diagnosis not present

## 2021-12-21 DIAGNOSIS — M9904 Segmental and somatic dysfunction of sacral region: Secondary | ICD-10-CM | POA: Diagnosis not present

## 2021-12-30 DIAGNOSIS — N401 Enlarged prostate with lower urinary tract symptoms: Secondary | ICD-10-CM | POA: Diagnosis not present

## 2021-12-30 DIAGNOSIS — N138 Other obstructive and reflux uropathy: Secondary | ICD-10-CM | POA: Diagnosis not present

## 2022-01-04 ENCOUNTER — Ambulatory Visit (INDEPENDENT_AMBULATORY_CARE_PROVIDER_SITE_OTHER): Payer: Medicare Other

## 2022-01-04 DIAGNOSIS — I495 Sick sinus syndrome: Secondary | ICD-10-CM | POA: Diagnosis not present

## 2022-01-04 LAB — CUP PACEART REMOTE DEVICE CHECK
Battery Remaining Longevity: 119 mo
Battery Voltage: 3.01 V
Brady Statistic AP VP Percent: 0 %
Brady Statistic AP VS Percent: 0.13 %
Brady Statistic AS VP Percent: 0.03 %
Brady Statistic AS VS Percent: 99.84 %
Brady Statistic RA Percent Paced: 0.14 %
Brady Statistic RV Percent Paced: 0.03 %
Date Time Interrogation Session: 20230725015507
Implantable Lead Implant Date: 20180126
Implantable Lead Implant Date: 20180126
Implantable Lead Location: 753859
Implantable Lead Location: 753860
Implantable Lead Model: 5076
Implantable Lead Model: 5076
Implantable Pulse Generator Implant Date: 20180126
Lead Channel Impedance Value: 304 Ohm
Lead Channel Impedance Value: 323 Ohm
Lead Channel Impedance Value: 399 Ohm
Lead Channel Impedance Value: 437 Ohm
Lead Channel Pacing Threshold Amplitude: 0.625 V
Lead Channel Pacing Threshold Amplitude: 0.75 V
Lead Channel Pacing Threshold Pulse Width: 0.4 ms
Lead Channel Pacing Threshold Pulse Width: 0.4 ms
Lead Channel Sensing Intrinsic Amplitude: 2.125 mV
Lead Channel Sensing Intrinsic Amplitude: 2.125 mV
Lead Channel Sensing Intrinsic Amplitude: 4.375 mV
Lead Channel Sensing Intrinsic Amplitude: 4.375 mV
Lead Channel Setting Pacing Amplitude: 2 V
Lead Channel Setting Pacing Amplitude: 2.5 V
Lead Channel Setting Pacing Pulse Width: 0.4 ms
Lead Channel Setting Sensing Sensitivity: 2 mV

## 2022-01-06 DIAGNOSIS — N138 Other obstructive and reflux uropathy: Secondary | ICD-10-CM | POA: Diagnosis not present

## 2022-01-06 DIAGNOSIS — N486 Induration penis plastica: Secondary | ICD-10-CM | POA: Diagnosis not present

## 2022-01-06 DIAGNOSIS — E291 Testicular hypofunction: Secondary | ICD-10-CM | POA: Diagnosis not present

## 2022-01-06 DIAGNOSIS — N529 Male erectile dysfunction, unspecified: Secondary | ICD-10-CM | POA: Diagnosis not present

## 2022-01-06 DIAGNOSIS — Z87898 Personal history of other specified conditions: Secondary | ICD-10-CM | POA: Diagnosis not present

## 2022-01-06 DIAGNOSIS — N401 Enlarged prostate with lower urinary tract symptoms: Secondary | ICD-10-CM | POA: Diagnosis not present

## 2022-01-18 DIAGNOSIS — H532 Diplopia: Secondary | ICD-10-CM | POA: Diagnosis not present

## 2022-01-18 DIAGNOSIS — H5203 Hypermetropia, bilateral: Secondary | ICD-10-CM | POA: Diagnosis not present

## 2022-01-18 DIAGNOSIS — H524 Presbyopia: Secondary | ICD-10-CM | POA: Diagnosis not present

## 2022-01-18 DIAGNOSIS — H52223 Regular astigmatism, bilateral: Secondary | ICD-10-CM | POA: Diagnosis not present

## 2022-01-18 DIAGNOSIS — H53143 Visual discomfort, bilateral: Secondary | ICD-10-CM | POA: Diagnosis not present

## 2022-02-01 NOTE — Progress Notes (Signed)
Remote pacemaker transmission.   

## 2022-02-26 DIAGNOSIS — U071 COVID-19: Secondary | ICD-10-CM | POA: Diagnosis not present

## 2022-02-26 DIAGNOSIS — R058 Other specified cough: Secondary | ICD-10-CM | POA: Diagnosis not present

## 2022-02-26 DIAGNOSIS — R509 Fever, unspecified: Secondary | ICD-10-CM | POA: Diagnosis not present

## 2022-03-18 DIAGNOSIS — Z85828 Personal history of other malignant neoplasm of skin: Secondary | ICD-10-CM | POA: Diagnosis not present

## 2022-03-18 DIAGNOSIS — E882 Lipomatosis, not elsewhere classified: Secondary | ICD-10-CM | POA: Diagnosis not present

## 2022-03-18 DIAGNOSIS — E663 Overweight: Secondary | ICD-10-CM | POA: Diagnosis not present

## 2022-03-18 DIAGNOSIS — L578 Other skin changes due to chronic exposure to nonionizing radiation: Secondary | ICD-10-CM | POA: Diagnosis not present

## 2022-03-18 DIAGNOSIS — D2372 Other benign neoplasm of skin of left lower limb, including hip: Secondary | ICD-10-CM | POA: Diagnosis not present

## 2022-03-18 DIAGNOSIS — L821 Other seborrheic keratosis: Secondary | ICD-10-CM | POA: Diagnosis not present

## 2022-03-18 DIAGNOSIS — B078 Other viral warts: Secondary | ICD-10-CM | POA: Diagnosis not present

## 2022-03-18 DIAGNOSIS — D1801 Hemangioma of skin and subcutaneous tissue: Secondary | ICD-10-CM | POA: Diagnosis not present

## 2022-04-05 ENCOUNTER — Ambulatory Visit (INDEPENDENT_AMBULATORY_CARE_PROVIDER_SITE_OTHER): Payer: Medicare Other

## 2022-04-05 ENCOUNTER — Encounter: Payer: Medicare Other | Admitting: Student

## 2022-04-05 DIAGNOSIS — I495 Sick sinus syndrome: Secondary | ICD-10-CM

## 2022-04-05 LAB — CUP PACEART REMOTE DEVICE CHECK
Battery Remaining Longevity: 116 mo
Battery Voltage: 3.01 V
Brady Statistic AP VP Percent: 0.02 %
Brady Statistic AP VS Percent: 0.78 %
Brady Statistic AS VP Percent: 0.04 %
Brady Statistic AS VS Percent: 99.17 %
Brady Statistic RA Percent Paced: 0.79 %
Brady Statistic RV Percent Paced: 0.06 %
Date Time Interrogation Session: 20231024015458
Implantable Lead Connection Status: 753985
Implantable Lead Connection Status: 753985
Implantable Lead Implant Date: 20180126
Implantable Lead Implant Date: 20180126
Implantable Lead Location: 753859
Implantable Lead Location: 753860
Implantable Lead Model: 5076
Implantable Lead Model: 5076
Implantable Pulse Generator Implant Date: 20180126
Lead Channel Impedance Value: 285 Ohm
Lead Channel Impedance Value: 323 Ohm
Lead Channel Impedance Value: 380 Ohm
Lead Channel Impedance Value: 399 Ohm
Lead Channel Pacing Threshold Amplitude: 0.75 V
Lead Channel Pacing Threshold Amplitude: 0.75 V
Lead Channel Pacing Threshold Pulse Width: 0.4 ms
Lead Channel Pacing Threshold Pulse Width: 0.4 ms
Lead Channel Sensing Intrinsic Amplitude: 2.125 mV
Lead Channel Sensing Intrinsic Amplitude: 2.125 mV
Lead Channel Sensing Intrinsic Amplitude: 4 mV
Lead Channel Sensing Intrinsic Amplitude: 4 mV
Lead Channel Setting Pacing Amplitude: 2 V
Lead Channel Setting Pacing Amplitude: 2.5 V
Lead Channel Setting Pacing Pulse Width: 0.4 ms
Lead Channel Setting Sensing Sensitivity: 2 mV
Zone Setting Status: 755011
Zone Setting Status: 755011

## 2022-04-13 NOTE — Progress Notes (Signed)
Electrophysiology Office Note Date: 04/18/2022  ID:  George [name], George [name] [birth year], MRN [id number]  PCP: [name], MD Primary Cardiologist: None Electrophysiologist: Dr. Rayann Heman -> Melida Quitter, MD  -  CC: Pacemaker follow-up  George [name] is a 81 y.o. male seen today for Melida Quitter, MD for routine electrophysiology followup. Since last being seen in our clinic the patient reports doing very well.  he denies chest pain, palpitations, dyspnea, PND, orthopnea, nausea, vomiting, dizziness, syncope, edema, weight gain, or early satiety.   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 s/p MDT dual chamber PPM    Small bowel obstruction ([place])    Past Surgical History:  Procedure Laterality Date   APPENDECTOMY  09/2003   BASAL CELL CARCINOMA EXCISION     nose   COLECTOMY Right 09/2003   Archie Endo 10/27/2010   COLON SURGERY     EP IMPLANTABLE DEVICE N/A 04/05/2016   Procedure: Loop Recorder Insertion;  Surgeon: Thompson Grayer, MD;  Location: [facility];  Service: Cardiovascular;  Laterality: N/A;   EP IMPLANTABLE DEVICE N/A 07/08/2016   MDT Azure XT DR implanted by Dr Curt Bears for sick sinus syndrome/ sinus pauses   EP IMPLANTABLE DEVICE N/A 07/08/2016   Procedure: Loop Recorder Removal;  Surgeon: Will Meredith Leeds, MD;  Location: [facility];  Service: Cardiovascular;  Laterality: N/A;   TONSILLECTOMY     TOTAL KNEE ARTHROPLASTY Right 03/03/2014   Procedure: RIGHT TOTAL KNEE ARTHROPLASTY;  Surgeon: Gearlean Alf, MD;  [facility];  Service: Orthopedics;  Laterality: Right;   TOTAL KNEE ARTHROPLASTY Left 01/2005   Archie Endo 10/27/2010    Current Outpatient Medications   Medication Sig Dispense Refill   ANDROGEL PUMP 20.25 MG/ACT (1.62%) GEL Apply 1 application topically daily.      buPROPion (WELLBUTRIN XL) 150 MG 24 hr tablet Take 450 mg by mouth every morning.     buPROPion (WELLBUTRIN XL) 150 MG 24 hr tablet Take by mouth.     celecoxib (CELEBREX) 200 MG capsule Take 200 mg by mouth every other day.      citalopram (CELEXA) 20 MG tablet Take 1 tablet by mouth daily.     finasteride (PROSCAR) 5 MG tablet Take 5 mg by mouth every evening.      Multiple Vitamin (MULTIVITAMIN WITH MINERALS) TABS tablet Take 1 tablet by mouth daily.     pantoprazole (PROTONIX) 40 MG tablet Take 40 mg by mouth 2 (two) times daily.     sodium chloride (OCEAN) 0.65 % SOLN nasal spray Place 1 spray into both nostrils as needed for congestion. 15 mL 0   tadalafil (CIALIS) 5 MG tablet Take by mouth as needed.     tamsulosin (FLOMAX) 0.4 MG CAPS capsule Take 0.4 mg by mouth daily.     traMADol (ULTRAM) 50 MG tablet Take 1 tablet (50 mg total) by mouth every 6 (six) hours as needed. 15 tablet 0   propranolol (INDERAL) 20 MG tablet Take 20 mg by mouth daily. (Patient not taking: Reported on 04/18/2022)     No current facility-administered medications for this visit.    Allergies:  Patient has no known allergies.   Social History: Social History   Socioeconomic History   Marital status: Married    Spouse name: [name]   Number of children: Not on file   Years of education: Not on file   Highest education level: Not on file  Occupational History   Not on file  Tobacco Use   Smoking status: Former    Packs/day: 2.00    Years: 20.00    Total pack years: 40.00    Types: Cigarettes    Quit date: 1984    Years since quitting: 39.8   Smokeless tobacco: Never  Vaping Use   Vaping Use: Never used  Substance and Sexual Activity   Alcohol use: Yes    Alcohol/week: 6.0 standard drinks of alcohol    Types: 6 Cans of beer per week   Drug use: No   Sexual activity: Not on file   Other Topics Concern   Not on file  Social History Narrative   Lives with wife    Right Handed   Drinks 2-3 cups caffeine daily   Social Determinants of Health   Financial Resource Strain: Not on file  Food Insecurity: Not on file  Transportation Needs: Not on file  Physical Activity: Not on file  Stress: Not on file  Social Connections: Not on file  Intimate Partner Violence: Not on file    Family History: Family History  Problem Relation Age of Onset   Cancer Father    Diabetes Mellitus II Mother      Review of Systems: All other systems reviewed and are otherwise negative except as noted above.  Physical Exam: Vitals:   04/18/22 1204 04/18/22 1221  BP: (!) 148/76 122/68  Pulse: 69   SpO2: 97%   Weight: 173 lb 4 oz (78.6 kg)   Height: '5\' 8"'$  (1.727 m)      GEN- The patient is well appearing, alert and oriented x 3 today.   HEENT: normocephalic, atraumatic; sclera clear, conjunctiva pink; hearing intact; oropharynx clear; neck supple, no JVP Lymph- no cervical lymphadenopathy Lungs- Clear to ausculation bilaterally, normal work of breathing.  No wheezes, rales, rhonchi Heart- Regular  rate and rhythm, no murmurs, rubs or gallops, PMI not laterally displaced GI- soft, non-tender, non-distended, bowel sounds present, no hepatosplenomegaly Extremities- no clubbing or cyanosis. No peripheral edema; DP/PT/radial pulses 2+ bilaterally MS- no significant deformity or atrophy Skin- warm and dry, no rash or lesion; PPM pocket well healed Psych- euthymic mood, full affect Neuro- strength and sensation are intact  PPM Interrogation-  reviewed in detail today,  See PACEART report.  EKG:  EKG is ordered today. Personal review of ekg ordered today shows NSR at 69 bpm   Recent Labs: No results found for requested labs within last 365 days.   Wt Readings from Last 3 Encounters:  04/18/22 173 lb 4 oz (78.6 kg)  10/18/21 170 lb (77.1 kg)  12/17/20 177 lb (80.3 kg)      Other studies Reviewed: Additional studies/ records that were reviewed today include: Previous EP office notes, Previous remote checks, Most recent labwork.   Assessment and Plan:  1. Sick sinus syndrome s/p Medtronic PPM  Normal PPM function. Paces < 1% See Pace Art report No changes today  2. HTN Slightly elevated on arrival, but normal after several minutes of rest. He will check at home and follow up with PCP if systolic is > 353-299 with any regularity.   Current medicines are reviewed at  length with the patient today.    Disposition:   Follow up with Dr. Myles Gip in 12 months    Signed, Shirley Friar, PA-C  04/18/2022 12:07 PM  [place] [address] [place] [address] [phone] (office) [phone] (fax)

## 2022-04-18 ENCOUNTER — Encounter: Payer: Self-pay | Admitting: Student

## 2022-04-18 ENCOUNTER — Ambulatory Visit: Payer: Medicare Other | Attending: Student | Admitting: Student

## 2022-04-18 VITALS — BP 122/68 | HR 69 | Ht 68.0 in | Wt 173.2 lb

## 2022-04-18 DIAGNOSIS — I495 Sick sinus syndrome: Secondary | ICD-10-CM

## 2022-04-18 LAB — CUP PACEART INCLINIC DEVICE CHECK
Battery Remaining Longevity: 116 mo
Battery Voltage: 3.01 V
Brady Statistic AP VP Percent: 0.02 %
Brady Statistic AP VS Percent: 0.64 %
Brady Statistic AS VP Percent: 0.03 %
Brady Statistic AS VS Percent: 99.31 %
Brady Statistic RA Percent Paced: 0.76 %
Brady Statistic RV Percent Paced: 0.05 %
Date Time Interrogation Session: 20231106122245
Implantable Lead Connection Status: 753985
Implantable Lead Connection Status: 753985
Implantable Lead Implant Date: 20180126
Implantable Lead Implant Date: 20180126
Implantable Lead Location: 753859
Implantable Lead Location: 753860
Implantable Lead Model: 5076
Implantable Lead Model: 5076
Implantable Pulse Generator Implant Date: 20180126
Lead Channel Impedance Value: 304 Ohm
Lead Channel Impedance Value: 323 Ohm
Lead Channel Impedance Value: 399 Ohm
Lead Channel Impedance Value: 437 Ohm
Lead Channel Pacing Threshold Amplitude: 0.75 V
Lead Channel Pacing Threshold Amplitude: 0.75 V
Lead Channel Pacing Threshold Pulse Width: 0.4 ms
Lead Channel Pacing Threshold Pulse Width: 0.4 ms
Lead Channel Sensing Intrinsic Amplitude: 2.125 mV
Lead Channel Sensing Intrinsic Amplitude: 2.125 mV
Lead Channel Sensing Intrinsic Amplitude: 3.625 mV
Lead Channel Sensing Intrinsic Amplitude: 3.75 mV
Lead Channel Setting Pacing Amplitude: 2 V
Lead Channel Setting Pacing Amplitude: 2.5 V
Lead Channel Setting Pacing Pulse Width: 0.4 ms
Lead Channel Setting Sensing Sensitivity: 2 mV
Zone Setting Status: 755011
Zone Setting Status: 755011

## 2022-04-18 NOTE — Patient Instructions (Signed)
Medication Instructions:  Your physician recommends that you continue on your current medications as directed. Please refer to the Current Medication list given to you today.  *If you need a refill on your cardiac medications before your next appointment, please call your pharmacy*   Lab Work: None If you have labs (blood work) drawn today and your tests are completely normal, you will receive your results only by: [place] (if you have MyChart) OR A paper copy in the mail If you have any lab test that is abnormal or we need to change your treatment, we will call you to review the results.   Follow-Up: At [facility], you and your health needs are our priority.  As part of our continuing mission to provide you with exceptional heart care, we have created designated Provider Care Teams.  These Care Teams include your primary Cardiologist (physician) and Advanced Practice Providers (APPs -  Physician Assistants and Nurse Practitioners) who all work together to provide you with the care you need, when you need it.  Your next appointment:   1 year(s)  The format for your next appointment:   In Person  Provider:   Doralee Albino, MD    Important Information About Sugar

## 2022-04-25 NOTE — Progress Notes (Signed)
Remote pacemaker transmission.   

## 2022-05-24 DIAGNOSIS — H538 Other visual disturbances: Secondary | ICD-10-CM | POA: Diagnosis not present

## 2022-05-24 DIAGNOSIS — H518 Other specified disorders of binocular movement: Secondary | ICD-10-CM | POA: Diagnosis not present

## 2022-05-24 DIAGNOSIS — H532 Diplopia: Secondary | ICD-10-CM | POA: Diagnosis not present

## 2022-05-27 DIAGNOSIS — J069 Acute upper respiratory infection, unspecified: Secondary | ICD-10-CM | POA: Diagnosis not present

## 2022-05-27 DIAGNOSIS — E7800 Pure hypercholesterolemia, unspecified: Secondary | ICD-10-CM | POA: Diagnosis not present

## 2022-05-27 DIAGNOSIS — M170 Bilateral primary osteoarthritis of knee: Secondary | ICD-10-CM | POA: Diagnosis not present

## 2022-05-27 DIAGNOSIS — N401 Enlarged prostate with lower urinary tract symptoms: Secondary | ICD-10-CM | POA: Diagnosis not present

## 2022-05-27 DIAGNOSIS — Z0000 Encounter for general adult medical examination without abnormal findings: Secondary | ICD-10-CM | POA: Diagnosis not present

## 2022-05-27 DIAGNOSIS — F3341 Major depressive disorder, recurrent, in partial remission: Secondary | ICD-10-CM | POA: Diagnosis not present

## 2022-05-27 DIAGNOSIS — F09 Unspecified mental disorder due to known physiological condition: Secondary | ICD-10-CM | POA: Diagnosis not present

## 2022-05-27 DIAGNOSIS — K219 Gastro-esophageal reflux disease without esophagitis: Secondary | ICD-10-CM | POA: Diagnosis not present

## 2022-05-27 DIAGNOSIS — E291 Testicular hypofunction: Secondary | ICD-10-CM | POA: Diagnosis not present

## 2022-05-31 DIAGNOSIS — J018 Other acute sinusitis: Secondary | ICD-10-CM | POA: Diagnosis not present

## 2022-06-09 DIAGNOSIS — H5000 Unspecified esotropia: Secondary | ICD-10-CM | POA: Diagnosis not present

## 2022-06-09 DIAGNOSIS — H532 Diplopia: Secondary | ICD-10-CM | POA: Diagnosis not present

## 2022-06-09 DIAGNOSIS — H509 Unspecified strabismus: Secondary | ICD-10-CM | POA: Diagnosis not present

## 2022-06-09 DIAGNOSIS — Z01818 Encounter for other preprocedural examination: Secondary | ICD-10-CM | POA: Diagnosis not present

## 2022-06-09 DIAGNOSIS — H4921 Sixth [abducent] nerve palsy, right eye: Secondary | ICD-10-CM | POA: Diagnosis not present

## 2022-06-10 ENCOUNTER — Ambulatory Visit
Admission: RE | Admit: 2022-06-10 | Discharge: 2022-06-10 | Disposition: A | Discharge status: Home / Self Care | Payer: Medicare Other | Source: Ambulatory Visit | Attending: Family Medicine | Admitting: Family Medicine

## 2022-06-10 ENCOUNTER — Other Ambulatory Visit: Payer: Self-pay | Admitting: Family Medicine

## 2022-06-10 DIAGNOSIS — H509 Unspecified strabismus: Secondary | ICD-10-CM

## 2022-06-10 DIAGNOSIS — Z95810 Presence of automatic (implantable) cardiac defibrillator: Secondary | ICD-10-CM | POA: Diagnosis not present

## 2022-06-10 DIAGNOSIS — Z01818 Encounter for other preprocedural examination: Secondary | ICD-10-CM | POA: Diagnosis not present

## 2022-06-10 DIAGNOSIS — M47814 Spondylosis without myelopathy or radiculopathy, thoracic region: Secondary | ICD-10-CM | POA: Diagnosis not present

## 2022-06-17 DIAGNOSIS — B351 Tinea unguium: Secondary | ICD-10-CM | POA: Diagnosis not present

## 2022-06-29 DIAGNOSIS — H532 Diplopia: Secondary | ICD-10-CM | POA: Diagnosis not present

## 2022-06-29 DIAGNOSIS — H5000 Unspecified esotropia: Secondary | ICD-10-CM | POA: Diagnosis not present

## 2022-06-29 DIAGNOSIS — H4921 Sixth [abducent] nerve palsy, right eye: Secondary | ICD-10-CM | POA: Diagnosis not present

## 2022-06-29 DIAGNOSIS — H50111 Monocular exotropia, right eye: Secondary | ICD-10-CM | POA: Diagnosis not present

## 2022-07-05 ENCOUNTER — Ambulatory Visit (INDEPENDENT_AMBULATORY_CARE_PROVIDER_SITE_OTHER): Payer: Medicare Other

## 2022-07-05 DIAGNOSIS — I495 Sick sinus syndrome: Secondary | ICD-10-CM | POA: Diagnosis not present

## 2022-07-05 LAB — CUP PACEART REMOTE DEVICE CHECK
Battery Remaining Longevity: 115 mo
Battery Voltage: 3.01 V
Brady Statistic AP VP Percent: 0.02 %
Brady Statistic AP VS Percent: 0.49 %
Brady Statistic AS VP Percent: 0.04 %
Brady Statistic AS VS Percent: 99.45 %
Brady Statistic RA Percent Paced: 0.47 %
Brady Statistic RV Percent Paced: 0.06 %
Date Time Interrogation Session: 20240123005615
Implantable Lead Connection Status: 753985
Implantable Lead Connection Status: 753985
Implantable Lead Implant Date: 20180126
Implantable Lead Implant Date: 20180126
Implantable Lead Location: 753859
Implantable Lead Location: 753860
Implantable Lead Model: 5076
Implantable Lead Model: 5076
Implantable Pulse Generator Implant Date: 20180126
Lead Channel Impedance Value: 304 Ohm
Lead Channel Impedance Value: 342 Ohm
Lead Channel Impedance Value: 418 Ohm
Lead Channel Impedance Value: 456 Ohm
Lead Channel Pacing Threshold Amplitude: 0.75 V
Lead Channel Pacing Threshold Amplitude: 0.75 V
Lead Channel Pacing Threshold Pulse Width: 0.4 ms
Lead Channel Pacing Threshold Pulse Width: 0.4 ms
Lead Channel Sensing Intrinsic Amplitude: 2.375 mV
Lead Channel Sensing Intrinsic Amplitude: 2.375 mV
Lead Channel Sensing Intrinsic Amplitude: 4.75 mV
Lead Channel Sensing Intrinsic Amplitude: 4.75 mV
Lead Channel Setting Pacing Amplitude: 2 V
Lead Channel Setting Pacing Amplitude: 2.5 V
Lead Channel Setting Pacing Pulse Width: 0.4 ms
Lead Channel Setting Sensing Sensitivity: 2 mV
Zone Setting Status: 755011
Zone Setting Status: 755011

## 2022-07-08 NOTE — Progress Notes (Signed)
Remote pacemaker transmission.   

## 2022-09-02 DIAGNOSIS — N529 Male erectile dysfunction, unspecified: Secondary | ICD-10-CM | POA: Diagnosis not present

## 2022-09-02 DIAGNOSIS — N401 Enlarged prostate with lower urinary tract symptoms: Secondary | ICD-10-CM | POA: Diagnosis not present

## 2022-09-09 DIAGNOSIS — N529 Male erectile dysfunction, unspecified: Secondary | ICD-10-CM | POA: Diagnosis not present

## 2022-09-09 DIAGNOSIS — N138 Other obstructive and reflux uropathy: Secondary | ICD-10-CM | POA: Diagnosis not present

## 2022-09-09 DIAGNOSIS — E291 Testicular hypofunction: Secondary | ICD-10-CM | POA: Diagnosis not present

## 2022-09-09 DIAGNOSIS — Z87898 Personal history of other specified conditions: Secondary | ICD-10-CM | POA: Diagnosis not present

## 2022-09-09 DIAGNOSIS — N401 Enlarged prostate with lower urinary tract symptoms: Secondary | ICD-10-CM | POA: Diagnosis not present

## 2022-09-09 DIAGNOSIS — N486 Induration penis plastica: Secondary | ICD-10-CM | POA: Diagnosis not present

## 2022-09-12 ENCOUNTER — Ambulatory Visit (INDEPENDENT_AMBULATORY_CARE_PROVIDER_SITE_OTHER): Payer: Medicare Other | Admitting: Neurology

## 2022-09-12 ENCOUNTER — Encounter: Payer: Self-pay | Admitting: Neurology

## 2022-09-12 VITALS — BP 149/84 | HR 69 | Ht 68.0 in | Wt 180.8 lb

## 2022-09-12 DIAGNOSIS — R413 Other amnesia: Secondary | ICD-10-CM | POA: Diagnosis not present

## 2022-09-12 DIAGNOSIS — G3184 Mild cognitive impairment, so stated: Secondary | ICD-10-CM

## 2022-09-12 MED ORDER — MEMANTINE HCL 10 MG PO TABS: 10.0000 mg | ORAL_TABLET | Freq: Two times a day (BID) | ORAL | 5 refills | Status: DC

## 2022-09-12 NOTE — Patient Instructions (Signed)
I had a long discussion with the patient and his wife regarding his remote episode of acute confusional state being of unclear etiology possibly encephalopathy related to excessive drinking of cough syrup. 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  Recommending increase the dose of Namenda to 10 mg twice daily.. I recommend we repeat EEG to look for seizure activity and dementia pending labs. I encouraged him to increase participation in cognitively challenging activities like solving crossword puzzles, playing bridge and sodoku.  We also discussed memory compensation strategies.  Will return for follow-up in the future in 4 months or call earlier if necessary.  Memory Compensation Strategies  Use "WARM" strategy.  W= write it down  A= associate it  R= repeat it  M= make a mental note  2.   You can keep a Social worker.  Use a 3-ring notebook with sections for the following: calendar, important names and phone numbers,  medications, doctors' names/phone numbers, lists/reminders, and a section to journal what you did  each day.   3.    Use a calendar to write appointments down.  4.    Write yourself a schedule for the day.  This can be placed on the calendar or in a separate section of the Memory Notebook.  Keeping a  regular schedule can help memory.  5.    Use medication organizer with sections for each day or morning/evening pills.  You may need help loading it  6.    Keep a basket, or pegboard by the door.  Place items that you need to take out with you in the basket or on the pegboard.  You may also want to  include a message board for reminders.  7.    Use sticky notes.  Place sticky notes with reminders in a place where the task is performed.  For example: " turn off the  stove" placed by the stove, "lock the door" placed on the door at eye level, " take your medications" on  the bathroom mirror or by the place where you normally take your  medications.  8.    Use alarms/timers.  Use while cooking to remind yourself to check on food or as a reminder to take your medicine, or as a  reminder to make a call, or as a reminder to perform another task, etc.

## 2022-09-12 NOTE — Progress Notes (Signed)
[facility] [address] [place]. [place] [address] [phone]       OFFICE FOLLOW UP VISIT NOTE  [name] Date of Birth:  Jul 25, 1940 Medical Record Number:  [id number]   Referring MD: George [name]  Reason for Referral: Encephalopathy  [id number] visit  04/16/2020 :George [name] is a 82 year old Caucasian male seen today for initial office consultation visit.  Is accompanied by his wife.  History is obtained from them and review of hospital referral notes and I personally reviewed available pertinent imaging films in PACS.  He has past medical history of sick sinus syndrome status post pacemaker and treated basal cell skin cancer and arthritis.  He was admitted to [facility] on 03/29/2020 to 04/04/2020.  He was found by his wife to be sitting and not responding on the floor with 2 empty bottles of cough syrup next to him.  He was found in the hospital to be quite confused and disoriented.  Initial impression was that he had Wernicke's encephalopathy and was a heavy drinker but the patient and wife both deny this.  Admission labs were pretty much unremarkable.  Subsequently underwent a CT scan of the head which showed no acute abnormality and MRI scan also showed no acute stroke no significant abnormality though it is significantly motion degraded and suboptimal quality.  He also had an EEG which was apparently normal but I do not have the actual report.  The patient gradually improved but to date does not have full memory of the event.  He remembers bits and pieces of the hospitalization but does not remember EMS taking him to the hospital.  Denied any headache at that time double vision, focal extremity weakness, numbness vertigo or diplopia.  He has no known prior history of strokes, TIAs, seizures, significant head injury with loss of consciousness or any other neurological problems.  He states is done well but still has some short-term memory  difficulties particularly related to the episode. Update 09/12/2022 : Patient is referred back by Dr. Shirline [name] for evaluation for memory difficulties which are worsening.  He is accompanied by his wife today.  History is obtained from them and I personally reviewed pertinent available imaging films in PACS and electronic medical records and referral notes..  Patient had this episode of encephalopathy in October 2021 and since then has had some mild memory and cognitive difficulties with her persisted.  Patient's wife seems to think that there may be slowly getting worse though the patient agrees.  He continues to have mild short-term memory difficulties and occasional trouble finding words so he can remember them later.  He started using the GPS while driving all the time and he gets lost.  He also not able to remember his medications and his wife has.  Reminded him daily.  He is otherwise independent in activities of daily living.  He denies any prior history of dementia in his family.  Patient admits to drinking 3 beers a day and knows he needs to cut back.  He has been taking Namenda 5 mg twice daily several years has never tried going up to the full dose of 10 mg twice daily.  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  He had surgery in the right eye which did not help but plans to have a second surgery on the same eyes.  On Mini-Mental status testing today scored 28/30 with 1 deficits in.  This is  declined from 30/30 at last visit 2 years ago.  Patient has not been bearing in regular activities that are cognitively challenging has not been using any memory compensation strategies.    ROS:   14 system review of systems is positive for confusion, hallucinations, agitation, disorientation, decreased memory, hiccups, gastritis and all other systems negative PMH:  Past Medical History:  Diagnosis Date   Arthritis    "qwhere" (07/08/2016)   Cancer    basal cell  skin cancer removed from nose  and pre cancerous hemicolectomy years ago    Depression    Sick sinus syndrome    a. s/p MDT dual chamber PPM    Small bowel obstruction     Social History:  Social History   Socioeconomic History   Marital status: Married    Spouse name: George [name]   Number of children: Not on file   Years of education: Not on file   Highest education level: Not on file  Occupational History   Not on file  Tobacco Use   Smoking status: Former    Packs/day: 2.00    Years: 20.00    Additional pack years: 0.00    Total pack years: 40.00    Types: Cigarettes    Quit date: 1984    Years since quitting: 40.2   Smokeless tobacco: Never  Vaping Use   Vaping Use: Never used  Substance and Sexual Activity   Alcohol use: Yes    Alcohol/week: 6.0 standard drinks of alcohol    Types: 6 Cans of beer per week   Drug use: No   Sexual activity: Not on file  Other Topics Concern   Not on file  Social History Narrative   Lives with wife    Right Handed   Drinks 2-3 cups caffeine daily   Social Determinants of Health   Financial Resource Strain: Not on file  Food Insecurity: Not on file  Transportation Needs: Not on file  Physical Activity: Not on file  Stress: Not on file  Social Connections: Not on file  Intimate Partner Violence: Not on file    Medications:   Current Outpatient Medications on File Prior to Visit  Medication Sig Dispense Refill   ANDROGEL PUMP 20.25 MG/ACT (1.62%) GEL Apply 1 application topically daily.      buPROPion (WELLBUTRIN XL) 150 MG 24 hr tablet Take by mouth.     celecoxib (CELEBREX) 200 MG capsule Take 200 mg by mouth every other day.      citalopram (CELEXA) 20 MG tablet Take 1 tablet by mouth daily.     finasteride (PROSCAR) 5 MG tablet Take 5 mg by mouth every evening.      memantine (NAMENDA) 5 MG tablet Take 5 mg by mouth daily.     Multiple Vitamin (MULTIVITAMIN WITH MINERALS) TABS tablet Take 1 tablet by mouth daily.      Multiple Vitamin (MULTIVITAMIN) tablet Take 1 tablet by mouth daily.     pantoprazole (PROTONIX) 40 MG tablet Take 40 mg by mouth 2 (two) times daily.     promethazine-dextromethorphan (PROMETHAZINE-DM) 6.25-15 MG/5ML syrup Take 5 mLs by mouth 4 (four) times daily as needed for cough.     tadalafil (CIALIS) 5 MG tablet Take by mouth as needed.     tamsulosin (FLOMAX) 0.4 MG CAPS capsule Take 0.4 mg by mouth daily.     traMADol (ULTRAM) 50 MG tablet Take 1 tablet (50 mg total) by mouth every 6 (six) hours as needed. 15 tablet  0   UNABLE TO FIND 10 mg. Med Name: alertec     No current facility-administered medications on file prior to visit.    Allergies:   Allergies  Allergen Reactions   Codeine Other (See Comments)    incoherance   Doxycycline Other (See Comments)    Stomach upset    Physical Exam General: well developed, well nourished elderly Caucasian male, seated, in no evident distress Head: head normocephalic and atraumatic.   Neck: supple with no carotid or supraclavicular bruits Cardiovascular: regular rate and rhythm, no murmurs Musculoskeletal: no deformity Skin:  no rash/petichiae Vascular:  Normal pulses all extremities  Neurologic Exam Mental Status: Awake and fully alert. Oriented to place and time. Recent and remote memory intact. Attention span, concentration and fund of knowledge appropriate. Mood and affect appropriate.  Diminished recall 1/3.  Able to name 15 animals which can walk on 4 legs.  Clock drawing 3/4.  On Mini-Mental status exam he scored 28/30 ( last visit 30/30 )  Cranial Nerves: Fundoscopic exam reveals sharp disc margins. Pupils equal, briskly reactive to light. Extraocular movements full without nystagmus. Visual fields full to confrontation. Hearing intact. Facial sensation intact. Face, tongue, palate moves normally and symmetrically.  Motor: Normal bulk and tone. Normal strength in all tested extremity muscles. Sensory.: intact to touch ,  pinprick , position and vibratory sensation.  Coordination: Rapid alternating movements normal in all extremities. Finger-to-nose and heel-to-shin performed accurately bilaterally. Gait and Station: Arises from chair without difficulty. Stance is normal. Gait demonstrates normal stride length and balance . Able to heel, toe and tandem walk without difficulty.  Reflexes: 1+ and symmetric. Toes downgoing.        09/12/2022    9:03 AM 04/16/2020    8:59 AM  MMSE - Mini Mental State Exam  Orientation to time 5 5  Orientation to Place 5 5  Registration 3 3  Attention/ Calculation 5 5  Recall 2 3  Language- name 2 objects 2 2  Language- repeat 1 1  Language- follow 3 step command 3 3  Language- read & follow direction 1 1  Write a sentence 1 1  Copy design 0 1  Total score 28 30      ASSESSMENT: 82 year old Caucasian male with transient episode of acute confusional state and psychosis following drinking excess cough syrup of unclear etiology in October 2021 possibly toxic metabolic encephalopathy followed by mild persistent cognitive impairment.     PLAN: I had a long discussion with the patient and his wife regarding his remote episode of acute confusional state being of unclear etiology possibly encephalopathy related to excessive drinking of cough syrup. 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  Recommending increase the dose of Namenda to 10 mg twice daily.. I recommend we repeat EEG to look for seizure activity and dementia pending labs. I encouraged him to increase participation in cognitively challenging activities like solving crossword puzzles, playing bridge and sodoku.  We also discussed memory compensation strategies.  Will return for follow-up in the future in 4 months or call earlier if necessary. Greater than 50% time during this 45-minute prolonged  visit was spent on counseling and coordination of care about his episode of acute confusion  and answering questions. Antony Contras, MD Note: This document was prepared with digital dictation and possible smart phrase technology. Any transcriptional errors that result from this process are unintentional.

## 2022-09-13 ENCOUNTER — Telehealth: Payer: Self-pay | Admitting: Neurology

## 2022-09-13 NOTE — Telephone Encounter (Signed)
medicare/BCBS sup NPR sent to [facility] [phone] 

## 2022-09-26 ENCOUNTER — Ambulatory Visit (INDEPENDENT_AMBULATORY_CARE_PROVIDER_SITE_OTHER): Payer: Medicare Other | Admitting: Neurology

## 2022-09-26 DIAGNOSIS — R4182 Altered mental status, unspecified: Secondary | ICD-10-CM

## 2022-09-26 DIAGNOSIS — R413 Other amnesia: Secondary | ICD-10-CM

## 2022-09-26 NOTE — Progress Notes (Signed)
Kindly inform the patient that EEG or brainwave activity was normal.  No seizure activity noted.

## 2022-10-04 ENCOUNTER — Ambulatory Visit (INDEPENDENT_AMBULATORY_CARE_PROVIDER_SITE_OTHER): Payer: Medicare Other

## 2022-10-04 DIAGNOSIS — I495 Sick sinus syndrome: Secondary | ICD-10-CM

## 2022-10-05 LAB — CUP PACEART REMOTE DEVICE CHECK
Battery Remaining Longevity: 114 mo
Battery Voltage: 3.01 V
Brady Statistic AP VP Percent: 0.02 %
Brady Statistic AP VS Percent: 0.64 %
Brady Statistic AS VP Percent: 0.04 %
Brady Statistic AS VS Percent: 99.31 %
Brady Statistic RA Percent Paced: 0.74 %
Brady Statistic RV Percent Paced: 0.06 %
Date Time Interrogation Session: 20240423020852
Implantable Lead Connection Status: 753985
Implantable Lead Connection Status: 753985
Implantable Lead Implant Date: 20180126
Implantable Lead Implant Date: 20180126
Implantable Lead Location: 753859
Implantable Lead Location: 753860
Implantable Lead Model: 5076
Implantable Lead Model: 5076
Implantable Pulse Generator Implant Date: 20180126
Lead Channel Impedance Value: 304 Ohm
Lead Channel Impedance Value: 361 Ohm
Lead Channel Impedance Value: 418 Ohm
Lead Channel Impedance Value: 418 Ohm
Lead Channel Pacing Threshold Amplitude: 0.625 V
Lead Channel Pacing Threshold Amplitude: 0.75 V
Lead Channel Pacing Threshold Pulse Width: 0.4 ms
Lead Channel Pacing Threshold Pulse Width: 0.4 ms
Lead Channel Sensing Intrinsic Amplitude: 2.125 mV
Lead Channel Sensing Intrinsic Amplitude: 2.125 mV
Lead Channel Sensing Intrinsic Amplitude: 4.375 mV
Lead Channel Sensing Intrinsic Amplitude: 4.375 mV
Lead Channel Setting Pacing Amplitude: 2 V
Lead Channel Setting Pacing Amplitude: 2.5 V
Lead Channel Setting Pacing Pulse Width: 0.4 ms
Lead Channel Setting Sensing Sensitivity: 2 mV
Zone Setting Status: 755011
Zone Setting Status: 755011

## 2022-11-02 NOTE — Progress Notes (Signed)
Remote pacemaker transmission.   

## 2022-11-15 DIAGNOSIS — R109 Unspecified abdominal pain: Secondary | ICD-10-CM | POA: Diagnosis not present

## 2022-11-15 DIAGNOSIS — R197 Diarrhea, unspecified: Secondary | ICD-10-CM | POA: Diagnosis not present

## 2022-11-16 DIAGNOSIS — R1084 Generalized abdominal pain: Secondary | ICD-10-CM | POA: Diagnosis not present

## 2022-11-17 DIAGNOSIS — H532 Diplopia: Secondary | ICD-10-CM | POA: Diagnosis not present

## 2022-11-17 DIAGNOSIS — H4921 Sixth [abducent] nerve palsy, right eye: Secondary | ICD-10-CM | POA: Diagnosis not present

## 2022-11-22 DIAGNOSIS — M170 Bilateral primary osteoarthritis of knee: Secondary | ICD-10-CM | POA: Diagnosis not present

## 2022-11-22 DIAGNOSIS — K219 Gastro-esophageal reflux disease without esophagitis: Secondary | ICD-10-CM | POA: Diagnosis not present

## 2022-11-22 DIAGNOSIS — N401 Enlarged prostate with lower urinary tract symptoms: Secondary | ICD-10-CM | POA: Diagnosis not present

## 2022-11-22 DIAGNOSIS — E7800 Pure hypercholesterolemia, unspecified: Secondary | ICD-10-CM | POA: Diagnosis not present

## 2022-11-22 DIAGNOSIS — E291 Testicular hypofunction: Secondary | ICD-10-CM | POA: Diagnosis not present

## 2022-11-22 DIAGNOSIS — F3341 Major depressive disorder, recurrent, in partial remission: Secondary | ICD-10-CM | POA: Diagnosis not present

## 2022-11-22 DIAGNOSIS — F09 Unspecified mental disorder due to known physiological condition: Secondary | ICD-10-CM | POA: Diagnosis not present

## 2022-12-12 ENCOUNTER — Ambulatory Visit (HOSPITAL_COMMUNITY)
Admission: RE | Admit: 2022-12-12 | Discharge: 2022-12-12 | Disposition: A | Discharge status: Home / Self Care | Payer: Medicare Other | Source: Ambulatory Visit | Attending: Neurology | Admitting: Neurology

## 2022-12-12 DIAGNOSIS — I6782 Cerebral ischemia: Secondary | ICD-10-CM | POA: Diagnosis not present

## 2022-12-12 DIAGNOSIS — R413 Other amnesia: Secondary | ICD-10-CM | POA: Diagnosis not present

## 2022-12-12 DIAGNOSIS — R9089 Other abnormal findings on diagnostic imaging of central nervous system: Secondary | ICD-10-CM | POA: Diagnosis not present

## 2022-12-12 MED ORDER — GADOBUTROL 1 MMOL/ML IV SOLN
8.0000 mL | Freq: Once | INTRAVENOUS | Status: AC | PRN
  Administered 2022-12-12: 8 mL via INTRAVENOUS

## 2022-12-30 NOTE — Progress Notes (Signed)
Kindly inform the patient that MRI scan of the brain shows mild age-related shrinkage of the brain.  No new or worrisome abnormality.

## 2023-01-02 ENCOUNTER — Telehealth: Payer: Self-pay

## 2023-01-02 NOTE — Telephone Encounter (Signed)
Called patient George [name] informed him that per Dr Pearlean Brownie "Kindly inform the patient that MRI scan of the brain shows mild age-related shrinkage of the brain.  No new or worrisome abnormality. " Pt verbalized understanding. Pt had no questions at this time but was encouraged to call back if questions arise.

## 2023-01-02 NOTE — Telephone Encounter (Signed)
-----   Message from Delia Heady sent at 12/30/2022  4:51 PM EDT ----- Joneen Roach inform the patient that MRI scan of the brain shows mild age-related shrinkage of the brain.  No new or worrisome abnormality.

## 2023-01-03 ENCOUNTER — Ambulatory Visit (INDEPENDENT_AMBULATORY_CARE_PROVIDER_SITE_OTHER): Payer: Medicare Other

## 2023-01-03 DIAGNOSIS — I495 Sick sinus syndrome: Secondary | ICD-10-CM

## 2023-01-05 LAB — CUP PACEART REMOTE DEVICE CHECK
Battery Remaining Longevity: 112 mo
Battery Voltage: 3.01 V
Brady Statistic AP VP Percent: 0.03 %
Brady Statistic AP VS Percent: 0.3 %
Brady Statistic AS VP Percent: 0.03 %
Brady Statistic AS VS Percent: 99.64 %
Brady Statistic RA Percent Paced: 0.31 %
Brady Statistic RV Percent Paced: 0.06 %
Date Time Interrogation Session: 20240723015847
Implantable Lead Connection Status: 753985
Implantable Lead Connection Status: 753985
Implantable Lead Implant Date: 20180126
Implantable Lead Implant Date: 20180126
Implantable Lead Location: 753859
Implantable Lead Location: 753860
Implantable Pulse Generator Implant Date: 20180126
Lead Channel Impedance Value: 304 Ohm
Lead Channel Impedance Value: 304 Ohm
Lead Channel Impedance Value: 380 Ohm
Lead Channel Impedance Value: 456 Ohm
Lead Channel Pacing Threshold Amplitude: 0.75 V
Lead Channel Pacing Threshold Pulse Width: 0.4 ms
Lead Channel Pacing Threshold Pulse Width: 0.4 ms
Lead Channel Sensing Intrinsic Amplitude: 2 mV
Lead Channel Sensing Intrinsic Amplitude: 2 mV
Lead Channel Sensing Intrinsic Amplitude: 3.625 mV
Lead Channel Setting Pacing Amplitude: 2 V
Lead Channel Setting Pacing Amplitude: 2.5 V
Lead Channel Setting Pacing Pulse Width: 0.4 ms
Zone Setting Status: 755011
Zone Setting Status: 755011

## 2023-01-19 NOTE — Progress Notes (Signed)
Remote pacemaker transmission.   

## 2023-01-23 DIAGNOSIS — M9903 Segmental and somatic dysfunction of lumbar region: Secondary | ICD-10-CM | POA: Diagnosis not present

## 2023-01-23 DIAGNOSIS — M9905 Segmental and somatic dysfunction of pelvic region: Secondary | ICD-10-CM | POA: Diagnosis not present

## 2023-01-23 DIAGNOSIS — Q72891 Other reduction defects of right lower limb: Secondary | ICD-10-CM | POA: Diagnosis not present

## 2023-01-23 DIAGNOSIS — M5137 Other intervertebral disc degeneration, lumbosacral region: Secondary | ICD-10-CM | POA: Diagnosis not present

## 2023-01-23 DIAGNOSIS — M5431 Sciatica, right side: Secondary | ICD-10-CM | POA: Diagnosis not present

## 2023-01-23 DIAGNOSIS — M4316 Spondylolisthesis, lumbar region: Secondary | ICD-10-CM | POA: Diagnosis not present

## 2023-01-23 DIAGNOSIS — M9904 Segmental and somatic dysfunction of sacral region: Secondary | ICD-10-CM | POA: Diagnosis not present

## 2023-01-23 DIAGNOSIS — M5136 Other intervertebral disc degeneration, lumbar region: Secondary | ICD-10-CM | POA: Diagnosis not present

## 2023-01-24 DIAGNOSIS — Q72891 Other reduction defects of right lower limb: Secondary | ICD-10-CM | POA: Diagnosis not present

## 2023-01-24 DIAGNOSIS — M9903 Segmental and somatic dysfunction of lumbar region: Secondary | ICD-10-CM | POA: Diagnosis not present

## 2023-01-24 DIAGNOSIS — M4316 Spondylolisthesis, lumbar region: Secondary | ICD-10-CM | POA: Diagnosis not present

## 2023-01-24 DIAGNOSIS — M9904 Segmental and somatic dysfunction of sacral region: Secondary | ICD-10-CM | POA: Diagnosis not present

## 2023-01-24 DIAGNOSIS — M5431 Sciatica, right side: Secondary | ICD-10-CM | POA: Diagnosis not present

## 2023-01-24 DIAGNOSIS — M5136 Other intervertebral disc degeneration, lumbar region: Secondary | ICD-10-CM | POA: Diagnosis not present

## 2023-01-24 DIAGNOSIS — M5137 Other intervertebral disc degeneration, lumbosacral region: Secondary | ICD-10-CM | POA: Diagnosis not present

## 2023-01-24 DIAGNOSIS — M9905 Segmental and somatic dysfunction of pelvic region: Secondary | ICD-10-CM | POA: Diagnosis not present

## 2023-01-26 ENCOUNTER — Telehealth: Payer: Self-pay | Admitting: Cardiovascular Disease

## 2023-01-26 DIAGNOSIS — M4316 Spondylolisthesis, lumbar region: Secondary | ICD-10-CM | POA: Diagnosis not present

## 2023-01-26 DIAGNOSIS — M9903 Segmental and somatic dysfunction of lumbar region: Secondary | ICD-10-CM | POA: Diagnosis not present

## 2023-01-26 DIAGNOSIS — M5136 Other intervertebral disc degeneration, lumbar region: Secondary | ICD-10-CM | POA: Diagnosis not present

## 2023-01-26 DIAGNOSIS — M9905 Segmental and somatic dysfunction of pelvic region: Secondary | ICD-10-CM | POA: Diagnosis not present

## 2023-01-26 DIAGNOSIS — Q72891 Other reduction defects of right lower limb: Secondary | ICD-10-CM | POA: Diagnosis not present

## 2023-01-26 DIAGNOSIS — M5431 Sciatica, right side: Secondary | ICD-10-CM | POA: Diagnosis not present

## 2023-01-26 DIAGNOSIS — M5137 Other intervertebral disc degeneration, lumbosacral region: Secondary | ICD-10-CM | POA: Diagnosis not present

## 2023-01-26 DIAGNOSIS — M9904 Segmental and somatic dysfunction of sacral region: Secondary | ICD-10-CM | POA: Diagnosis not present

## 2023-01-26 NOTE — Telephone Encounter (Signed)
Caller stated patient has a pacemaker and wants to know if it is OK for patient to have electrical muscle stimulation on his lower back.

## 2023-01-26 NOTE — Telephone Encounter (Signed)
Returned call to chiropractic office.  Advised ok to use Tens unit on low back d/t Pt with pacemaker and Pt paces 0.3% in the A and <0.1% in the V.  Advised should not cause problem for Pt to use Tens unit.  They will monitor Pt for symptoms during therapy.

## 2023-01-30 DIAGNOSIS — M5137 Other intervertebral disc degeneration, lumbosacral region: Secondary | ICD-10-CM | POA: Diagnosis not present

## 2023-01-30 DIAGNOSIS — M4316 Spondylolisthesis, lumbar region: Secondary | ICD-10-CM | POA: Diagnosis not present

## 2023-01-30 DIAGNOSIS — M9905 Segmental and somatic dysfunction of pelvic region: Secondary | ICD-10-CM | POA: Diagnosis not present

## 2023-01-30 DIAGNOSIS — M9904 Segmental and somatic dysfunction of sacral region: Secondary | ICD-10-CM | POA: Diagnosis not present

## 2023-01-30 DIAGNOSIS — M5431 Sciatica, right side: Secondary | ICD-10-CM | POA: Diagnosis not present

## 2023-01-30 DIAGNOSIS — M9903 Segmental and somatic dysfunction of lumbar region: Secondary | ICD-10-CM | POA: Diagnosis not present

## 2023-01-30 DIAGNOSIS — M5136 Other intervertebral disc degeneration, lumbar region: Secondary | ICD-10-CM | POA: Diagnosis not present

## 2023-01-30 DIAGNOSIS — Q72891 Other reduction defects of right lower limb: Secondary | ICD-10-CM | POA: Diagnosis not present

## 2023-01-31 DIAGNOSIS — M5431 Sciatica, right side: Secondary | ICD-10-CM | POA: Diagnosis not present

## 2023-01-31 DIAGNOSIS — M9903 Segmental and somatic dysfunction of lumbar region: Secondary | ICD-10-CM | POA: Diagnosis not present

## 2023-01-31 DIAGNOSIS — M9904 Segmental and somatic dysfunction of sacral region: Secondary | ICD-10-CM | POA: Diagnosis not present

## 2023-01-31 DIAGNOSIS — Q72891 Other reduction defects of right lower limb: Secondary | ICD-10-CM | POA: Diagnosis not present

## 2023-01-31 DIAGNOSIS — M4316 Spondylolisthesis, lumbar region: Secondary | ICD-10-CM | POA: Diagnosis not present

## 2023-01-31 DIAGNOSIS — M5137 Other intervertebral disc degeneration, lumbosacral region: Secondary | ICD-10-CM | POA: Diagnosis not present

## 2023-01-31 DIAGNOSIS — M5136 Other intervertebral disc degeneration, lumbar region: Secondary | ICD-10-CM | POA: Diagnosis not present

## 2023-01-31 DIAGNOSIS — M9905 Segmental and somatic dysfunction of pelvic region: Secondary | ICD-10-CM | POA: Diagnosis not present

## 2023-02-02 DIAGNOSIS — Q72891 Other reduction defects of right lower limb: Secondary | ICD-10-CM | POA: Diagnosis not present

## 2023-02-02 DIAGNOSIS — M5136 Other intervertebral disc degeneration, lumbar region: Secondary | ICD-10-CM | POA: Diagnosis not present

## 2023-02-02 DIAGNOSIS — M9904 Segmental and somatic dysfunction of sacral region: Secondary | ICD-10-CM | POA: Diagnosis not present

## 2023-02-02 DIAGNOSIS — M9903 Segmental and somatic dysfunction of lumbar region: Secondary | ICD-10-CM | POA: Diagnosis not present

## 2023-02-02 DIAGNOSIS — M9905 Segmental and somatic dysfunction of pelvic region: Secondary | ICD-10-CM | POA: Diagnosis not present

## 2023-02-02 DIAGNOSIS — M5431 Sciatica, right side: Secondary | ICD-10-CM | POA: Diagnosis not present

## 2023-02-02 DIAGNOSIS — M5137 Other intervertebral disc degeneration, lumbosacral region: Secondary | ICD-10-CM | POA: Diagnosis not present

## 2023-02-02 DIAGNOSIS — M4316 Spondylolisthesis, lumbar region: Secondary | ICD-10-CM | POA: Diagnosis not present

## 2023-02-06 DIAGNOSIS — M5136 Other intervertebral disc degeneration, lumbar region: Secondary | ICD-10-CM | POA: Diagnosis not present

## 2023-02-06 DIAGNOSIS — M9904 Segmental and somatic dysfunction of sacral region: Secondary | ICD-10-CM | POA: Diagnosis not present

## 2023-02-06 DIAGNOSIS — M9903 Segmental and somatic dysfunction of lumbar region: Secondary | ICD-10-CM | POA: Diagnosis not present

## 2023-02-06 DIAGNOSIS — M5137 Other intervertebral disc degeneration, lumbosacral region: Secondary | ICD-10-CM | POA: Diagnosis not present

## 2023-02-06 DIAGNOSIS — M9905 Segmental and somatic dysfunction of pelvic region: Secondary | ICD-10-CM | POA: Diagnosis not present

## 2023-02-06 DIAGNOSIS — M5431 Sciatica, right side: Secondary | ICD-10-CM | POA: Diagnosis not present

## 2023-02-06 DIAGNOSIS — M4316 Spondylolisthesis, lumbar region: Secondary | ICD-10-CM | POA: Diagnosis not present

## 2023-02-06 DIAGNOSIS — Q72891 Other reduction defects of right lower limb: Secondary | ICD-10-CM | POA: Diagnosis not present

## 2023-02-07 DIAGNOSIS — Q72891 Other reduction defects of right lower limb: Secondary | ICD-10-CM | POA: Diagnosis not present

## 2023-02-07 DIAGNOSIS — M9905 Segmental and somatic dysfunction of pelvic region: Secondary | ICD-10-CM | POA: Diagnosis not present

## 2023-02-07 DIAGNOSIS — M5137 Other intervertebral disc degeneration, lumbosacral region: Secondary | ICD-10-CM | POA: Diagnosis not present

## 2023-02-07 DIAGNOSIS — M4316 Spondylolisthesis, lumbar region: Secondary | ICD-10-CM | POA: Diagnosis not present

## 2023-02-07 DIAGNOSIS — M5431 Sciatica, right side: Secondary | ICD-10-CM | POA: Diagnosis not present

## 2023-02-07 DIAGNOSIS — M9904 Segmental and somatic dysfunction of sacral region: Secondary | ICD-10-CM | POA: Diagnosis not present

## 2023-02-07 DIAGNOSIS — M9903 Segmental and somatic dysfunction of lumbar region: Secondary | ICD-10-CM | POA: Diagnosis not present

## 2023-02-07 DIAGNOSIS — M5136 Other intervertebral disc degeneration, lumbar region: Secondary | ICD-10-CM | POA: Diagnosis not present

## 2023-02-09 DIAGNOSIS — M5137 Other intervertebral disc degeneration, lumbosacral region: Secondary | ICD-10-CM | POA: Diagnosis not present

## 2023-02-09 DIAGNOSIS — M9903 Segmental and somatic dysfunction of lumbar region: Secondary | ICD-10-CM | POA: Diagnosis not present

## 2023-02-09 DIAGNOSIS — M5136 Other intervertebral disc degeneration, lumbar region: Secondary | ICD-10-CM | POA: Diagnosis not present

## 2023-02-09 DIAGNOSIS — M5431 Sciatica, right side: Secondary | ICD-10-CM | POA: Diagnosis not present

## 2023-02-09 DIAGNOSIS — Q72891 Other reduction defects of right lower limb: Secondary | ICD-10-CM | POA: Diagnosis not present

## 2023-02-09 DIAGNOSIS — M4316 Spondylolisthesis, lumbar region: Secondary | ICD-10-CM | POA: Diagnosis not present

## 2023-02-09 DIAGNOSIS — M9905 Segmental and somatic dysfunction of pelvic region: Secondary | ICD-10-CM | POA: Diagnosis not present

## 2023-02-09 DIAGNOSIS — M9904 Segmental and somatic dysfunction of sacral region: Secondary | ICD-10-CM | POA: Diagnosis not present

## 2023-02-14 DIAGNOSIS — M9903 Segmental and somatic dysfunction of lumbar region: Secondary | ICD-10-CM | POA: Diagnosis not present

## 2023-02-14 DIAGNOSIS — Q72891 Other reduction defects of right lower limb: Secondary | ICD-10-CM | POA: Diagnosis not present

## 2023-02-14 DIAGNOSIS — M5136 Other intervertebral disc degeneration, lumbar region: Secondary | ICD-10-CM | POA: Diagnosis not present

## 2023-02-14 DIAGNOSIS — M5137 Other intervertebral disc degeneration, lumbosacral region: Secondary | ICD-10-CM | POA: Diagnosis not present

## 2023-02-14 DIAGNOSIS — M9905 Segmental and somatic dysfunction of pelvic region: Secondary | ICD-10-CM | POA: Diagnosis not present

## 2023-02-14 DIAGNOSIS — M4316 Spondylolisthesis, lumbar region: Secondary | ICD-10-CM | POA: Diagnosis not present

## 2023-02-14 DIAGNOSIS — M5431 Sciatica, right side: Secondary | ICD-10-CM | POA: Diagnosis not present

## 2023-02-14 DIAGNOSIS — M9904 Segmental and somatic dysfunction of sacral region: Secondary | ICD-10-CM | POA: Diagnosis not present

## 2023-02-15 DIAGNOSIS — Q72891 Other reduction defects of right lower limb: Secondary | ICD-10-CM | POA: Diagnosis not present

## 2023-02-15 DIAGNOSIS — M9903 Segmental and somatic dysfunction of lumbar region: Secondary | ICD-10-CM | POA: Diagnosis not present

## 2023-02-15 DIAGNOSIS — M5136 Other intervertebral disc degeneration, lumbar region: Secondary | ICD-10-CM | POA: Diagnosis not present

## 2023-02-15 DIAGNOSIS — M5431 Sciatica, right side: Secondary | ICD-10-CM | POA: Diagnosis not present

## 2023-02-15 DIAGNOSIS — M9905 Segmental and somatic dysfunction of pelvic region: Secondary | ICD-10-CM | POA: Diagnosis not present

## 2023-02-15 DIAGNOSIS — M5137 Other intervertebral disc degeneration, lumbosacral region: Secondary | ICD-10-CM | POA: Diagnosis not present

## 2023-02-15 DIAGNOSIS — M4316 Spondylolisthesis, lumbar region: Secondary | ICD-10-CM | POA: Diagnosis not present

## 2023-02-15 DIAGNOSIS — M9904 Segmental and somatic dysfunction of sacral region: Secondary | ICD-10-CM | POA: Diagnosis not present

## 2023-02-16 DIAGNOSIS — Q72891 Other reduction defects of right lower limb: Secondary | ICD-10-CM | POA: Diagnosis not present

## 2023-02-16 DIAGNOSIS — M9903 Segmental and somatic dysfunction of lumbar region: Secondary | ICD-10-CM | POA: Diagnosis not present

## 2023-02-16 DIAGNOSIS — M9905 Segmental and somatic dysfunction of pelvic region: Secondary | ICD-10-CM | POA: Diagnosis not present

## 2023-02-16 DIAGNOSIS — M5431 Sciatica, right side: Secondary | ICD-10-CM | POA: Diagnosis not present

## 2023-02-16 DIAGNOSIS — M4316 Spondylolisthesis, lumbar region: Secondary | ICD-10-CM | POA: Diagnosis not present

## 2023-02-16 DIAGNOSIS — M9904 Segmental and somatic dysfunction of sacral region: Secondary | ICD-10-CM | POA: Diagnosis not present

## 2023-02-16 DIAGNOSIS — M5137 Other intervertebral disc degeneration, lumbosacral region: Secondary | ICD-10-CM | POA: Diagnosis not present

## 2023-02-16 DIAGNOSIS — M5136 Other intervertebral disc degeneration, lumbar region: Secondary | ICD-10-CM | POA: Diagnosis not present

## 2023-02-28 DIAGNOSIS — M5137 Other intervertebral disc degeneration, lumbosacral region: Secondary | ICD-10-CM | POA: Diagnosis not present

## 2023-02-28 DIAGNOSIS — M9905 Segmental and somatic dysfunction of pelvic region: Secondary | ICD-10-CM | POA: Diagnosis not present

## 2023-02-28 DIAGNOSIS — M9904 Segmental and somatic dysfunction of sacral region: Secondary | ICD-10-CM | POA: Diagnosis not present

## 2023-02-28 DIAGNOSIS — M5136 Other intervertebral disc degeneration, lumbar region: Secondary | ICD-10-CM | POA: Diagnosis not present

## 2023-02-28 DIAGNOSIS — M5431 Sciatica, right side: Secondary | ICD-10-CM | POA: Diagnosis not present

## 2023-02-28 DIAGNOSIS — M9903 Segmental and somatic dysfunction of lumbar region: Secondary | ICD-10-CM | POA: Diagnosis not present

## 2023-02-28 DIAGNOSIS — Q72891 Other reduction defects of right lower limb: Secondary | ICD-10-CM | POA: Diagnosis not present

## 2023-02-28 DIAGNOSIS — M4316 Spondylolisthesis, lumbar region: Secondary | ICD-10-CM | POA: Diagnosis not present

## 2023-03-01 ENCOUNTER — Ambulatory Visit: Payer: Medicare Other | Admitting: Neurology

## 2023-03-02 DIAGNOSIS — M4316 Spondylolisthesis, lumbar region: Secondary | ICD-10-CM | POA: Diagnosis not present

## 2023-03-02 DIAGNOSIS — M5431 Sciatica, right side: Secondary | ICD-10-CM | POA: Diagnosis not present

## 2023-03-02 DIAGNOSIS — M9905 Segmental and somatic dysfunction of pelvic region: Secondary | ICD-10-CM | POA: Diagnosis not present

## 2023-03-02 DIAGNOSIS — Q72891 Other reduction defects of right lower limb: Secondary | ICD-10-CM | POA: Diagnosis not present

## 2023-03-02 DIAGNOSIS — M5137 Other intervertebral disc degeneration, lumbosacral region: Secondary | ICD-10-CM | POA: Diagnosis not present

## 2023-03-02 DIAGNOSIS — M5136 Other intervertebral disc degeneration, lumbar region: Secondary | ICD-10-CM | POA: Diagnosis not present

## 2023-03-02 DIAGNOSIS — M9903 Segmental and somatic dysfunction of lumbar region: Secondary | ICD-10-CM | POA: Diagnosis not present

## 2023-03-02 DIAGNOSIS — M9904 Segmental and somatic dysfunction of sacral region: Secondary | ICD-10-CM | POA: Diagnosis not present

## 2023-03-04 DIAGNOSIS — R0981 Nasal congestion: Secondary | ICD-10-CM | POA: Diagnosis not present

## 2023-03-04 DIAGNOSIS — R051 Acute cough: Secondary | ICD-10-CM | POA: Diagnosis not present

## 2023-03-04 DIAGNOSIS — R5383 Other fatigue: Secondary | ICD-10-CM | POA: Diagnosis not present

## 2023-03-04 DIAGNOSIS — U071 COVID-19: Secondary | ICD-10-CM | POA: Diagnosis not present

## 2023-03-06 ENCOUNTER — Telehealth: Payer: Self-pay

## 2023-03-06 NOTE — Telephone Encounter (Signed)
Pt has LM stating he needs to reschedule appt with Dr. Pearlean Brownie.

## 2023-03-13 ENCOUNTER — Other Ambulatory Visit: Payer: Self-pay

## 2023-03-13 ENCOUNTER — Encounter (HOSPITAL_BASED_OUTPATIENT_CLINIC_OR_DEPARTMENT_OTHER): Payer: Self-pay | Admitting: Emergency Medicine

## 2023-03-13 ENCOUNTER — Emergency Department (HOSPITAL_BASED_OUTPATIENT_CLINIC_OR_DEPARTMENT_OTHER)
Admission: EM | Admit: 2023-03-13 | Discharge: 2023-03-13 | Disposition: A | Discharge status: Home / Self Care | Payer: Medicare Other | Attending: Emergency Medicine | Admitting: Emergency Medicine

## 2023-03-13 DIAGNOSIS — I10 Essential (primary) hypertension: Secondary | ICD-10-CM | POA: Diagnosis not present

## 2023-03-13 DIAGNOSIS — R339 Retention of urine, unspecified: Secondary | ICD-10-CM | POA: Diagnosis not present

## 2023-03-13 DIAGNOSIS — Z79899 Other long term (current) drug therapy: Secondary | ICD-10-CM | POA: Insufficient documentation

## 2023-03-13 LAB — URINALYSIS, ROUTINE W REFLEX MICROSCOPIC
Bilirubin Urine: NEGATIVE
Glucose, UA: NEGATIVE mg/dL
Ketones, ur: NEGATIVE mg/dL
Leukocytes,Ua: NEGATIVE
Nitrite: NEGATIVE
Protein, ur: NEGATIVE mg/dL
Specific Gravity, Urine: 1.015 (ref 1.005–1.030)
pH: 5.5 (ref 5.0–8.0)

## 2023-03-13 LAB — URINALYSIS, MICROSCOPIC (REFLEX): Bacteria, UA: NONE SEEN

## 2023-03-13 MED ORDER — LIDOCAINE HCL URETHRAL/MUCOSAL 2 % EX GEL
1.0000 | Freq: Once | CUTANEOUS | Status: AC
  Administered 2023-03-13: 1 via URETHRAL
  Filled 2023-03-13: qty 11

## 2023-03-13 NOTE — ED Notes (Signed)
Leg bag placed on patient for home. Pt and family instructed how to empty bag.

## 2023-03-13 NOTE — ED Triage Notes (Signed)
Reports urinary retention since 1930 yesterday. He reports remote history of same. Denies other sx.

## 2023-03-13 NOTE — ED Provider Notes (Signed)
[facility] Provider Note   CSN: [id number] Arrival date & time: 03/13/23  [id number]     History  Chief Complaint  Patient presents with   Urinary Retention    George [name] is a 82 y.o. male.  Patient is a 82 year old male with past medical history of BPH, prior hemicolectomy, hypertension.  Patient presenting today for evaluation of urinary retention.  He has not been able to urinate since earlier this evening.  He feels distended and is if he has to urinate, but cannot.  No fevers or chills.  He reports this happened once before in the past, but resolved on its own.  The history is provided by the patient.       Home Medications Prior to Admission medications   Medication Sig Start Date End Date Taking? Authorizing Provider  ANDROGEL PUMP 20.25 MG/ACT (1.62%) GEL Apply 1 application topically daily.  09/08/14   [provider]  buPROPion (WELLBUTRIN XL) 150 MG 24 hr tablet Take by mouth.    [provider]  celecoxib (CELEBREX) 200 MG capsule Take 200 mg by mouth every other day.     [provider]  citalopram (CELEXA) 20 MG tablet Take 1 tablet by mouth daily. 11/03/17   [provider]  finasteride (PROSCAR) 5 MG tablet Take 5 mg by mouth every evening.     [provider]  memantine (NAMENDA) 10 MG tablet Take 1 tablet (10 mg total) by mouth 2 (two) times daily. 09/12/22   Micki Riley, MD  Multiple Vitamin (MULTIVITAMIN WITH MINERALS) TABS tablet Take 1 tablet by mouth daily.    [provider]  Multiple Vitamin (MULTIVITAMIN) tablet Take 1 tablet by mouth daily.    [provider]  pantoprazole (PROTONIX) 40 MG tablet Take 40 mg by mouth 2 (two) times daily.    [provider]  promethazine-dextromethorphan (PROMETHAZINE-DM) 6.25-15 MG/5ML syrup Take 5 mLs by mouth 4 (four) times daily as needed for cough.    [provider]  tadalafil (CIALIS) 5  MG tablet Take by mouth as needed. 12/12/18   [provider]  tamsulosin (FLOMAX) 0.4 MG CAPS capsule Take 0.4 mg by mouth daily.    [provider]  traMADol (ULTRAM) 50 MG tablet Take 1 tablet (50 mg total) by mouth every 6 (six) hours as needed. 10/18/21   Vanetta Mulders, MD  UNABLE TO FIND 10 mg. Med Name: alertec    [provider]      Allergies    Codeine and Doxycycline    Review of Systems   Review of Systems  All other systems reviewed and are negative.   Physical Exam Updated Vital Signs BP (!) 156/91 (BP Location: Right Arm)   Pulse 80   Temp 97.6 F (36.4 C)   Resp 20   Ht 5\' 8"  (1.727 m)   Wt 78.9 kg   SpO2 100%   BMI 26.46 kg/m  Physical Exam Vitals and nursing note reviewed.  Constitutional:      General: He is not in acute distress.    Appearance: He is well-developed. He is not diaphoretic.  HENT:     Head: Normocephalic and atraumatic.  Cardiovascular:     Rate and Rhythm: Normal rate and regular rhythm.     Heart sounds: No murmur heard.    No friction rub.  Pulmonary:     Effort: Pulmonary effort is normal. No respiratory distress.  Breath sounds: Normal breath sounds. No wheezing or rales.  Abdominal:     General: Bowel sounds are normal. There is no distension.     Palpations: Abdomen is soft.     Tenderness: There is abdominal tenderness.     Comments: There is some suprapubic tenderness.  Musculoskeletal:        General: Normal range of motion.     Cervical back: Normal range of motion and neck supple.  Skin:    General: Skin is warm and dry.  Neurological:     Mental Status: He is alert and oriented to person, place, and time.     Coordination: Coordination normal.     ED Results / Procedures / Treatments   Labs (all labs ordered are listed, but only abnormal results are displayed) Labs Reviewed - No data to display  EKG None  Radiology No results found.  Procedures Procedures    Medications  Ordered in ED Medications - No data to display  ED Course/ Medical Decision Making/ A&P  Patient presenting with urinary retention as described in the HPI.  He has a history of BPH.  Foley catheter inserted and 500 cc of urine obtained.  Urinalysis is clear showing no sign of infection.  Patient will be connected to a leg bag and is to follow-up with his urologist in the next 2 to 3 days.  Final Clinical Impression(s) / ED Diagnoses Final diagnoses:  None    Rx / DC Orders ED Discharge Orders     None         Geoffery Lyons, MD 03/13/23 [id number]

## 2023-03-13 NOTE — Discharge Instructions (Signed)
Follow-up with your urologist in the next 1 to 2 days.

## 2023-03-20 DIAGNOSIS — Z87898 Personal history of other specified conditions: Secondary | ICD-10-CM | POA: Diagnosis not present

## 2023-03-20 DIAGNOSIS — N401 Enlarged prostate with lower urinary tract symptoms: Secondary | ICD-10-CM | POA: Diagnosis not present

## 2023-03-20 DIAGNOSIS — N529 Male erectile dysfunction, unspecified: Secondary | ICD-10-CM | POA: Diagnosis not present

## 2023-03-20 DIAGNOSIS — E291 Testicular hypofunction: Secondary | ICD-10-CM | POA: Diagnosis not present

## 2023-03-20 DIAGNOSIS — N486 Induration penis plastica: Secondary | ICD-10-CM | POA: Diagnosis not present

## 2023-03-20 DIAGNOSIS — N138 Other obstructive and reflux uropathy: Secondary | ICD-10-CM | POA: Diagnosis not present

## 2023-03-20 DIAGNOSIS — R339 Retention of urine, unspecified: Secondary | ICD-10-CM | POA: Diagnosis not present

## 2023-03-21 DIAGNOSIS — D171 Benign lipomatous neoplasm of skin and subcutaneous tissue of trunk: Secondary | ICD-10-CM | POA: Diagnosis not present

## 2023-03-21 DIAGNOSIS — D2372 Other benign neoplasm of skin of left lower limb, including hip: Secondary | ICD-10-CM | POA: Diagnosis not present

## 2023-03-21 DIAGNOSIS — D1801 Hemangioma of skin and subcutaneous tissue: Secondary | ICD-10-CM | POA: Diagnosis not present

## 2023-03-21 DIAGNOSIS — I781 Nevus, non-neoplastic: Secondary | ICD-10-CM | POA: Diagnosis not present

## 2023-03-21 DIAGNOSIS — L814 Other melanin hyperpigmentation: Secondary | ICD-10-CM | POA: Diagnosis not present

## 2023-03-21 DIAGNOSIS — Z85828 Personal history of other malignant neoplasm of skin: Secondary | ICD-10-CM | POA: Diagnosis not present

## 2023-03-21 DIAGNOSIS — L821 Other seborrheic keratosis: Secondary | ICD-10-CM | POA: Diagnosis not present

## 2023-03-21 DIAGNOSIS — L578 Other skin changes due to chronic exposure to nonionizing radiation: Secondary | ICD-10-CM | POA: Diagnosis not present

## 2023-04-02 ENCOUNTER — Other Ambulatory Visit: Payer: Self-pay | Admitting: Neurology

## 2023-04-04 ENCOUNTER — Ambulatory Visit (INDEPENDENT_AMBULATORY_CARE_PROVIDER_SITE_OTHER): Payer: Medicare Other

## 2023-04-04 DIAGNOSIS — I495 Sick sinus syndrome: Secondary | ICD-10-CM | POA: Diagnosis not present

## 2023-04-06 LAB — CUP PACEART REMOTE DEVICE CHECK
Battery Remaining Longevity: 111 mo
Battery Voltage: 3 V
Brady Statistic AP VP Percent: 0.02 %
Brady Statistic AP VS Percent: 0.29 %
Brady Statistic AS VP Percent: 0.03 %
Brady Statistic AS VS Percent: 99.66 %
Brady Statistic RA Percent Paced: 0.29 %
Brady Statistic RV Percent Paced: 0.05 %
Date Time Interrogation Session: 20241022015604
Implantable Lead Connection Status: 753985
Implantable Lead Connection Status: 753985
Implantable Lead Implant Date: 20180126
Implantable Lead Implant Date: 20180126
Implantable Lead Location: 753859
Implantable Lead Location: 753860
Implantable Lead Model: 5076
Implantable Lead Model: 5076
Implantable Pulse Generator Implant Date: 20180126
Lead Channel Impedance Value: 304 Ohm
Lead Channel Impedance Value: 361 Ohm
Lead Channel Impedance Value: 437 Ohm
Lead Channel Impedance Value: 437 Ohm
Lead Channel Pacing Threshold Amplitude: 0.75 V
Lead Channel Pacing Threshold Amplitude: 0.75 V
Lead Channel Pacing Threshold Pulse Width: 0.4 ms
Lead Channel Pacing Threshold Pulse Width: 0.4 ms
Lead Channel Sensing Intrinsic Amplitude: 2.125 mV
Lead Channel Sensing Intrinsic Amplitude: 2.125 mV
Lead Channel Sensing Intrinsic Amplitude: 6.625 mV
Lead Channel Sensing Intrinsic Amplitude: 6.625 mV
Lead Channel Setting Pacing Amplitude: 2 V
Lead Channel Setting Pacing Amplitude: 2.5 V
Lead Channel Setting Pacing Pulse Width: 0.4 ms
Lead Channel Setting Sensing Sensitivity: 2 mV
Zone Setting Status: 755011
Zone Setting Status: 755011

## 2023-04-10 DIAGNOSIS — N529 Male erectile dysfunction, unspecified: Secondary | ICD-10-CM | POA: Diagnosis not present

## 2023-04-10 DIAGNOSIS — N401 Enlarged prostate with lower urinary tract symptoms: Secondary | ICD-10-CM | POA: Diagnosis not present

## 2023-04-10 DIAGNOSIS — E291 Testicular hypofunction: Secondary | ICD-10-CM | POA: Diagnosis not present

## 2023-04-10 DIAGNOSIS — N486 Induration penis plastica: Secondary | ICD-10-CM | POA: Diagnosis not present

## 2023-04-10 DIAGNOSIS — Z87898 Personal history of other specified conditions: Secondary | ICD-10-CM | POA: Diagnosis not present

## 2023-04-10 DIAGNOSIS — R339 Retention of urine, unspecified: Secondary | ICD-10-CM | POA: Diagnosis not present

## 2023-04-10 DIAGNOSIS — N138 Other obstructive and reflux uropathy: Secondary | ICD-10-CM | POA: Diagnosis not present

## 2023-04-24 NOTE — Progress Notes (Signed)
Remote pacemaker transmission.   

## 2023-04-25 ENCOUNTER — Encounter: Payer: Self-pay | Admitting: Cardiovascular Disease

## 2023-04-25 ENCOUNTER — Ambulatory Visit: Payer: Medicare Other | Attending: Cardiovascular Disease | Admitting: Cardiovascular Disease

## 2023-04-25 VITALS — BP 130/86 | HR 69 | Ht 68.0 in | Wt 175.8 lb

## 2023-04-25 DIAGNOSIS — I495 Sick sinus syndrome: Secondary | ICD-10-CM | POA: Diagnosis not present

## 2023-04-25 LAB — CUP PACEART INCLINIC DEVICE CHECK
Battery Remaining Longevity: 111 mo
Battery Voltage: 3 V
Brady Statistic AP VP Percent: 0.02 %
Brady Statistic AP VS Percent: 0.28 %
Brady Statistic AS VP Percent: 0.03 %
Brady Statistic AS VS Percent: 99.67 %
Brady Statistic RA Percent Paced: 0.28 %
Brady Statistic RV Percent Paced: 0.05 %
Date Time Interrogation Session: 20241112122906
Implantable Lead Connection Status: 753985
Implantable Lead Connection Status: 753985
Implantable Lead Implant Date: 20180126
Implantable Lead Implant Date: 20180126
Implantable Lead Location: 753859
Implantable Lead Location: 753860
Implantable Lead Model: 5076
Implantable Lead Model: 5076
Implantable Pulse Generator Implant Date: 20180126
Lead Channel Impedance Value: 323 Ohm
Lead Channel Impedance Value: 342 Ohm
Lead Channel Impedance Value: 418 Ohm
Lead Channel Impedance Value: 456 Ohm
Lead Channel Pacing Threshold Amplitude: 0.75 V
Lead Channel Pacing Threshold Amplitude: 0.75 V
Lead Channel Pacing Threshold Pulse Width: 0.4 ms
Lead Channel Pacing Threshold Pulse Width: 0.4 ms
Lead Channel Sensing Intrinsic Amplitude: 2.25 mV
Lead Channel Sensing Intrinsic Amplitude: 2.375 mV
Lead Channel Sensing Intrinsic Amplitude: 3.75 mV
Lead Channel Sensing Intrinsic Amplitude: 4.75 mV
Lead Channel Setting Pacing Amplitude: 2 V
Lead Channel Setting Pacing Amplitude: 2.5 V
Lead Channel Setting Pacing Pulse Width: 0.4 ms
Lead Channel Setting Sensing Sensitivity: 2 mV
Zone Setting Status: 755011
Zone Setting Status: 755011

## 2023-04-25 NOTE — Patient Instructions (Signed)

## 2023-04-25 NOTE — Progress Notes (Signed)
  Electrophysiology Office Note:    Date:  04/25/2023   ID:  George [name], DOB 1940-09-30, MRN [id number]  PCP:  Noberto Retort, MD   [facility] Providers Cardiologist:  None Electrophysiologist:  Maurice Small, MD     Referring MD: Noberto Retort, MD   History of Present Illness:    George [name] is a 82 y.o. male with a medical history significant for sinus node dysfunction and Medtronic dual-chamber pacemaker who presents for routine follow-up.     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  Prior to the device placement, he was having episodes of syncope.  These have resolved with pacemaker placement.  he has no device related complaints -- no new tenderness, drainage, redness.      Today, he reports that he is doing very well and has no complaints. He has not had palpitations or pre-syncope/  EKGs/Labs/Other Studies Reviewed Today:      EKG:   EKG Interpretation Date/Time:  Tuesday April 25 2023 11:07:24 EST Ventricular Rate:  69 PR Interval:  172 QRS Duration:  112 QT Interval:  406 QTC Calculation: 435 R Axis:   -48  Text Interpretation: Normal sinus rhythm Left axis deviation Incomplete left bundle branch block Minimal voltage criteria for LVH, may be normal variant ( Cornell product ) When compared with ECG of 09-Jul-2016 04:40, No significant change was found Confirmed by York Pellant [phone]) on 04/25/2023 11:33:57 AM     Physical Exam:    VS:  BP 130/86 (BP Location: Left Arm, Patient Position: Sitting, Cuff Size: Normal)   Pulse 69   Ht 5\' 8"  (1.727 m)   Wt 175 lb 12.8 oz (79.7 kg)   SpO2 97%   BMI 26.73 kg/m     Wt Readings from Last 3 Encounters:  04/25/23 175 lb 12.8 oz (79.7 kg)  03/13/23 174 lb (78.9 kg)  09/12/22 180 lb 12.8 oz (82 kg)     GEN: Well nourished, well developed in no acute distress CARDIAC: RRR, no murmurs, rubs, gallops The  device site is normal -- no tenderness, edema, drainage, redness, threatened erosion.  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  Normal function.  See Paceart for details He is not device dependent today    Signed, Maurice Small, MD  04/25/2023 11:38 AM    [facility]

## 2023-06-10 DIAGNOSIS — R051 Acute cough: Secondary | ICD-10-CM | POA: Diagnosis not present

## 2023-06-10 DIAGNOSIS — J018 Other acute sinusitis: Secondary | ICD-10-CM | POA: Diagnosis not present

## 2023-06-27 DIAGNOSIS — M170 Bilateral primary osteoarthritis of knee: Secondary | ICD-10-CM | POA: Diagnosis not present

## 2023-06-27 DIAGNOSIS — E291 Testicular hypofunction: Secondary | ICD-10-CM | POA: Diagnosis not present

## 2023-06-27 DIAGNOSIS — K219 Gastro-esophageal reflux disease without esophagitis: Secondary | ICD-10-CM | POA: Diagnosis not present

## 2023-06-27 DIAGNOSIS — N401 Enlarged prostate with lower urinary tract symptoms: Secondary | ICD-10-CM | POA: Diagnosis not present

## 2023-06-27 DIAGNOSIS — F3341 Major depressive disorder, recurrent, in partial remission: Secondary | ICD-10-CM | POA: Diagnosis not present

## 2023-06-27 DIAGNOSIS — E7800 Pure hypercholesterolemia, unspecified: Secondary | ICD-10-CM | POA: Diagnosis not present

## 2023-06-27 DIAGNOSIS — Z0000 Encounter for general adult medical examination without abnormal findings: Secondary | ICD-10-CM | POA: Diagnosis not present

## 2023-06-27 DIAGNOSIS — F09 Unspecified mental disorder due to known physiological condition: Secondary | ICD-10-CM | POA: Diagnosis not present

## 2023-06-28 DIAGNOSIS — H6122 Impacted cerumen, left ear: Secondary | ICD-10-CM | POA: Diagnosis not present

## 2023-07-04 ENCOUNTER — Ambulatory Visit (INDEPENDENT_AMBULATORY_CARE_PROVIDER_SITE_OTHER): Payer: Medicare Other

## 2023-07-04 DIAGNOSIS — I495 Sick sinus syndrome: Secondary | ICD-10-CM | POA: Diagnosis not present

## 2023-07-05 LAB — CUP PACEART REMOTE DEVICE CHECK
Battery Remaining Longevity: 111 mo
Battery Voltage: 3 V
Brady Statistic AP VP Percent: 0.02 %
Brady Statistic AP VS Percent: 0.36 %
Brady Statistic AS VP Percent: 0.03 %
Brady Statistic AS VS Percent: 99.59 %
Brady Statistic RA Percent Paced: 0.36 %
Brady Statistic RV Percent Paced: 0.05 %
Date Time Interrogation Session: 20250121011133
Implantable Lead Connection Status: 753985
Implantable Lead Connection Status: 753985
Implantable Lead Implant Date: 20180126
Implantable Lead Implant Date: 20180126
Implantable Lead Location: 753859
Implantable Lead Location: 753860
Implantable Lead Model: 5076
Implantable Lead Model: 5076
Implantable Pulse Generator Implant Date: 20180126
Lead Channel Impedance Value: 285 Ohm
Lead Channel Impedance Value: 323 Ohm
Lead Channel Impedance Value: 399 Ohm
Lead Channel Impedance Value: 418 Ohm
Lead Channel Pacing Threshold Amplitude: 0.75 V
Lead Channel Pacing Threshold Amplitude: 0.75 V
Lead Channel Pacing Threshold Pulse Width: 0.4 ms
Lead Channel Pacing Threshold Pulse Width: 0.4 ms
Lead Channel Sensing Intrinsic Amplitude: 2.125 mV
Lead Channel Sensing Intrinsic Amplitude: 2.125 mV
Lead Channel Sensing Intrinsic Amplitude: 4.875 mV
Lead Channel Sensing Intrinsic Amplitude: 4.875 mV
Lead Channel Setting Pacing Amplitude: 2 V
Lead Channel Setting Pacing Amplitude: 2.5 V
Lead Channel Setting Pacing Pulse Width: 0.4 ms
Lead Channel Setting Sensing Sensitivity: 2 mV
Zone Setting Status: 755011
Zone Setting Status: 755011

## 2023-07-07 DIAGNOSIS — J209 Acute bronchitis, unspecified: Secondary | ICD-10-CM | POA: Diagnosis not present

## 2023-07-10 ENCOUNTER — Encounter: Payer: Self-pay | Admitting: Cardiovascular Disease

## 2023-07-12 DIAGNOSIS — R590 Localized enlarged lymph nodes: Secondary | ICD-10-CM | POA: Diagnosis not present

## 2023-07-12 DIAGNOSIS — J029 Acute pharyngitis, unspecified: Secondary | ICD-10-CM | POA: Diagnosis not present

## 2023-07-12 DIAGNOSIS — J411 Mucopurulent chronic bronchitis: Secondary | ICD-10-CM | POA: Diagnosis not present

## 2023-07-13 DIAGNOSIS — R590 Localized enlarged lymph nodes: Secondary | ICD-10-CM | POA: Diagnosis not present

## 2023-07-13 DIAGNOSIS — R053 Chronic cough: Secondary | ICD-10-CM | POA: Diagnosis not present

## 2023-07-15 DIAGNOSIS — R053 Chronic cough: Secondary | ICD-10-CM | POA: Diagnosis not present

## 2023-07-15 DIAGNOSIS — Z03818 Encounter for observation for suspected exposure to other biological agents ruled out: Secondary | ICD-10-CM | POA: Diagnosis not present

## 2023-07-15 DIAGNOSIS — J101 Influenza due to other identified influenza virus with other respiratory manifestations: Secondary | ICD-10-CM | POA: Diagnosis not present

## 2023-07-15 DIAGNOSIS — R590 Localized enlarged lymph nodes: Secondary | ICD-10-CM | POA: Diagnosis not present

## 2023-08-16 NOTE — Progress Notes (Signed)
 Remote pacemaker transmission.

## 2023-08-16 NOTE — Addendum Note (Signed)
 Addended by: Geralyn Flash D on: 08/16/2023 09:56 AM   Modules accepted: Orders

## 2023-08-31 ENCOUNTER — Ambulatory Visit (INDEPENDENT_AMBULATORY_CARE_PROVIDER_SITE_OTHER): Payer: Medicare Other | Admitting: Neurology

## 2023-08-31 ENCOUNTER — Encounter: Payer: Self-pay | Admitting: Neurology

## 2023-08-31 VITALS — BP 136/74 | HR 69 | Ht 69.0 in | Wt 169.0 lb

## 2023-08-31 DIAGNOSIS — G301 Alzheimer's disease with late onset: Secondary | ICD-10-CM

## 2023-08-31 DIAGNOSIS — R413 Other amnesia: Secondary | ICD-10-CM

## 2023-08-31 DIAGNOSIS — G309 Alzheimer's disease, unspecified: Secondary | ICD-10-CM

## 2023-08-31 DIAGNOSIS — F0280 Dementia in other diseases classified elsewhere without behavioral disturbance: Secondary | ICD-10-CM

## 2023-08-31 NOTE — Patient Instructions (Signed)
 had a long discussion with the patient and his wife regarding his remote episode of acute confusional state being of unclear etiology possibly encephalopathy related to excessive drinking of cough syrup.  He continues to have memory and cognitive decline despite increasing Namenda to 10 mg twice daily.  Recommend further evaluation for early Alzheimer's using 80 in both profile, apolipoprotein E profile and amyloid PET scan and if positive may consider trying Kisunla or Lequembi I encouraged him to increase participation in cognitively challenging activities like solving crossword puzzles, playing bridge and sodoku.  We also discussed memory compensation strategies.  Will return for follow-up in the future in 4 months or call earlier if necessary.

## 2023-08-31 NOTE — Progress Notes (Signed)
 Will 1 of 2 patients back-to-back 5151 (qualified needed blood test to be positive George [name] does not personally know) is getting all his wife family comfortable and sent recently in July/when he ran out of [facility] [address] [place]. [place] [address] [phone]       OFFICE FOLLOW UP VISIT NOTE  George [name] Date of Birth:  08-31-1940 Medical Record Number:  [id number]   Referring MD: Johny Blamer  Reason for Referral: Encephalopathy  OZH:[id number] visit  04/16/2020 :George [name] is a 83 year old Caucasian male seen today for initial office consultation visit.  Is accompanied by his wife.  History is obtained from them and review of hospital referral notes and I personally reviewed available pertinent imaging films in PACS.  He has past medical history of sick sinus syndrome status post pacemaker and treated basal cell skin cancer and arthritis.  He was admitted to [facility] on 03/29/2020 to 04/04/2020.  He was found by his wife to be sitting and not responding on the floor with 2 empty bottles of cough syrup next to him.  He was found in the hospital to be quite confused and disoriented.  Initial impression was that he had Wernicke's encephalopathy and was a heavy drinker but the George [name] and wife both deny this.  Admission labs were pretty much unremarkable.  Subsequently underwent a CT scan of the head which showed no acute abnormality and MRI scan also showed no acute stroke no significant abnormality though it is significantly motion degraded and suboptimal quality.  He also had an EEG which was apparently normal but I do not have the actual report.  The George [name] gradually improved but to date does not have full memory of the event.  He remembers bits and pieces of the hospitalization but does not remember EMS taking him to the hospital.  Denied any headache at that time double vision, focal extremity weakness, numbness vertigo or diplopia.  He has no  known prior history of strokes, TIAs, seizures, significant head injury with loss of consciousness or any other neurological problems.  He states is done well but still has some short-term memory difficulties particularly related to the episode. Update 09/12/2022 : George [name] is referred back by Dr. Johny Blamer for evaluation for memory difficulties which are worsening.  He is accompanied by his wife today.  History is obtained from them and I personally reviewed pertinent available imaging films in PACS and electronic medical records and referral notes..  George [name] had this episode of encephalopathy in October 2021 and since then has had some mild memory and cognitive difficulties with her persisted.  George [name]'s wife seems to think that there may be slowly getting worse though the George [name] agrees.  He continues to have mild short-term memory difficulties and occasional trouble finding words so he can remember them later.  He started using the GPS while driving all the time and he gets lost.  He also not able to remember his medications and his wife has.  Reminded him daily.  He is otherwise independent in activities of daily living.  He denies any prior history of dementia in his family.  George [name] admits to drinking 3 beers a day and knows he needs to cut back.  He has been taking Namenda 5 mg twice daily several years has never tried going up to the full dose of 10 mg twice daily.  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  He had  surgery in the right eye which did not help but plans to have a second surgery on the same eyes.  On Mini-Mental status testing today scored 28/30 with 1 deficits in.  This is declined from 30/30 at last visit 2 years ago.  George [name] has not been bearing in regular activities that are cognitively challenging has not been using any memory compensation strategies. Update 08/31/2023 : He returns for follow-up today after his last visit nearly a year ago.  Is  accompanied by his wife.  He is tolerating increasing Namenda 10 mg twice daily but feels that his memory is slowly slipping.  He cannot remember names of familiar streets and has tough time driving though he has never gotten lost.  He often has to second-guess himself as he cannot remember recent information and appointments.  He does get disoriented in the evenings and does sundown in the bed and at times has hallucinations as per his wife.  He did have multiple illnesses in the last 1 including COVID, bronchitis and sinus infection.  He does need some help with activities of daily living.  On Mini-Mental status exam today he scored 25/30 which is declined from last visit 28/30 and on functional requestioning he scored 12 suggesting some disability.  He does not have family history of Alzheimer's would like to be evaluated for the same. ROS:   14 system review of systems is positive for confusion, hallucinations, agitation, disorientation, decreased memory, hiccups, gastritis and all other systems negative PMH:  Past Medical History:  Diagnosis Date   Arthritis    "qwhere" (07/08/2016)   Cancer (HCC)    basal cell skin cancer removed from nose  and pre cancerous hemicolectomy years ago    Depression    Sick sinus syndrome (HCC)    a. s/p MDT dual chamber PPM    Small bowel obstruction (HCC)     Social History:  Social History   Socioeconomic History   Marital status: Married    Spouse name: [name]   Number of children: Not on file   Years of education: Not on file   Highest education level: Not on file  Occupational History   Not on file  Tobacco Use   Smoking status: Former    Current packs/day: 0.00    Average packs/day: 2.0 packs/day for 20.0 years (40.0 ttl pk-yrs)    Types: Cigarettes    Start date: 1964    Quit date: 1984    Years since quitting: 41.2   Smokeless tobacco: Never  Vaping Use   Vaping status: Never Used  Substance and Sexual Activity   Alcohol use: Yes     Alcohol/week: 6.0 standard drinks of alcohol    Types: 6 Cans of beer per week   Drug use: No   Sexual activity: Not on file  Other Topics Concern   Not on file  Social History Narrative   Lives with wife    Right Handed   Drinks 2-3 cups caffeine daily   Social Drivers of Corporate investment banker Strain: Not on file  Food Insecurity: Not on file  Transportation Needs: Not on file  Physical Activity: Not on file  Stress: Not on file  Social Connections: Not on file  Intimate Partner Violence: Not on file    Medications:   Current Outpatient Medications on File Prior to Visit  Medication Sig Dispense Refill   ANDROGEL PUMP 20.25 MG/ACT (1.62%) GEL Apply 1 application topically daily.  buPROPion (WELLBUTRIN XL) 150 MG 24 hr tablet Take by mouth.     celecoxib (CELEBREX) 200 MG capsule Take 200 mg by mouth every other day.      citalopram (CELEXA) 20 MG tablet Take 1 tablet by mouth daily.     finasteride (PROSCAR) 5 MG tablet Take 5 mg by mouth every evening.      memantine (NAMENDA) 10 MG tablet TAKE ONE TABLET BY MOUTH TWICE DAILY 180 tablet 1   Multiple Vitamin (MULTIVITAMIN WITH MINERALS) TABS tablet Take 1 tablet by mouth daily.     Multiple Vitamin (MULTIVITAMIN) tablet Take 1 tablet by mouth daily.     pantoprazole (PROTONIX) 40 MG tablet Take 40 mg by mouth 2 (two) times daily.     promethazine-dextromethorphan (PROMETHAZINE-DM) 6.25-15 MG/5ML syrup Take 5 mLs by mouth 4 (four) times daily as needed for cough.     tadalafil (CIALIS) 5 MG tablet Take by mouth as needed.     tamsulosin (FLOMAX) 0.4 MG CAPS capsule Take 0.4 mg by mouth daily.     traMADol (ULTRAM) 50 MG tablet Take 1 tablet (50 mg total) by mouth every 6 (six) hours as needed. 15 tablet 0   UNABLE TO FIND 10 mg. Med Name: alertec     No current facility-administered medications on file prior to visit.    Allergies:   Allergies  Allergen Reactions   Codeine Other (See Comments)    incoherance    Doxycycline Other (See Comments)    Stomach upset    Physical Exam General: well developed, well nourished elderly Caucasian male, seated, in no evident distress Head: head normocephalic and atraumatic.   Neck: supple with no carotid or supraclavicular bruits Cardiovascular: regular rate and rhythm, no murmurs Musculoskeletal: no deformity Skin:  no rash/petichiae Vascular:  Normal pulses all extremities  Neurologic Exam Mental Status: Awake and fully alert. Oriented to place and time. Recent and remote memory intact. Attention span, concentration and fund of knowledge appropriate. Mood and affect appropriate.  Diminished recall 1/3.  Able to name 11 animals which can walk on 4 legs.  Clock drawing 3/4.  On Mini-Mental status exam he scored 25/30 ( last visit 28/30 ).  On functional activity questionnaire score 12 suggestive of mild functional disability impairment Cranial Nerves: Fundoscopic exam reveals sharp disc margins. Pupils equal, briskly reactive to light. Extraocular movements full without nystagmus. Visual fields full to confrontation. Hearing intact. Facial sensation intact. Face, tongue, palate moves normally and symmetrically.  Motor: Normal bulk and tone. Normal strength in all tested extremity muscles. Sensory.: intact to touch , pinprick , position and vibratory sensation.  Coordination: Rapid alternating movements normal in all extremities. Finger-to-nose and heel-to-shin performed accurately bilaterally. Gait and Station: Arises from chair without difficulty. Stance is normal. Gait demonstrates normal stride length and balance . Able to heel, toe and tandem walk without difficulty.  Reflexes: 1+ and symmetric. Toes downgoing.        08/31/2023    2:23 PM 09/12/2022    9:03 AM 04/16/2020    8:59 AM  MMSE - Mini Mental State Exam  Orientation to time 4 5 5   Orientation to Place 4 5 5   Registration 3 3 3   Attention/ Calculation 3 5 5   Recall 2 2 3   Language- name 2  objects 2 2 2   Language- repeat 1 1 1   Language- follow 3 step command 3 3 3   Language- read & follow direction 1 1 1   Write a sentence 1  1 1  Copy design 1 0 1  Total score 25 28 30       ASSESSMENT: 82 year old Caucasian male with transient episode of acute confusional state and psychosis following drinking excess cough syrup of unclear etiology in October 2021 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     PLAN: I had a long discussion with the George [name] and his wife regarding his remote episode of acute confusional state being of unclear etiology possibly encephalopathy related to excessive drinking of cough syrup.  He continues to have memory and cognitive decline despite increasing Namenda to 10 mg twice daily.  Recommend further evaluation for early Alzheimer's using 80 in both profile, apolipoprotein E profile and amyloid PET scan and if positive may consider trying [place] or [place] I encouraged him to increase participation in cognitively challenging activities like solving crossword puzzles, playing bridge and sodoku.  We also discussed memory compensation strategies.  Will return for follow-up in the future in 4 months or call earlier if necessary. Greater than 50% time during this 40-minute prolonged  visit was spent on counseling and coordination of care about his episode of acute confusion and answering questions. Delia Heady, MD Note: This document was prepared with digital dictation and possible smart phrase technology. Any transcriptional errors that result from this process are unintentional.

## 2023-09-06 ENCOUNTER — Telehealth: Payer: Self-pay | Admitting: Neurology

## 2023-09-06 LAB — ATN PROFILE
A -- Beta-amyloid 42/40 Ratio: 0.097 — ABNORMAL LOW (ref >0.102)
Beta-amyloid 40: 242.38 pg/mL
Beta-amyloid 42: 23.42 pg/mL
N -- NfL, Plasma: 5.23 pg/mL (ref 0.00–11.55)
T -- p-tau181: 1.81 pg/mL — ABNORMAL HIGH (ref 0.00–0.97)

## 2023-09-06 LAB — APOE ALZHEIMER'S RISK

## 2023-09-06 NOTE — Telephone Encounter (Signed)
 no auth required sent to [facility] [phone]

## 2023-09-12 DIAGNOSIS — H0100A Unspecified blepharitis right eye, upper and lower eyelids: Secondary | ICD-10-CM | POA: Diagnosis not present

## 2023-09-12 DIAGNOSIS — M19041 Primary osteoarthritis, right hand: Secondary | ICD-10-CM | POA: Diagnosis not present

## 2023-09-12 DIAGNOSIS — F3341 Major depressive disorder, recurrent, in partial remission: Secondary | ICD-10-CM | POA: Diagnosis not present

## 2023-09-12 DIAGNOSIS — H0100B Unspecified blepharitis left eye, upper and lower eyelids: Secondary | ICD-10-CM | POA: Diagnosis not present

## 2023-09-12 DIAGNOSIS — F09 Unspecified mental disorder due to known physiological condition: Secondary | ICD-10-CM | POA: Diagnosis not present

## 2023-09-12 DIAGNOSIS — H9313 Tinnitus, bilateral: Secondary | ICD-10-CM | POA: Diagnosis not present

## 2023-09-12 DIAGNOSIS — M19042 Primary osteoarthritis, left hand: Secondary | ICD-10-CM | POA: Diagnosis not present

## 2023-09-14 NOTE — Progress Notes (Signed)
 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

## 2023-09-15 ENCOUNTER — Encounter (HOSPITAL_COMMUNITY)
Admission: RE | Admit: 2023-09-15 | Discharge: 2023-09-15 | Disposition: A | Discharge status: Home / Self Care | Source: Ambulatory Visit | Attending: Neurology | Admitting: Neurology

## 2023-09-15 DIAGNOSIS — R4189 Other symptoms and signs involving cognitive functions and awareness: Secondary | ICD-10-CM | POA: Diagnosis not present

## 2023-09-15 DIAGNOSIS — G309 Alzheimer's disease, unspecified: Secondary | ICD-10-CM | POA: Diagnosis not present

## 2023-09-15 MED ORDER — FLORBETAPIR F 18 500-1900 MBQ/ML IV SOLN
10.0500 | Freq: Once | INTRAVENOUS | Status: AC
  Administered 2023-09-15: 10.05 via INTRAVENOUS

## 2023-09-25 NOTE — Progress Notes (Signed)
 Kindly inform the patient that amyloid PET scan of the brain is suggestive of Alzheimer's brain pathology likely as the cause of his memory loss.

## 2023-09-27 ENCOUNTER — Other Ambulatory Visit: Payer: Self-pay | Admitting: Neurology

## 2023-10-03 ENCOUNTER — Ambulatory Visit: Payer: Medicare Other

## 2023-10-03 DIAGNOSIS — I495 Sick sinus syndrome: Secondary | ICD-10-CM | POA: Diagnosis not present

## 2023-10-03 LAB — CUP PACEART REMOTE DEVICE CHECK
Battery Remaining Longevity: 108 mo
Battery Voltage: 3 V
Brady Statistic AP VP Percent: 0.02 %
Brady Statistic AP VS Percent: 0.34 %
Brady Statistic AS VP Percent: 0.04 %
Brady Statistic AS VS Percent: 99.6 %
Brady Statistic RA Percent Paced: 0.36 %
Brady Statistic RV Percent Paced: 0.06 %
Date Time Interrogation Session: 20250422015850
Implantable Lead Connection Status: 753985
Implantable Lead Connection Status: 753985
Implantable Lead Implant Date: 20180126
Implantable Lead Implant Date: 20180126
Implantable Lead Location: 753859
Implantable Lead Location: 753860
Implantable Lead Model: 5076
Implantable Lead Model: 5076
Implantable Pulse Generator Implant Date: 20180126
Lead Channel Impedance Value: 323 Ohm
Lead Channel Impedance Value: 342 Ohm
Lead Channel Impedance Value: 418 Ohm
Lead Channel Impedance Value: 437 Ohm
Lead Channel Pacing Threshold Amplitude: 0.75 V
Lead Channel Pacing Threshold Amplitude: 0.75 V
Lead Channel Pacing Threshold Pulse Width: 0.4 ms
Lead Channel Pacing Threshold Pulse Width: 0.4 ms
Lead Channel Sensing Intrinsic Amplitude: 2.125 mV
Lead Channel Sensing Intrinsic Amplitude: 2.125 mV
Lead Channel Sensing Intrinsic Amplitude: 4.875 mV
Lead Channel Sensing Intrinsic Amplitude: 4.875 mV
Lead Channel Setting Pacing Amplitude: 2 V
Lead Channel Setting Pacing Amplitude: 2.5 V
Lead Channel Setting Pacing Pulse Width: 0.4 ms
Lead Channel Setting Sensing Sensitivity: 2 mV
Zone Setting Status: 755011
Zone Setting Status: 755011

## 2023-10-05 DIAGNOSIS — M65352 Trigger finger, left little finger: Secondary | ICD-10-CM | POA: Diagnosis not present

## 2023-10-05 DIAGNOSIS — M1812 Unilateral primary osteoarthritis of first carpometacarpal joint, left hand: Secondary | ICD-10-CM | POA: Diagnosis not present

## 2023-10-12 ENCOUNTER — Encounter: Payer: Self-pay | Admitting: Cardiovascular Disease

## 2023-10-16 DIAGNOSIS — M51372 Other intervertebral disc degeneration, lumbosacral region with discogenic back pain and lower extremity pain: Secondary | ICD-10-CM | POA: Diagnosis not present

## 2023-10-16 DIAGNOSIS — M5135 Other intervertebral disc degeneration, thoracolumbar region: Secondary | ICD-10-CM | POA: Diagnosis not present

## 2023-10-16 DIAGNOSIS — M9903 Segmental and somatic dysfunction of lumbar region: Secondary | ICD-10-CM | POA: Diagnosis not present

## 2023-10-16 DIAGNOSIS — Q72891 Other reduction defects of right lower limb: Secondary | ICD-10-CM | POA: Diagnosis not present

## 2023-10-16 DIAGNOSIS — M9904 Segmental and somatic dysfunction of sacral region: Secondary | ICD-10-CM | POA: Diagnosis not present

## 2023-10-16 DIAGNOSIS — M5432 Sciatica, left side: Secondary | ICD-10-CM | POA: Diagnosis not present

## 2023-10-16 DIAGNOSIS — M5431 Sciatica, right side: Secondary | ICD-10-CM | POA: Diagnosis not present

## 2023-10-16 DIAGNOSIS — M9905 Segmental and somatic dysfunction of pelvic region: Secondary | ICD-10-CM | POA: Diagnosis not present

## 2023-10-16 DIAGNOSIS — M51362 Other intervertebral disc degeneration, lumbar region with discogenic back pain and lower extremity pain: Secondary | ICD-10-CM | POA: Diagnosis not present

## 2023-10-17 DIAGNOSIS — M51372 Other intervertebral disc degeneration, lumbosacral region with discogenic back pain and lower extremity pain: Secondary | ICD-10-CM | POA: Diagnosis not present

## 2023-10-17 DIAGNOSIS — M5431 Sciatica, right side: Secondary | ICD-10-CM | POA: Diagnosis not present

## 2023-10-17 DIAGNOSIS — M9904 Segmental and somatic dysfunction of sacral region: Secondary | ICD-10-CM | POA: Diagnosis not present

## 2023-10-17 DIAGNOSIS — M9905 Segmental and somatic dysfunction of pelvic region: Secondary | ICD-10-CM | POA: Diagnosis not present

## 2023-10-17 DIAGNOSIS — M5135 Other intervertebral disc degeneration, thoracolumbar region: Secondary | ICD-10-CM | POA: Diagnosis not present

## 2023-10-17 DIAGNOSIS — M51362 Other intervertebral disc degeneration, lumbar region with discogenic back pain and lower extremity pain: Secondary | ICD-10-CM | POA: Diagnosis not present

## 2023-10-17 DIAGNOSIS — Q72891 Other reduction defects of right lower limb: Secondary | ICD-10-CM | POA: Diagnosis not present

## 2023-10-17 DIAGNOSIS — M9903 Segmental and somatic dysfunction of lumbar region: Secondary | ICD-10-CM | POA: Diagnosis not present

## 2023-10-17 DIAGNOSIS — M5432 Sciatica, left side: Secondary | ICD-10-CM | POA: Diagnosis not present

## 2023-10-19 DIAGNOSIS — Q72891 Other reduction defects of right lower limb: Secondary | ICD-10-CM | POA: Diagnosis not present

## 2023-10-19 DIAGNOSIS — M9904 Segmental and somatic dysfunction of sacral region: Secondary | ICD-10-CM | POA: Diagnosis not present

## 2023-10-19 DIAGNOSIS — M51362 Other intervertebral disc degeneration, lumbar region with discogenic back pain and lower extremity pain: Secondary | ICD-10-CM | POA: Diagnosis not present

## 2023-10-19 DIAGNOSIS — M5431 Sciatica, right side: Secondary | ICD-10-CM | POA: Diagnosis not present

## 2023-10-19 DIAGNOSIS — M5135 Other intervertebral disc degeneration, thoracolumbar region: Secondary | ICD-10-CM | POA: Diagnosis not present

## 2023-10-19 DIAGNOSIS — M5432 Sciatica, left side: Secondary | ICD-10-CM | POA: Diagnosis not present

## 2023-10-19 DIAGNOSIS — M51372 Other intervertebral disc degeneration, lumbosacral region with discogenic back pain and lower extremity pain: Secondary | ICD-10-CM | POA: Diagnosis not present

## 2023-10-19 DIAGNOSIS — M9905 Segmental and somatic dysfunction of pelvic region: Secondary | ICD-10-CM | POA: Diagnosis not present

## 2023-10-19 DIAGNOSIS — M9903 Segmental and somatic dysfunction of lumbar region: Secondary | ICD-10-CM | POA: Diagnosis not present

## 2023-10-23 DIAGNOSIS — Q72891 Other reduction defects of right lower limb: Secondary | ICD-10-CM | POA: Diagnosis not present

## 2023-10-23 DIAGNOSIS — M9904 Segmental and somatic dysfunction of sacral region: Secondary | ICD-10-CM | POA: Diagnosis not present

## 2023-10-23 DIAGNOSIS — M5135 Other intervertebral disc degeneration, thoracolumbar region: Secondary | ICD-10-CM | POA: Diagnosis not present

## 2023-10-23 DIAGNOSIS — M9903 Segmental and somatic dysfunction of lumbar region: Secondary | ICD-10-CM | POA: Diagnosis not present

## 2023-10-23 DIAGNOSIS — M9905 Segmental and somatic dysfunction of pelvic region: Secondary | ICD-10-CM | POA: Diagnosis not present

## 2023-10-23 DIAGNOSIS — M51362 Other intervertebral disc degeneration, lumbar region with discogenic back pain and lower extremity pain: Secondary | ICD-10-CM | POA: Diagnosis not present

## 2023-10-23 DIAGNOSIS — M5431 Sciatica, right side: Secondary | ICD-10-CM | POA: Diagnosis not present

## 2023-10-23 DIAGNOSIS — M5432 Sciatica, left side: Secondary | ICD-10-CM | POA: Diagnosis not present

## 2023-10-23 DIAGNOSIS — M51372 Other intervertebral disc degeneration, lumbosacral region with discogenic back pain and lower extremity pain: Secondary | ICD-10-CM | POA: Diagnosis not present

## 2023-10-24 DIAGNOSIS — Q72891 Other reduction defects of right lower limb: Secondary | ICD-10-CM | POA: Diagnosis not present

## 2023-10-24 DIAGNOSIS — M9904 Segmental and somatic dysfunction of sacral region: Secondary | ICD-10-CM | POA: Diagnosis not present

## 2023-10-24 DIAGNOSIS — M5135 Other intervertebral disc degeneration, thoracolumbar region: Secondary | ICD-10-CM | POA: Diagnosis not present

## 2023-10-24 DIAGNOSIS — M51362 Other intervertebral disc degeneration, lumbar region with discogenic back pain and lower extremity pain: Secondary | ICD-10-CM | POA: Diagnosis not present

## 2023-10-24 DIAGNOSIS — M5431 Sciatica, right side: Secondary | ICD-10-CM | POA: Diagnosis not present

## 2023-10-24 DIAGNOSIS — M5432 Sciatica, left side: Secondary | ICD-10-CM | POA: Diagnosis not present

## 2023-10-24 DIAGNOSIS — M51372 Other intervertebral disc degeneration, lumbosacral region with discogenic back pain and lower extremity pain: Secondary | ICD-10-CM | POA: Diagnosis not present

## 2023-10-24 DIAGNOSIS — M9903 Segmental and somatic dysfunction of lumbar region: Secondary | ICD-10-CM | POA: Diagnosis not present

## 2023-10-24 DIAGNOSIS — M9905 Segmental and somatic dysfunction of pelvic region: Secondary | ICD-10-CM | POA: Diagnosis not present

## 2023-10-26 DIAGNOSIS — M51372 Other intervertebral disc degeneration, lumbosacral region with discogenic back pain and lower extremity pain: Secondary | ICD-10-CM | POA: Diagnosis not present

## 2023-10-26 DIAGNOSIS — Q72891 Other reduction defects of right lower limb: Secondary | ICD-10-CM | POA: Diagnosis not present

## 2023-10-26 DIAGNOSIS — M5432 Sciatica, left side: Secondary | ICD-10-CM | POA: Diagnosis not present

## 2023-10-26 DIAGNOSIS — M51362 Other intervertebral disc degeneration, lumbar region with discogenic back pain and lower extremity pain: Secondary | ICD-10-CM | POA: Diagnosis not present

## 2023-10-26 DIAGNOSIS — M9903 Segmental and somatic dysfunction of lumbar region: Secondary | ICD-10-CM | POA: Diagnosis not present

## 2023-10-26 DIAGNOSIS — M5431 Sciatica, right side: Secondary | ICD-10-CM | POA: Diagnosis not present

## 2023-10-26 DIAGNOSIS — M9904 Segmental and somatic dysfunction of sacral region: Secondary | ICD-10-CM | POA: Diagnosis not present

## 2023-10-26 DIAGNOSIS — M9905 Segmental and somatic dysfunction of pelvic region: Secondary | ICD-10-CM | POA: Diagnosis not present

## 2023-10-26 DIAGNOSIS — M5135 Other intervertebral disc degeneration, thoracolumbar region: Secondary | ICD-10-CM | POA: Diagnosis not present

## 2023-10-30 DIAGNOSIS — M9903 Segmental and somatic dysfunction of lumbar region: Secondary | ICD-10-CM | POA: Diagnosis not present

## 2023-10-30 DIAGNOSIS — M5432 Sciatica, left side: Secondary | ICD-10-CM | POA: Diagnosis not present

## 2023-10-30 DIAGNOSIS — M5431 Sciatica, right side: Secondary | ICD-10-CM | POA: Diagnosis not present

## 2023-10-30 DIAGNOSIS — M51372 Other intervertebral disc degeneration, lumbosacral region with discogenic back pain and lower extremity pain: Secondary | ICD-10-CM | POA: Diagnosis not present

## 2023-10-30 DIAGNOSIS — M9905 Segmental and somatic dysfunction of pelvic region: Secondary | ICD-10-CM | POA: Diagnosis not present

## 2023-10-30 DIAGNOSIS — M9904 Segmental and somatic dysfunction of sacral region: Secondary | ICD-10-CM | POA: Diagnosis not present

## 2023-10-30 DIAGNOSIS — M51362 Other intervertebral disc degeneration, lumbar region with discogenic back pain and lower extremity pain: Secondary | ICD-10-CM | POA: Diagnosis not present

## 2023-10-30 DIAGNOSIS — Q72891 Other reduction defects of right lower limb: Secondary | ICD-10-CM | POA: Diagnosis not present

## 2023-10-30 DIAGNOSIS — M5135 Other intervertebral disc degeneration, thoracolumbar region: Secondary | ICD-10-CM | POA: Diagnosis not present

## 2023-10-31 DIAGNOSIS — Q72891 Other reduction defects of right lower limb: Secondary | ICD-10-CM | POA: Diagnosis not present

## 2023-10-31 DIAGNOSIS — M51372 Other intervertebral disc degeneration, lumbosacral region with discogenic back pain and lower extremity pain: Secondary | ICD-10-CM | POA: Diagnosis not present

## 2023-10-31 DIAGNOSIS — M5431 Sciatica, right side: Secondary | ICD-10-CM | POA: Diagnosis not present

## 2023-10-31 DIAGNOSIS — M9905 Segmental and somatic dysfunction of pelvic region: Secondary | ICD-10-CM | POA: Diagnosis not present

## 2023-10-31 DIAGNOSIS — M9903 Segmental and somatic dysfunction of lumbar region: Secondary | ICD-10-CM | POA: Diagnosis not present

## 2023-10-31 DIAGNOSIS — M5432 Sciatica, left side: Secondary | ICD-10-CM | POA: Diagnosis not present

## 2023-10-31 DIAGNOSIS — M51362 Other intervertebral disc degeneration, lumbar region with discogenic back pain and lower extremity pain: Secondary | ICD-10-CM | POA: Diagnosis not present

## 2023-10-31 DIAGNOSIS — M5135 Other intervertebral disc degeneration, thoracolumbar region: Secondary | ICD-10-CM | POA: Diagnosis not present

## 2023-10-31 DIAGNOSIS — M9904 Segmental and somatic dysfunction of sacral region: Secondary | ICD-10-CM | POA: Diagnosis not present

## 2023-11-02 DIAGNOSIS — M9904 Segmental and somatic dysfunction of sacral region: Secondary | ICD-10-CM | POA: Diagnosis not present

## 2023-11-02 DIAGNOSIS — M51372 Other intervertebral disc degeneration, lumbosacral region with discogenic back pain and lower extremity pain: Secondary | ICD-10-CM | POA: Diagnosis not present

## 2023-11-02 DIAGNOSIS — M5432 Sciatica, left side: Secondary | ICD-10-CM | POA: Diagnosis not present

## 2023-11-02 DIAGNOSIS — M9903 Segmental and somatic dysfunction of lumbar region: Secondary | ICD-10-CM | POA: Diagnosis not present

## 2023-11-02 DIAGNOSIS — Q72891 Other reduction defects of right lower limb: Secondary | ICD-10-CM | POA: Diagnosis not present

## 2023-11-02 DIAGNOSIS — M5431 Sciatica, right side: Secondary | ICD-10-CM | POA: Diagnosis not present

## 2023-11-02 DIAGNOSIS — M51362 Other intervertebral disc degeneration, lumbar region with discogenic back pain and lower extremity pain: Secondary | ICD-10-CM | POA: Diagnosis not present

## 2023-11-02 DIAGNOSIS — M5135 Other intervertebral disc degeneration, thoracolumbar region: Secondary | ICD-10-CM | POA: Diagnosis not present

## 2023-11-02 DIAGNOSIS — M9905 Segmental and somatic dysfunction of pelvic region: Secondary | ICD-10-CM | POA: Diagnosis not present

## 2023-11-07 ENCOUNTER — Ambulatory Visit: Payer: Medicare Other | Admitting: Neurology

## 2023-11-07 DIAGNOSIS — M9905 Segmental and somatic dysfunction of pelvic region: Secondary | ICD-10-CM | POA: Diagnosis not present

## 2023-11-07 DIAGNOSIS — Q72891 Other reduction defects of right lower limb: Secondary | ICD-10-CM | POA: Diagnosis not present

## 2023-11-07 DIAGNOSIS — M9904 Segmental and somatic dysfunction of sacral region: Secondary | ICD-10-CM | POA: Diagnosis not present

## 2023-11-07 DIAGNOSIS — F3341 Major depressive disorder, recurrent, in partial remission: Secondary | ICD-10-CM | POA: Diagnosis not present

## 2023-11-07 DIAGNOSIS — R5383 Other fatigue: Secondary | ICD-10-CM | POA: Diagnosis not present

## 2023-11-07 DIAGNOSIS — M51362 Other intervertebral disc degeneration, lumbar region with discogenic back pain and lower extremity pain: Secondary | ICD-10-CM | POA: Diagnosis not present

## 2023-11-07 DIAGNOSIS — M51372 Other intervertebral disc degeneration, lumbosacral region with discogenic back pain and lower extremity pain: Secondary | ICD-10-CM | POA: Diagnosis not present

## 2023-11-07 DIAGNOSIS — M9903 Segmental and somatic dysfunction of lumbar region: Secondary | ICD-10-CM | POA: Diagnosis not present

## 2023-11-07 DIAGNOSIS — M5135 Other intervertebral disc degeneration, thoracolumbar region: Secondary | ICD-10-CM | POA: Diagnosis not present

## 2023-11-07 DIAGNOSIS — M5432 Sciatica, left side: Secondary | ICD-10-CM | POA: Diagnosis not present

## 2023-11-07 DIAGNOSIS — M5431 Sciatica, right side: Secondary | ICD-10-CM | POA: Diagnosis not present

## 2023-11-08 DIAGNOSIS — M65352 Trigger finger, left little finger: Secondary | ICD-10-CM | POA: Diagnosis not present

## 2023-11-08 DIAGNOSIS — M1812 Unilateral primary osteoarthritis of first carpometacarpal joint, left hand: Secondary | ICD-10-CM | POA: Diagnosis not present

## 2023-11-09 DIAGNOSIS — Q72891 Other reduction defects of right lower limb: Secondary | ICD-10-CM | POA: Diagnosis not present

## 2023-11-09 DIAGNOSIS — M51362 Other intervertebral disc degeneration, lumbar region with discogenic back pain and lower extremity pain: Secondary | ICD-10-CM | POA: Diagnosis not present

## 2023-11-09 DIAGNOSIS — M51372 Other intervertebral disc degeneration, lumbosacral region with discogenic back pain and lower extremity pain: Secondary | ICD-10-CM | POA: Diagnosis not present

## 2023-11-09 DIAGNOSIS — M5135 Other intervertebral disc degeneration, thoracolumbar region: Secondary | ICD-10-CM | POA: Diagnosis not present

## 2023-11-09 DIAGNOSIS — M9905 Segmental and somatic dysfunction of pelvic region: Secondary | ICD-10-CM | POA: Diagnosis not present

## 2023-11-09 DIAGNOSIS — M5432 Sciatica, left side: Secondary | ICD-10-CM | POA: Diagnosis not present

## 2023-11-09 DIAGNOSIS — M9904 Segmental and somatic dysfunction of sacral region: Secondary | ICD-10-CM | POA: Diagnosis not present

## 2023-11-09 DIAGNOSIS — M5431 Sciatica, right side: Secondary | ICD-10-CM | POA: Diagnosis not present

## 2023-11-09 DIAGNOSIS — M9903 Segmental and somatic dysfunction of lumbar region: Secondary | ICD-10-CM | POA: Diagnosis not present

## 2023-11-10 DIAGNOSIS — M9905 Segmental and somatic dysfunction of pelvic region: Secondary | ICD-10-CM | POA: Diagnosis not present

## 2023-11-10 DIAGNOSIS — M5135 Other intervertebral disc degeneration, thoracolumbar region: Secondary | ICD-10-CM | POA: Diagnosis not present

## 2023-11-10 DIAGNOSIS — M51362 Other intervertebral disc degeneration, lumbar region with discogenic back pain and lower extremity pain: Secondary | ICD-10-CM | POA: Diagnosis not present

## 2023-11-10 DIAGNOSIS — M9904 Segmental and somatic dysfunction of sacral region: Secondary | ICD-10-CM | POA: Diagnosis not present

## 2023-11-10 DIAGNOSIS — M9903 Segmental and somatic dysfunction of lumbar region: Secondary | ICD-10-CM | POA: Diagnosis not present

## 2023-11-10 DIAGNOSIS — M5431 Sciatica, right side: Secondary | ICD-10-CM | POA: Diagnosis not present

## 2023-11-10 DIAGNOSIS — M5432 Sciatica, left side: Secondary | ICD-10-CM | POA: Diagnosis not present

## 2023-11-10 DIAGNOSIS — Q72891 Other reduction defects of right lower limb: Secondary | ICD-10-CM | POA: Diagnosis not present

## 2023-11-10 DIAGNOSIS — M51372 Other intervertebral disc degeneration, lumbosacral region with discogenic back pain and lower extremity pain: Secondary | ICD-10-CM | POA: Diagnosis not present

## 2023-11-11 DIAGNOSIS — N401 Enlarged prostate with lower urinary tract symptoms: Secondary | ICD-10-CM | POA: Diagnosis not present

## 2023-11-11 DIAGNOSIS — E7800 Pure hypercholesterolemia, unspecified: Secondary | ICD-10-CM | POA: Diagnosis not present

## 2023-11-11 DIAGNOSIS — F3341 Major depressive disorder, recurrent, in partial remission: Secondary | ICD-10-CM | POA: Diagnosis not present

## 2023-11-11 DIAGNOSIS — M170 Bilateral primary osteoarthritis of knee: Secondary | ICD-10-CM | POA: Diagnosis not present

## 2023-11-14 DIAGNOSIS — M5135 Other intervertebral disc degeneration, thoracolumbar region: Secondary | ICD-10-CM | POA: Diagnosis not present

## 2023-11-14 DIAGNOSIS — M9903 Segmental and somatic dysfunction of lumbar region: Secondary | ICD-10-CM | POA: Diagnosis not present

## 2023-11-14 DIAGNOSIS — M9904 Segmental and somatic dysfunction of sacral region: Secondary | ICD-10-CM | POA: Diagnosis not present

## 2023-11-14 DIAGNOSIS — M9905 Segmental and somatic dysfunction of pelvic region: Secondary | ICD-10-CM | POA: Diagnosis not present

## 2023-11-14 DIAGNOSIS — M5432 Sciatica, left side: Secondary | ICD-10-CM | POA: Diagnosis not present

## 2023-11-14 DIAGNOSIS — M51362 Other intervertebral disc degeneration, lumbar region with discogenic back pain and lower extremity pain: Secondary | ICD-10-CM | POA: Diagnosis not present

## 2023-11-14 DIAGNOSIS — M51372 Other intervertebral disc degeneration, lumbosacral region with discogenic back pain and lower extremity pain: Secondary | ICD-10-CM | POA: Diagnosis not present

## 2023-11-14 DIAGNOSIS — Q72891 Other reduction defects of right lower limb: Secondary | ICD-10-CM | POA: Diagnosis not present

## 2023-11-14 DIAGNOSIS — M5431 Sciatica, right side: Secondary | ICD-10-CM | POA: Diagnosis not present

## 2023-11-16 DIAGNOSIS — M9905 Segmental and somatic dysfunction of pelvic region: Secondary | ICD-10-CM | POA: Diagnosis not present

## 2023-11-16 DIAGNOSIS — M9904 Segmental and somatic dysfunction of sacral region: Secondary | ICD-10-CM | POA: Diagnosis not present

## 2023-11-16 DIAGNOSIS — M9903 Segmental and somatic dysfunction of lumbar region: Secondary | ICD-10-CM | POA: Diagnosis not present

## 2023-11-16 DIAGNOSIS — M5431 Sciatica, right side: Secondary | ICD-10-CM | POA: Diagnosis not present

## 2023-11-16 DIAGNOSIS — M5135 Other intervertebral disc degeneration, thoracolumbar region: Secondary | ICD-10-CM | POA: Diagnosis not present

## 2023-11-16 DIAGNOSIS — Q72891 Other reduction defects of right lower limb: Secondary | ICD-10-CM | POA: Diagnosis not present

## 2023-11-16 DIAGNOSIS — M51372 Other intervertebral disc degeneration, lumbosacral region with discogenic back pain and lower extremity pain: Secondary | ICD-10-CM | POA: Diagnosis not present

## 2023-11-16 DIAGNOSIS — M51362 Other intervertebral disc degeneration, lumbar region with discogenic back pain and lower extremity pain: Secondary | ICD-10-CM | POA: Diagnosis not present

## 2023-11-16 DIAGNOSIS — M5432 Sciatica, left side: Secondary | ICD-10-CM | POA: Diagnosis not present

## 2023-11-16 NOTE — Progress Notes (Signed)
 Remote pacemaker transmission.

## 2023-11-28 DIAGNOSIS — M5431 Sciatica, right side: Secondary | ICD-10-CM | POA: Diagnosis not present

## 2023-11-28 DIAGNOSIS — M5432 Sciatica, left side: Secondary | ICD-10-CM | POA: Diagnosis not present

## 2023-11-28 DIAGNOSIS — M51372 Other intervertebral disc degeneration, lumbosacral region with discogenic back pain and lower extremity pain: Secondary | ICD-10-CM | POA: Diagnosis not present

## 2023-11-28 DIAGNOSIS — M51362 Other intervertebral disc degeneration, lumbar region with discogenic back pain and lower extremity pain: Secondary | ICD-10-CM | POA: Diagnosis not present

## 2023-11-28 DIAGNOSIS — Q72891 Other reduction defects of right lower limb: Secondary | ICD-10-CM | POA: Diagnosis not present

## 2023-11-28 DIAGNOSIS — M9905 Segmental and somatic dysfunction of pelvic region: Secondary | ICD-10-CM | POA: Diagnosis not present

## 2023-11-28 DIAGNOSIS — M9904 Segmental and somatic dysfunction of sacral region: Secondary | ICD-10-CM | POA: Diagnosis not present

## 2023-11-28 DIAGNOSIS — M5135 Other intervertebral disc degeneration, thoracolumbar region: Secondary | ICD-10-CM | POA: Diagnosis not present

## 2023-11-28 DIAGNOSIS — M9903 Segmental and somatic dysfunction of lumbar region: Secondary | ICD-10-CM | POA: Diagnosis not present

## 2023-11-29 DIAGNOSIS — M5432 Sciatica, left side: Secondary | ICD-10-CM | POA: Diagnosis not present

## 2023-11-29 DIAGNOSIS — M5135 Other intervertebral disc degeneration, thoracolumbar region: Secondary | ICD-10-CM | POA: Diagnosis not present

## 2023-11-29 DIAGNOSIS — Q72891 Other reduction defects of right lower limb: Secondary | ICD-10-CM | POA: Diagnosis not present

## 2023-11-29 DIAGNOSIS — M9903 Segmental and somatic dysfunction of lumbar region: Secondary | ICD-10-CM | POA: Diagnosis not present

## 2023-11-29 DIAGNOSIS — M5431 Sciatica, right side: Secondary | ICD-10-CM | POA: Diagnosis not present

## 2023-11-29 DIAGNOSIS — M51362 Other intervertebral disc degeneration, lumbar region with discogenic back pain and lower extremity pain: Secondary | ICD-10-CM | POA: Diagnosis not present

## 2023-11-29 DIAGNOSIS — M9905 Segmental and somatic dysfunction of pelvic region: Secondary | ICD-10-CM | POA: Diagnosis not present

## 2023-11-29 DIAGNOSIS — M9904 Segmental and somatic dysfunction of sacral region: Secondary | ICD-10-CM | POA: Diagnosis not present

## 2023-11-29 DIAGNOSIS — M51372 Other intervertebral disc degeneration, lumbosacral region with discogenic back pain and lower extremity pain: Secondary | ICD-10-CM | POA: Diagnosis not present

## 2023-12-11 DIAGNOSIS — E7800 Pure hypercholesterolemia, unspecified: Secondary | ICD-10-CM | POA: Diagnosis not present

## 2023-12-11 DIAGNOSIS — F3341 Major depressive disorder, recurrent, in partial remission: Secondary | ICD-10-CM | POA: Diagnosis not present

## 2023-12-11 DIAGNOSIS — N401 Enlarged prostate with lower urinary tract symptoms: Secondary | ICD-10-CM | POA: Diagnosis not present

## 2023-12-11 DIAGNOSIS — M170 Bilateral primary osteoarthritis of knee: Secondary | ICD-10-CM | POA: Diagnosis not present

## 2023-12-14 DIAGNOSIS — E291 Testicular hypofunction: Secondary | ICD-10-CM | POA: Diagnosis not present

## 2023-12-20 ENCOUNTER — Telehealth: Payer: Self-pay | Admitting: Neurology

## 2023-12-20 NOTE — Telephone Encounter (Signed)
 Appointment details confirmed

## 2023-12-21 ENCOUNTER — Ambulatory Visit (INDEPENDENT_AMBULATORY_CARE_PROVIDER_SITE_OTHER): Admitting: Neurology

## 2023-12-21 ENCOUNTER — Encounter: Payer: Self-pay | Admitting: Neurology

## 2023-12-21 VITALS — BP 104/68 | HR 82 | Ht 69.0 in | Wt 168.6 lb

## 2023-12-21 DIAGNOSIS — R413 Other amnesia: Secondary | ICD-10-CM | POA: Diagnosis not present

## 2023-12-21 DIAGNOSIS — G309 Alzheimer's disease, unspecified: Secondary | ICD-10-CM

## 2023-12-21 NOTE — Progress Notes (Signed)
 ran out of [facility] [address] [place]. [place] [address] [phone]       OFFICE FOLLOW UP VISIT NOTE  George [name] Date of Birth:  [birth year] Medical Record Number:  [id number]   Referring MD: Elsie Lesches  Reason for Referral: Encephalopathy  YEP:Pwpupjo visit  04/16/2020 :George [name] is a 83 year old Caucasian male seen today for initial office consultation visit.  Is accompanied by his wife.  History is obtained from them and review of hospital referral notes and I personally reviewed available pertinent imaging films in PACS.  He has past medical history of sick sinus syndrome status post pacemaker and treated basal cell skin cancer and arthritis.  He was admitted to [facility] on 03/29/2020 to 04/04/2020.  He was found by his wife to be sitting and not responding on the floor with 2 empty bottles of cough syrup next to him.  He was found in the hospital to be quite confused and disoriented.  Initial impression was that he had Wernicke's encephalopathy and was a heavy drinker but the George [name] and wife both deny this.  Admission labs were pretty much unremarkable.  Subsequently underwent a CT scan of the head which showed no acute abnormality and MRI scan also showed no acute stroke no significant abnormality though it is significantly motion degraded and suboptimal quality.  He also had an EEG which was apparently normal but I do not have the actual report.  The George [name] gradually improved but to date does not have full memory of the event.  He remembers bits and pieces of the hospitalization but does not remember EMS taking him to the hospital.  Denied any headache at that time double vision, focal extremity weakness, numbness vertigo or diplopia.  He has no known prior history of strokes, TIAs, seizures, significant head injury with loss of consciousness or any other neurological problems.  He states is done well but still has some short-term memory  difficulties particularly related to the episode. Update 09/12/2022 : George [name] is referred back by Dr. Elsie Lesches for evaluation for memory difficulties which are worsening.  He is accompanied by his wife today.  History is obtained from them and I personally reviewed pertinent available imaging films in PACS and electronic medical records and referral notes..  George [name] had this episode of encephalopathy in October 2021 and since then has had some mild memory and cognitive difficulties with her persisted.  George [name]'s wife seems to think that there may be slowly getting worse though the George [name] agrees.  He continues to have mild short-term memory difficulties and occasional trouble finding words so he can remember them later.  He started using the GPS while driving all the time and he gets lost.  He also not able to remember his medications and his wife has.  Reminded him daily.  He is otherwise independent in activities of daily living.  He denies any prior history of dementia in his family.  George [name] admits to drinking 3 beers a day and knows he needs to cut back.  He has been taking Namenda  5 mg twice daily several years has never tried going up to the full dose of 10 mg twice daily.  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  He had surgery in the right eye which did not help but plans to have a second surgery on the same eyes.  On Mini-Mental status testing today scored 28/30 with 1 deficits in.  This is declined from 30/30 at last visit 2 years ago.  George [name] has not been bearing in regular activities that are cognitively challenging has not been using any memory compensation strategies. Update 08/31/2023 : He returns for follow-up today after his last visit nearly a year ago.  Is accompanied by his wife.  He is tolerating increasing Namenda  10 mg twice daily but feels that his memory is slowly slipping.  He cannot remember names of familiar streets and has tough time  driving though he has never gotten lost.  He often has to second-guess himself as he cannot remember recent information and appointments.  He does get disoriented in the evenings and does sundown in the bed and at times has hallucinations as per his wife.  He did have multiple illnesses in the last 1 including COVID, bronchitis and sinus infection.  He does need some help with activities of daily living.  On Mini-Mental status exam today he scored 25/30 which is declined from last visit 28/30 and on functional requestioning he scored 12 suggesting some disability.  He does not have family history of Alzheimer's would like to be evaluated for the same. Update 12/21/2023 : He returns for follow-up after last visit 4 months ago.  He is accompanied by his wife and daughter.  George [name] continues to have progressive decline in his memory and forgets name of family streets and is having increased time driving and has to use GPS almost constantly.  He does get disoriented and confused towards the evenings but denies any hallucinations, delusions, agitation or unsafe behavior.  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  He also had low beta 42/40  tau ratio and elevated B12 which is suggestive of high likelihood of Alzheimer's pathology in the brain.  PET amyloid scan on/4/25 was also positive suggesting high possibility of amyloid related pathology in the brain.  George [name] has been on Namenda  10 mg twice daily which is tolerating well without side effects but continues to have cognitive as well as functional decline.  Family is interested in being considered for treatment with the new antiamyloid therapies for early Alzheimer's ROS:   14 system review of systems is positive for confusion, hallucinations, agitation, disorientation, decreased memory, hiccups, gastritis and all other systems negative PMH:  Past Medical  History:  Diagnosis Date   Arthritis    qwhere (07/08/2016)   Cancer (HCC)    basal cell skin cancer removed from nose  and pre cancerous hemicolectomy years ago    Depression    Sick sinus syndrome (HCC)    a. s/p MDT dual chamber PPM    Small bowel obstruction (HCC)     Social History:  Social History   Socioeconomic History   Marital status: Married    Spouse name: [name]   Number of children: Not on file   Years of education: Not on file   Highest education level: Not on file  Occupational History   Not on file  Tobacco Use   Smoking status: Former    Current packs/day: 0.00    Average packs/day: 2.0 packs/day for 20.0 years (40.0 ttl pk-yrs)    Types: Cigarettes    Start date: 1964    Quit date: 1984    Years since quitting: 41.5   Smokeless tobacco: Never  Vaping Use   Vaping status: Never Used  Substance and Sexual Activity  Alcohol use: Yes    Alcohol/week: 3.0 standard drinks of alcohol    Types: 2 Cans of beer, 1 Standard drinks or equivalent per week   Drug use: No   Sexual activity: Not on file  Other Topics Concern   Not on file  Social History Narrative   Lives with wife    Right Handed   Drinks 2-3 cups caffeine daily   Social Drivers of Corporate investment banker Strain: Not on file  Food Insecurity: Not on file  Transportation Needs: Not on file  Physical Activity: Not on file  Stress: Not on file  Social Connections: Not on file  Intimate Partner Violence: Not on file    Medications:   Current Outpatient Medications on File Prior to Visit  Medication Sig Dispense Refill   ANDROGEL  PUMP 20.25 MG/ACT (1.62%) GEL Apply 1 application topically daily.      buPROPion  (WELLBUTRIN  XL) 150 MG 24 hr tablet Take by mouth.     celecoxib  (CELEBREX ) 200 MG capsule Take 200 mg by mouth every other day.      cholecalciferol (VITAMIN D3) 25 MCG (1000 UNIT) tablet Take 1,000 Units by mouth daily.     citalopram  (CELEXA ) 20 MG tablet Take 1 tablet by  mouth daily.     finasteride  (PROSCAR ) 5 MG tablet Take 5 mg by mouth every evening.      memantine  (NAMENDA ) 10 MG tablet TAKE ONE TABLET BY MOUTH TWICE DAILY 180 tablet 0   Multiple Vitamin (MULTIVITAMIN WITH MINERALS) TABS tablet Take 1 tablet by mouth daily.     Multiple Vitamin (MULTIVITAMIN) tablet Take 1 tablet by mouth daily.     pantoprazole  (PROTONIX ) 40 MG tablet Take 40 mg by mouth 2 (two) times daily.     promethazine -dextromethorphan (PROMETHAZINE -DM) 6.25-15 MG/5ML syrup Take 5 mLs by mouth 4 (four) times daily as needed for cough.     sildenafil (VIAGRA) 50 MG tablet Take 50 mg by mouth as needed.     tamsulosin  (FLOMAX ) 0.4 MG CAPS capsule Take 0.4 mg by mouth daily.     traMADol  (ULTRAM ) 50 MG tablet Take 1 tablet (50 mg total) by mouth every 6 (six) hours as needed. 15 tablet 0   tadalafil (CIALIS) 5 MG tablet Take by mouth as needed.     UNABLE TO FIND 10 mg. Med Name: alertec     No current facility-administered medications on file prior to visit.    Allergies:   Allergies  Allergen Reactions   Codeine Other (See Comments)    incoherance   Doxycycline Other (See Comments)    Stomach upset    Physical Exam General: well developed, well nourished elderly Caucasian male, seated, in no evident distress Head: head normocephalic and atraumatic.   Neck: supple with no carotid or supraclavicular bruits Cardiovascular: regular rate and rhythm, no murmurs Musculoskeletal: no deformity Skin:  no rash/petichiae Vascular:  Normal pulses all extremities  Neurologic Exam Mental Status: Awake and fully alert. Oriented to place and time. Recent and remote memory intact. Attention span, concentration and fund of knowledge appropriate. Mood and affect appropriate.  Diminished recall 1/3.  Able to name 15 animals which can walk on 4 legs.  Clock drawing 3/4.  On Mini-Mental status exam he scored 25/30 ( last visit 28/30 ).  On functional activity questionnaire score 14 suggestive  of mild functional disability impairment Cranial Nerves: Fundoscopic exam reveals sharp disc margins. Pupils equal, briskly reactive to light. Extraocular movements full without nystagmus.  Visual fields full to confrontation. Hearing intact. Facial sensation intact. Face, tongue, palate moves normally and symmetrically.  Motor: Normal bulk and tone. Normal strength in all tested extremity muscles. Sensory.: intact to touch , pinprick , position and vibratory sensation.  Coordination: Rapid alternating movements normal in all extremities. Finger-to-nose and heel-to-shin performed accurately bilaterally. Gait and Station: Arises from chair without difficulty. Stance is normal. Gait demonstrates normal stride length and balance . Able to heel, toe and tandem walk without difficulty.  Reflexes: 1+ and symmetric. Toes downgoing.        12/21/2023    1:28 PM 08/31/2023    2:23 PM 09/12/2022    9:03 AM 04/16/2020    8:59 AM  MMSE - Mini Mental State Exam  Orientation to time 4 4 5 5   Orientation to Place 5 4 5 5   Registration 3 3 3 3   Attention/ Calculation 3 3 5 5   Recall 1 2 2 3   Language- name 2 objects 2 2 2 2   Language- repeat 1 1 1 1   Language- follow 3 step command 3 3 3 3   Language- read & follow direction 1 1 1 1   Write a sentence 1 1 1 1   Copy design 0 1 0 1  Total score 24 25 28 30       ASSESSMENT: 83 year old Caucasian male with transient episode of acute confusional state and psychosis following drinking excess cough syrup of unclear etiology in October 2021 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     PLAN: I had a long discussion with the George [name], 2 daughters and his wife regarding his his progressive memory and cognitive worsening and discussed results of lab work and amyloid PET scan now  suggesting high risk for Alzheimer's dementia.  He continues to have memory and cognitive decline despite increasing Namenda  to 10 mg twice daily.  Recommend starting Kisunla treatment after getting insurance approval.  I have discussed possible small risk of intracerebral hemorrhage and cerebral edema with the George [name] and family and stressed on the need for close follow-up in the office and plans with treatment regimen and answered questions.  George [name] will need a repeat MRI scan of the brain prior to starting Kisunla. I encouraged him to increase participation in cognitively challenging activities like solving crossword puzzles, playing bridge and sodoku.  We also discussed memory compensation strategies.  George [name] wants to drive and family is concerned about his skills hence I will refer him to a licensed motor driving school for a very comprehensive testing about his driving ability.  Will return for follow-up in the future in 6 months or call earlier if necessary. Greater than 50% time during this 40-minute prolonged  visit was spent on counseling and coordination of care about his progressive memory loss and dementia and early Alzheimer's discussion about treatment options and answering questions. Eather Popp, MD Note: This document was prepared with digital dictation and possible smart phrase technology. Any transcriptional errors that result from this process are unintentional.

## 2023-12-21 NOTE — Patient Instructions (Addendum)
 I had a long discussion with the patient, 2 daughters and his wife regarding his his progressive memory and cognitive worsening and discussed results of lab work and amyloid PET scan now suggesting high risk for Alzheimer's dementia.  He continues to have memory and cognitive decline despite increasing Namenda  to 10 mg twice daily.  Recommend starting Kisunla treatment after getting insurance approval.  I have discussed possible small risk of intracerebral hemorrhage and cerebral edema with the patient and family and stressed on the need for close follow-up in the office and plans with treatment regimen and answered questions.  I encouraged him to increase participation in cognitively challenging activities like solving crossword puzzles, playing bridge and sodoku.  We also discussed memory compensation strategies.  Will return for follow-up in the future in 6 months or call earlier if necessary.   KISUNLA (Donanemab)  Injection What is this medication? DONANEMAB (doe NAN e mab) treats Alzheimer disease. It works by decreasing the buildup of amyloid, a protein that may cause Alzheimer disease. This may slow down the worsening of symptoms. It is a monoclonal antibody. This medicine may be used for other purposes; ask your health care provider or pharmacist if you have questions. COMMON BRAND NAME(S): KISUNLA What should I tell my care team before I take this medication? They need to know if you have any of these conditions: Taking a blood thinner An unusual or allergic reaction to donanemab, other medications, foods, dyes, or preservatives Pregnant or trying to get pregnant Breastfeeding How should I use this medication? This medication is infused into a vein. It is given by your care team in a hospital or clinic setting. A special MedGuide will be given to you before each treatment. Be sure to read this information carefully each time. Talk to your care team about the use of this medication in  children. Special care may be needed. Overdosage: If you think you have taken too much of this medicine contact a poison control center or emergency room at once. NOTE: This medicine is only for you. Do not share this medicine with others. What if I miss a dose? Keep appointments for follow-up doses. It is important not to miss your dose. Call your care team if you are unable to keep an appointment. What may interact with this medication? Interactions have not been studied. This list may not describe all possible interactions. Give your health care provider a list of all the medicines, herbs, non-prescription drugs, or dietary supplements you use. Also tell them if you smoke, drink alcohol, or use illegal drugs. Some items may interact with your medicine. What should I watch for while using this medication? Visit your care team for regular checks on your progress. Tell your care team if your symptoms do not start to get better or if they get worse. This medication can cause a serious side effect called amyloid related imaging abnormalities (ARIA). ARIA can cause swelling or bleeding in the brain. Some people have a genetic risk factor that increases the risk of ARIA. Your care team may test you for this risk factor. The risk of bleeding in the brain is increased in people who take blood thinners. Talk to your care team if you take medications to prevent or treat blood clots. You will have imaging scans before and during your treatment to help your care team monitor for ARIA. Contact your care team right away if you have a severe headache, worsening confusion, dizziness, change in vision, nausea, trouble walking,  or seizures. What side effects may I notice from receiving this medication? Side effects that you should report to your care team as soon as possible: Allergic reactions or angioedema--skin rash, itching or hives, swelling of the face, eyes, lips, tongue, arms, or legs, trouble swallowing or  breathing Headache, worsening confusion, dizziness, change in vision, nausea, seizures Infusion reactions--chest pain, shortness of breath or trouble breathing, feeling faint or lightheaded Side effects that usually do not require medical attention (report these to your care team if they continue or are bothersome): Headache This list may not describe all possible side effects. Call your doctor for medical advice about side effects. You may report side effects to FDA at 1-800-FDA-1088. Where should I keep my medication? This medication is given in a hospital or clinic. It will not be stored at home. NOTE: This sheet is a summary. It may not cover all possible information. If you have questions about this medicine, talk to your doctor, pharmacist, or health care provider.  2024 Elsevier/Gold Standard (2023-05-12 00:00:00)

## 2023-12-25 ENCOUNTER — Telehealth: Payer: Self-pay | Admitting: Neurology

## 2023-12-25 NOTE — Telephone Encounter (Signed)
 Dr Rosemarie discussed starting Kisunla with the patient. Paperwork completed and given to [facility] for them to proceed forward with insurance authorization.

## 2023-12-25 NOTE — Telephone Encounter (Signed)
 no auth required sent to [facility] [phone]

## 2023-12-27 DIAGNOSIS — F02A0 Dementia in other diseases classified elsewhere, mild, without behavioral disturbance, psychotic disturbance, mood disturbance, and anxiety: Secondary | ICD-10-CM | POA: Diagnosis not present

## 2023-12-27 DIAGNOSIS — N401 Enlarged prostate with lower urinary tract symptoms: Secondary | ICD-10-CM | POA: Diagnosis not present

## 2023-12-27 DIAGNOSIS — R634 Abnormal weight loss: Secondary | ICD-10-CM | POA: Diagnosis not present

## 2023-12-27 DIAGNOSIS — M170 Bilateral primary osteoarthritis of knee: Secondary | ICD-10-CM | POA: Diagnosis not present

## 2023-12-27 DIAGNOSIS — G301 Alzheimer's disease with late onset: Secondary | ICD-10-CM | POA: Diagnosis not present

## 2023-12-27 DIAGNOSIS — K219 Gastro-esophageal reflux disease without esophagitis: Secondary | ICD-10-CM | POA: Diagnosis not present

## 2023-12-27 DIAGNOSIS — Z5181 Encounter for therapeutic drug level monitoring: Secondary | ICD-10-CM | POA: Diagnosis not present

## 2023-12-27 DIAGNOSIS — E7800 Pure hypercholesterolemia, unspecified: Secondary | ICD-10-CM | POA: Diagnosis not present

## 2023-12-27 DIAGNOSIS — E291 Testicular hypofunction: Secondary | ICD-10-CM | POA: Diagnosis not present

## 2023-12-27 DIAGNOSIS — F3341 Major depressive disorder, recurrent, in partial remission: Secondary | ICD-10-CM | POA: Diagnosis not present

## 2024-01-02 ENCOUNTER — Ambulatory Visit: Payer: Medicare Other

## 2024-01-02 DIAGNOSIS — I495 Sick sinus syndrome: Secondary | ICD-10-CM | POA: Diagnosis not present

## 2024-01-03 LAB — CUP PACEART REMOTE DEVICE CHECK
Battery Remaining Longevity: 107 mo
Battery Voltage: 3 V
Brady Statistic AP VP Percent: 0.01 %
Brady Statistic AP VS Percent: 0.37 %
Brady Statistic AS VP Percent: 0.04 %
Brady Statistic AS VS Percent: 99.58 %
Brady Statistic RA Percent Paced: 0.37 %
Brady Statistic RV Percent Paced: 0.05 %
Date Time Interrogation Session: 20250722015656
Implantable Lead Connection Status: 753985
Implantable Lead Connection Status: 753985
Implantable Lead Implant Date: 20180126
Implantable Lead Implant Date: 20180126
Implantable Lead Location: 753859
Implantable Lead Location: 753860
Implantable Lead Model: 5076
Implantable Lead Model: 5076
Implantable Pulse Generator Implant Date: 20180126
Lead Channel Impedance Value: 285 Ohm
Lead Channel Impedance Value: 304 Ohm
Lead Channel Impedance Value: 380 Ohm
Lead Channel Impedance Value: 418 Ohm
Lead Channel Pacing Threshold Amplitude: 0.625 V
Lead Channel Pacing Threshold Amplitude: 0.875 V
Lead Channel Pacing Threshold Pulse Width: 0.4 ms
Lead Channel Pacing Threshold Pulse Width: 0.4 ms
Lead Channel Sensing Intrinsic Amplitude: 2.25 mV
Lead Channel Sensing Intrinsic Amplitude: 2.25 mV
Lead Channel Sensing Intrinsic Amplitude: 3.75 mV
Lead Channel Sensing Intrinsic Amplitude: 3.75 mV
Lead Channel Setting Pacing Amplitude: 2 V
Lead Channel Setting Pacing Amplitude: 2.5 V
Lead Channel Setting Pacing Pulse Width: 0.4 ms
Lead Channel Setting Sensing Sensitivity: 2 mV
Zone Setting Status: 755011
Zone Setting Status: 755011

## 2024-01-07 ENCOUNTER — Ambulatory Visit: Payer: Self-pay | Admitting: Cardiovascular Disease

## 2024-01-08 ENCOUNTER — Telehealth: Payer: Self-pay | Admitting: Neurology

## 2024-01-08 NOTE — Telephone Encounter (Signed)
 Pt called wanting to speak to the nurse regarding updates of tests that were ordered for him. Please call back when available.

## 2024-01-10 NOTE — Telephone Encounter (Signed)
 Called and spoke with the wife. The patient has completed all of the necessary testing to move forward with kisunla with the exception of MRI scheduled 8/11. Advised once that is complete we can make sure it is ok to move forward with Kisunla. She verbalized understanding. The next apt was scheduled in June with Dr Rosemarie. Advised we needed a 6 mth check up for memory testing and FAQ for the pt for insurance auth.. this can be with NP. Scheduled with Harlene NP January 2026. She was appreciative for the information.

## 2024-01-11 DIAGNOSIS — M170 Bilateral primary osteoarthritis of knee: Secondary | ICD-10-CM | POA: Diagnosis not present

## 2024-01-11 DIAGNOSIS — E7800 Pure hypercholesterolemia, unspecified: Secondary | ICD-10-CM | POA: Diagnosis not present

## 2024-01-11 DIAGNOSIS — N401 Enlarged prostate with lower urinary tract symptoms: Secondary | ICD-10-CM | POA: Diagnosis not present

## 2024-01-11 DIAGNOSIS — F3341 Major depressive disorder, recurrent, in partial remission: Secondary | ICD-10-CM | POA: Diagnosis not present

## 2024-01-17 DIAGNOSIS — M5431 Sciatica, right side: Secondary | ICD-10-CM | POA: Diagnosis not present

## 2024-01-17 DIAGNOSIS — M5432 Sciatica, left side: Secondary | ICD-10-CM | POA: Diagnosis not present

## 2024-01-17 DIAGNOSIS — M51372 Other intervertebral disc degeneration, lumbosacral region with discogenic back pain and lower extremity pain: Secondary | ICD-10-CM | POA: Diagnosis not present

## 2024-01-17 DIAGNOSIS — M51362 Other intervertebral disc degeneration, lumbar region with discogenic back pain and lower extremity pain: Secondary | ICD-10-CM | POA: Diagnosis not present

## 2024-01-17 DIAGNOSIS — Q72891 Other reduction defects of right lower limb: Secondary | ICD-10-CM | POA: Diagnosis not present

## 2024-01-17 DIAGNOSIS — M9904 Segmental and somatic dysfunction of sacral region: Secondary | ICD-10-CM | POA: Diagnosis not present

## 2024-01-17 DIAGNOSIS — M9905 Segmental and somatic dysfunction of pelvic region: Secondary | ICD-10-CM | POA: Diagnosis not present

## 2024-01-17 DIAGNOSIS — M5135 Other intervertebral disc degeneration, thoracolumbar region: Secondary | ICD-10-CM | POA: Diagnosis not present

## 2024-01-17 DIAGNOSIS — M9903 Segmental and somatic dysfunction of lumbar region: Secondary | ICD-10-CM | POA: Diagnosis not present

## 2024-01-18 DIAGNOSIS — M9905 Segmental and somatic dysfunction of pelvic region: Secondary | ICD-10-CM | POA: Diagnosis not present

## 2024-01-18 DIAGNOSIS — M9903 Segmental and somatic dysfunction of lumbar region: Secondary | ICD-10-CM | POA: Diagnosis not present

## 2024-01-18 DIAGNOSIS — M5432 Sciatica, left side: Secondary | ICD-10-CM | POA: Diagnosis not present

## 2024-01-18 DIAGNOSIS — M5135 Other intervertebral disc degeneration, thoracolumbar region: Secondary | ICD-10-CM | POA: Diagnosis not present

## 2024-01-18 DIAGNOSIS — M51362 Other intervertebral disc degeneration, lumbar region with discogenic back pain and lower extremity pain: Secondary | ICD-10-CM | POA: Diagnosis not present

## 2024-01-18 DIAGNOSIS — Q72891 Other reduction defects of right lower limb: Secondary | ICD-10-CM | POA: Diagnosis not present

## 2024-01-18 DIAGNOSIS — M9904 Segmental and somatic dysfunction of sacral region: Secondary | ICD-10-CM | POA: Diagnosis not present

## 2024-01-18 DIAGNOSIS — M5431 Sciatica, right side: Secondary | ICD-10-CM | POA: Diagnosis not present

## 2024-01-18 DIAGNOSIS — M51372 Other intervertebral disc degeneration, lumbosacral region with discogenic back pain and lower extremity pain: Secondary | ICD-10-CM | POA: Diagnosis not present

## 2024-01-18 NOTE — CV Procedure (Signed)
  Device system confirmed to be MRI conditional, with implant date > 6 weeks ago, and no evidence of abandoned or epicardial leads in review of most recent CXR  Device last cleared by EP Provider: Daphne Barrack 01/17/24  Clearance is good through for 1 year as long as parameters remain stable at time of check. If pt undergoes a cardiac device procedure during that time, they should be re-cleared.   Tachy-therapies to be programmed off if applicable with device back to pre-MRI settings after completion of exam.  [employer] - Programming recommendation received through [employer] App/Tablet  Emogene Bouchard, RT  01/18/2024 9:41 AM

## 2024-01-19 DIAGNOSIS — M51362 Other intervertebral disc degeneration, lumbar region with discogenic back pain and lower extremity pain: Secondary | ICD-10-CM | POA: Diagnosis not present

## 2024-01-19 DIAGNOSIS — M5135 Other intervertebral disc degeneration, thoracolumbar region: Secondary | ICD-10-CM | POA: Diagnosis not present

## 2024-01-19 DIAGNOSIS — M5432 Sciatica, left side: Secondary | ICD-10-CM | POA: Diagnosis not present

## 2024-01-19 DIAGNOSIS — M51372 Other intervertebral disc degeneration, lumbosacral region with discogenic back pain and lower extremity pain: Secondary | ICD-10-CM | POA: Diagnosis not present

## 2024-01-19 DIAGNOSIS — M9903 Segmental and somatic dysfunction of lumbar region: Secondary | ICD-10-CM | POA: Diagnosis not present

## 2024-01-19 DIAGNOSIS — M5431 Sciatica, right side: Secondary | ICD-10-CM | POA: Diagnosis not present

## 2024-01-19 DIAGNOSIS — M9904 Segmental and somatic dysfunction of sacral region: Secondary | ICD-10-CM | POA: Diagnosis not present

## 2024-01-19 DIAGNOSIS — Q72891 Other reduction defects of right lower limb: Secondary | ICD-10-CM | POA: Diagnosis not present

## 2024-01-19 DIAGNOSIS — M9905 Segmental and somatic dysfunction of pelvic region: Secondary | ICD-10-CM | POA: Diagnosis not present

## 2024-01-22 ENCOUNTER — Ambulatory Visit (HOSPITAL_COMMUNITY)
Admission: RE | Admit: 2024-01-22 | Discharge: 2024-01-22 | Disposition: A | Discharge status: Home / Self Care | Source: Ambulatory Visit | Attending: Neurology | Admitting: Neurology

## 2024-01-22 DIAGNOSIS — G309 Alzheimer's disease, unspecified: Secondary | ICD-10-CM | POA: Insufficient documentation

## 2024-01-22 DIAGNOSIS — R9082 White matter disease, unspecified: Secondary | ICD-10-CM | POA: Diagnosis not present

## 2024-01-22 DIAGNOSIS — R413 Other amnesia: Secondary | ICD-10-CM | POA: Diagnosis not present

## 2024-01-22 DIAGNOSIS — R9089 Other abnormal findings on diagnostic imaging of central nervous system: Secondary | ICD-10-CM | POA: Diagnosis not present

## 2024-01-22 MED ORDER — GADOBUTROL 1 MMOL/ML IV SOLN
7.0000 mL | Freq: Once | INTRAVENOUS | Status: AC | PRN
  Administered 2024-01-22 (×2): 7 mL via INTRAVENOUS

## 2024-02-01 ENCOUNTER — Encounter: Payer: Self-pay | Admitting: Neurology

## 2024-02-02 DIAGNOSIS — H04123 Dry eye syndrome of bilateral lacrimal glands: Secondary | ICD-10-CM | POA: Diagnosis not present

## 2024-02-02 DIAGNOSIS — H53143 Visual discomfort, bilateral: Secondary | ICD-10-CM | POA: Diagnosis not present

## 2024-02-08 DIAGNOSIS — M5416 Radiculopathy, lumbar region: Secondary | ICD-10-CM | POA: Diagnosis not present

## 2024-02-11 DIAGNOSIS — E7800 Pure hypercholesterolemia, unspecified: Secondary | ICD-10-CM | POA: Diagnosis not present

## 2024-02-11 DIAGNOSIS — N401 Enlarged prostate with lower urinary tract symptoms: Secondary | ICD-10-CM | POA: Diagnosis not present

## 2024-02-11 DIAGNOSIS — F3341 Major depressive disorder, recurrent, in partial remission: Secondary | ICD-10-CM | POA: Diagnosis not present

## 2024-02-11 DIAGNOSIS — M170 Bilateral primary osteoarthritis of knee: Secondary | ICD-10-CM | POA: Diagnosis not present

## 2024-02-23 ENCOUNTER — Other Ambulatory Visit: Payer: Self-pay | Admitting: Neurology

## 2024-03-12 DIAGNOSIS — N401 Enlarged prostate with lower urinary tract symptoms: Secondary | ICD-10-CM | POA: Diagnosis not present

## 2024-03-12 DIAGNOSIS — E7800 Pure hypercholesterolemia, unspecified: Secondary | ICD-10-CM | POA: Diagnosis not present

## 2024-03-12 DIAGNOSIS — F3341 Major depressive disorder, recurrent, in partial remission: Secondary | ICD-10-CM | POA: Diagnosis not present

## 2024-03-12 DIAGNOSIS — M170 Bilateral primary osteoarthritis of knee: Secondary | ICD-10-CM | POA: Diagnosis not present

## 2024-03-15 DIAGNOSIS — M5442 Lumbago with sciatica, left side: Secondary | ICD-10-CM | POA: Diagnosis not present

## 2024-03-15 DIAGNOSIS — M5441 Lumbago with sciatica, right side: Secondary | ICD-10-CM | POA: Diagnosis not present

## 2024-03-15 NOTE — Progress Notes (Signed)
 Remote PPM Transmission

## 2024-03-18 DIAGNOSIS — M5441 Lumbago with sciatica, right side: Secondary | ICD-10-CM | POA: Diagnosis not present

## 2024-03-18 DIAGNOSIS — M5442 Lumbago with sciatica, left side: Secondary | ICD-10-CM | POA: Diagnosis not present

## 2024-03-19 DIAGNOSIS — L578 Other skin changes due to chronic exposure to nonionizing radiation: Secondary | ICD-10-CM | POA: Diagnosis not present

## 2024-03-19 DIAGNOSIS — Z85828 Personal history of other malignant neoplasm of skin: Secondary | ICD-10-CM | POA: Diagnosis not present

## 2024-03-19 DIAGNOSIS — L814 Other melanin hyperpigmentation: Secondary | ICD-10-CM | POA: Diagnosis not present

## 2024-03-19 DIAGNOSIS — Z08 Encounter for follow-up examination after completed treatment for malignant neoplasm: Secondary | ICD-10-CM | POA: Diagnosis not present

## 2024-03-19 DIAGNOSIS — D2372 Other benign neoplasm of skin of left lower limb, including hip: Secondary | ICD-10-CM | POA: Diagnosis not present

## 2024-03-19 DIAGNOSIS — D1801 Hemangioma of skin and subcutaneous tissue: Secondary | ICD-10-CM | POA: Diagnosis not present

## 2024-03-19 DIAGNOSIS — I781 Nevus, non-neoplastic: Secondary | ICD-10-CM | POA: Diagnosis not present

## 2024-03-19 DIAGNOSIS — L821 Other seborrheic keratosis: Secondary | ICD-10-CM | POA: Diagnosis not present

## 2024-03-27 ENCOUNTER — Telehealth: Payer: Self-pay | Admitting: Neurology

## 2024-03-27 NOTE — Telephone Encounter (Signed)
 Pt's wife called stating that they received a letter stating that they are needing to schedule an appt after the infusion but the pt has not had an infusion. Also she stated that they have yet to received the MRI results. Please advise.

## 2024-03-27 NOTE — Telephone Encounter (Addendum)
 Dr. Rosemarie- looks like pt complete MRI Brain at [facility] 01/22/24 but I do not see any results. How do you want to proceed? This was needed in order to clear pt to start on Kisunla therapy.   I checked with Holly/[facility] here at [facility] and she replied:   Per Silvano, since it has been greater than 3 months since last visit, he will need updated visit. I verbally spoke with Dr. Rosemarie who agreed to fit pt in at 8am next available and he will review MRI results in the meantime.  Called wife back at [phone]. Scheduled appt for 03/28/24 at 8am with Dr. Rosemarie.   Pt last saw Dr. Rosemarie 12/21/23.   Notes from last OV:   Augustin, RN note:

## 2024-03-28 ENCOUNTER — Telehealth: Payer: Self-pay | Admitting: *Deleted

## 2024-03-28 ENCOUNTER — Encounter: Payer: Self-pay | Admitting: Neurology

## 2024-03-28 ENCOUNTER — Ambulatory Visit (INDEPENDENT_AMBULATORY_CARE_PROVIDER_SITE_OTHER): Admitting: Neurology

## 2024-03-28 VITALS — BP 126/64 | HR 79 | Ht 69.0 in | Wt 175.0 lb

## 2024-03-28 DIAGNOSIS — E7800 Pure hypercholesterolemia, unspecified: Secondary | ICD-10-CM | POA: Insufficient documentation

## 2024-03-28 DIAGNOSIS — F02A0 Dementia in other diseases classified elsewhere, mild, without behavioral disturbance, psychotic disturbance, mood disturbance, and anxiety: Secondary | ICD-10-CM

## 2024-03-28 DIAGNOSIS — N401 Enlarged prostate with lower urinary tract symptoms: Secondary | ICD-10-CM | POA: Insufficient documentation

## 2024-03-28 DIAGNOSIS — G301 Alzheimer's disease with late onset: Secondary | ICD-10-CM

## 2024-03-28 DIAGNOSIS — R413 Other amnesia: Secondary | ICD-10-CM

## 2024-03-28 DIAGNOSIS — F3340 Major depressive disorder, recurrent, in remission, unspecified: Secondary | ICD-10-CM | POA: Insufficient documentation

## 2024-03-28 DIAGNOSIS — F09 Unspecified mental disorder due to known physiological condition: Secondary | ICD-10-CM | POA: Insufficient documentation

## 2024-03-28 NOTE — Telephone Encounter (Signed)
 Gave completed/signed Kisunla orders to [facility] to start working on approval process before scheduling pt. Pt cleared to start therapy per Dr. Rosemarie.

## 2024-03-28 NOTE — Progress Notes (Signed)
 ran out of [facility] [address] [place]. [place] [address] [phone]       OFFICE FOLLOW UP VISIT NOTE  [name] Date of Birth:  16-Nov-1940 Medical Record Number:  [id number]   Referring MD: Elsie Lesches  Reason for Referral: Encephalopathy  YEP:Pwpupjo visit  04/16/2020 :George [name] is a 83 year old Caucasian male seen today for initial office consultation visit.  Is accompanied by his wife.  History is obtained from them and review of hospital referral notes and I personally reviewed available pertinent imaging films in PACS.  He has past medical history of sick sinus syndrome status post pacemaker and treated basal cell skin cancer and arthritis.  He was admitted to [facility] on 03/29/2020 to 04/04/2020.  He was found by his wife to be sitting and not responding on the floor with 2 empty bottles of cough syrup next to him.  He was found in the hospital to be quite confused and disoriented.  Initial impression was that he had Wernicke's encephalopathy and was a heavy drinker but the patient and wife both deny this.  Admission labs were pretty much unremarkable.  Subsequently underwent a CT scan of the head which showed no acute abnormality and MRI scan also showed no acute stroke no significant abnormality though it is significantly motion degraded and suboptimal quality.  He also had an EEG which was apparently normal but I do not have the actual report.  The patient gradually improved but to date does not have full memory of the event.  He remembers bits and pieces of the hospitalization but does not remember EMS taking him to the hospital.  Denied any headache at that time double vision, focal extremity weakness, numbness vertigo or diplopia.  He has no known prior history of strokes, TIAs, seizures, significant head injury with loss of consciousness or any other neurological problems.  He states is done well but still has some short-term memory  difficulties particularly related to the episode. Update 09/12/2022 : Patient is referred back by Dr. Elsie Lesches for evaluation for memory difficulties which are worsening.  He is accompanied by his wife today.  History is obtained from them and I personally reviewed pertinent available imaging films in PACS and electronic medical records and referral notes..  Patient had this episode of encephalopathy in October 2021 and since then has had some mild memory and cognitive difficulties with her persisted.  Patient's wife seems to think that there may be slowly getting worse though the patient agrees.  He continues to have mild short-term memory difficulties and occasional trouble finding words so he can remember them later.  He started using the GPS while driving all the time and he gets lost.  He also not able to remember his medications and his wife has.  Reminded him daily.  He is otherwise independent in activities of daily living.  He denies any prior history of dementia in his family.  Patient admits to drinking 3 beers a day and knows he needs to cut back.  He has been taking Namenda  5 mg twice daily several years has never tried going up to the full dose of 10 mg twice daily.  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  He had surgery in the right eye which did not help but plans to have a second surgery on the same eyes.  On Mini-Mental status testing today scored 28/30 with 1 deficits in.  This is declined from 30/30 at last visit 2 years ago.  Patient has not been bearing in regular activities that are cognitively challenging has not been using any memory compensation strategies. Update 08/31/2023 : He returns for follow-up today after his last visit nearly a year ago.  Is accompanied by his wife.  He is tolerating increasing Namenda  10 mg twice daily but feels that his memory is slowly slipping.  He cannot remember names of familiar streets and has tough time  driving though he has never gotten lost.  He often has to second-guess himself as he cannot remember recent information and appointments.  He does get disoriented in the evenings and does sundown in the bed and at times has hallucinations as per his wife.  He did have multiple illnesses in the last 1 including COVID, bronchitis and sinus infection.  He does need some help with activities of daily living.  On Mini-Mental status exam today he scored 25/30 which is declined from last visit 28/30 and on functional requestioning he scored 12 suggesting some disability.  He does not have family history of Alzheimer's would like to be evaluated for the same. Update 12/21/2023 : He returns for follow-up after last visit 4 months ago.  He is accompanied by his wife and daughter.  Patient continues to have progressive decline in his memory and forgets name of family streets and is having increased time driving and has to use GPS almost constantly.  He does get disoriented and confused towards the evenings but denies any hallucinations, delusions, agitation or unsafe behavior.  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  He also had low beta 42/40  tau ratio and elevated B12 which is suggestive of high likelihood of Alzheimer's pathology in the brain.  PET amyloid scan on/4/25 was also positive suggesting high possibility of amyloid related pathology in the brain.  Patient has been on Namenda  10 mg twice daily which is tolerating well without side effects but continues to have cognitive as well as functional decline.  Family is interested in being considered for treatment with the new antiamyloid therapies for early Alzheimer's Update 03/28/2024 : He returns for follow-up after last visit 3 months ago.  He is accompanied by his wife.  Continues to have worsening cognitive and memory difficulties.  He is requiring more help  from his wife.  Remains on Namenda  which is tolerating well without side effects but has not helped a lot.  Patient needs a lot of help taking his medicines and doing his bills.  He does get frustrated at times and has anger movements but has never been violent.  He does not have any delusions or hallucinations.  He has low energy and sleeps a lot and does not do a whole lot around the house or go out.  He has not had any wandering behavior.  He is still driving a bit but has become more dependent on his GPS.  He has not gotten lost or had any accidents.  Patient and wife are interested in starting [place] and he had an MRI scan of the brain done on 01/22/2024 which I personally reviewed shows mild generalized atrophy and changes of small vessel disease.  There are no amyloid related imaging abnormalities. ROS:   14 system review of systems is positive for confusion, hallucinations, agitation, disorientation, decreased memory, hiccups, gastritis and all other  systems negative PMH:  Past Medical History:  Diagnosis Date   Arthritis    qwhere (07/08/2016)   Cancer (HCC)    basal cell skin cancer removed from nose  and pre cancerous hemicolectomy years ago    Depression    Sick sinus syndrome (HCC)    a. s/p MDT dual chamber PPM    Small bowel obstruction (HCC)     Social History:  Social History   Socioeconomic History   Marital status: Married    Spouse name: [name]   Number of children: Not on file   Years of education: Not on file   Highest education level: Not on file  Occupational History   Not on file  Tobacco Use   Smoking status: Former    Current packs/day: 0.00    Average packs/day: 2.0 packs/day for 20.0 years (40.0 ttl pk-yrs)    Types: Cigarettes    Start date: 1964    Quit date: 1984    Years since quitting: 41.8   Smokeless tobacco: Never  Vaping Use   Vaping status: Never Used  Substance and Sexual Activity   Alcohol use: Yes    Alcohol/week: 3.0 standard drinks  of alcohol    Types: 2 Cans of beer, 1 Standard drinks or equivalent per week   Drug use: No   Sexual activity: Not on file  Other Topics Concern   Not on file  Social History Narrative   Lives with wife    Right Handed   Drinks 2-3 cups caffeine daily   Social Drivers of Corporate investment banker Strain: Not on file  Food Insecurity: Not on file  Transportation Needs: Not on file  Physical Activity: Not on file  Stress: Not on file  Social Connections: Not on file  Intimate Partner Violence: Not on file    Medications:   Current Outpatient Medications on File Prior to Visit  Medication Sig Dispense Refill   ANDROGEL  PUMP 20.25 MG/ACT (1.62%) GEL Apply 1 application topically daily.      buPROPion  (WELLBUTRIN  XL) 150 MG 24 hr tablet Take by mouth.     celecoxib  (CELEBREX ) 200 MG capsule Take 200 mg by mouth every other day.  (Patient taking differently: Take 200 mg by mouth daily.)     citalopram  (CELEXA ) 20 MG tablet Take 1 tablet by mouth daily.     donepezil (ARICEPT) 5 MG tablet Take 5 mg by mouth daily.     finasteride  (PROSCAR ) 5 MG tablet Take 5 mg by mouth every evening.      memantine  (NAMENDA ) 10 MG tablet TAKE ONE TABLET BY MOUTH TWICE DAILY 180 tablet 0   Multiple Vitamin (MULTIVITAMIN) tablet Take 1 tablet by mouth daily.     pantoprazole  (PROTONIX ) 40 MG tablet Take 40 mg by mouth 2 (two) times daily.     sildenafil (VIAGRA) 50 MG tablet Take 50 mg by mouth as needed.     tadalafil (CIALIS) 5 MG tablet Take by mouth as needed.     tamsulosin  (FLOMAX ) 0.4 MG CAPS capsule Take 0.4 mg by mouth daily.     traMADol  (ULTRAM ) 50 MG tablet Take 1 tablet (50 mg total) by mouth every 6 (six) hours as needed. 15 tablet 0   cholecalciferol (VITAMIN D3) 25 MCG (1000 UNIT) tablet Take 1,000 Units by mouth daily.     Multiple Vitamin (MULTIVITAMIN WITH MINERALS) TABS tablet Take 1 tablet by mouth daily. (Patient not taking: Reported on 03/28/2024)  promethazine -dextromethorphan (PROMETHAZINE -DM) 6.25-15 MG/5ML syrup Take 5 mLs by mouth 4 (four) times daily as needed for cough. (Patient not taking: Reported on 03/28/2024)     UNABLE TO FIND 10 mg. Med Name: alertec     No current facility-administered medications on file prior to visit.    Allergies:   Allergies  Allergen Reactions   Codeine Other (See Comments)    incoherance   Doxycycline Other (See Comments)    Stomach upset    Physical Exam General: well developed, well nourished elderly Caucasian male, seated, in no evident distress Head: head normocephalic and atraumatic.   Neck: supple with no carotid or supraclavicular bruits Cardiovascular: regular rate and rhythm, no murmurs Musculoskeletal: no deformity Skin:  no rash/petichiae Vascular:  Normal pulses all extremities  Neurologic Exam Mental Status: Awake and fully alert. Oriented to place and time. Recent and remote memory intact. Attention span, concentration and fund of knowledge appropriate. Mood and affect appropriate.  Diminished recall 1/3.  Able to name 15 animals which can walk on 4 legs.  Clock drawing 3/4.  On Mini-Mental status exam he scored 28/30 ( last visit 24/30 ).  On functional activity questionnaire score 18 suggestive of mild functional disability impairment Cranial Nerves: Fundoscopic exam reveals sharp disc margins. Pupils equal, briskly reactive to light. Extraocular movements full without nystagmus. Visual fields full to confrontation. Hearing intact. Facial sensation intact. Face, tongue, palate moves normally and symmetrically.  Motor: Normal bulk and tone. Normal strength in all tested extremity muscles. Sensory.: intact to touch , pinprick , position and vibratory sensation.  Coordination: Rapid alternating movements normal in all extremities. Finger-to-nose and heel-to-shin performed accurately bilaterally. Gait and Station: Arises from chair without difficulty. Stance is normal. Gait  demonstrates normal stride length and balance . Able to heel, toe and tandem walk without difficulty.  Reflexes: 1+ and symmetric. Toes downgoing.        03/28/2024    7:55 AM 12/21/2023    1:28 PM 08/31/2023    2:23 PM 09/12/2022    9:03 AM 04/16/2020    8:59 AM  MMSE - Mini Mental State Exam  Orientation to time 4 4 4 5 5   Orientation to Place 5 5 4 5 5   Registration 3 3 3 3 3   Attention/ Calculation 5 3 3 5 5   Recall 2 1 2 2 3   Language- name 2 objects 2 2 2 2 2   Language- repeat 1 1 1 1 1   Language- follow 3 step command 3 3 3 3 3   Language- read & follow direction 1 1 1 1 1   Write a sentence 1 1 1 1 1   Copy design 1 0 1 0 1  Total score 28 24 25 28 30       ASSESSMENT: 83 year old Caucasian male with transient episode of acute confusional state and psychosis following drinking excess cough syrup of unclear etiology in October 2021 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     PLAN: I had a long discussion with the patient,  and his wife regarding his his progressive memory and cognitive worsening and discussed results of lab work and amyloid PET scan now suggesting high risk for Alzheimer's dementia.  He continues to have memory and cognitive decline despite increasing Namenda  to 10 mg twice daily.  Recommend starting Kisunla treatment after getting insurance  approval.  I have discussed possible small risk of intracerebral hemorrhage and cerebral edema with the patient and family and stressed on the need for close follow-up in the office and plans with treatment regimen and answered questions.  I have discussed the results of his repeat MRI scan of the brain prior to starting Kisunla. I encouraged him to increase participation in cognitively challenging activities like solving crossword puzzles, playing  bridge and sodoku.  We also discussed memory compensation strategies.  Patient wants to drive and family is concerned about his skills hence I will  recommend referral   to a licensed motor driving school for a very comprehensive testing about his driving ability.    I personally spent a total of 35 minutes in the care of the patient today including getting/reviewing separately obtained history, performing a medically appropriate exam/evaluation, counseling and educating, placing orders, referring and communicating with other health care professionals, documenting clinical information in the EHR, independently interpreting results, and coordinating care.        Eather Popp, MD Note: This document was prepared with digital dictation and possible smart phrase technology. Any transcriptional errors that result from this process are unintentional.

## 2024-03-28 NOTE — Telephone Encounter (Signed)
 Dr. Phillips update below.  Called [facility]-reading room at [phone] and spoke w/ [place]. MRI was tagged as no read. Unsure why.  Confirmed images there for radiologist to review. Put back in reading que to be read. Marked urgent/STAT. Made him aware pt has appt today at 8am with Dr. Rosemarie and to expedite if possible.

## 2024-03-28 NOTE — Patient Instructions (Signed)
 I had a long discussion with the patient,  and his wife regarding his his progressive memory and cognitive worsening and discussed results of lab work and amyloid PET scan now suggesting high risk for Alzheimer's dementia.  He continues to have memory and cognitive decline despite increasing Namenda  to 10 mg twice daily.  Recommend starting Kisunla treatment after getting insurance approval.  I have discussed possible small risk of intracerebral hemorrhage and cerebral edema with the patient and family and stressed on the need for close follow-up in the office and plans with treatment regimen and answered questions.  I have discussed the results of his repeat MRI scan of the brain prior to starting Kisunla. I encouraged him to increase participation in cognitively challenging activities like solving crossword puzzles, playing bridge and sodoku.  We also discussed memory compensation strategies.  Patient wants to drive and family is concerned about his skills hence I will  recommend referral   to a licensed motor driving school for a very comprehensive testing about his driving ability.    KISUNLA (Donanemab Injection) What is this medication? DONANEMAB (doe NAN e mab) treats Alzheimer disease. It works by decreasing the buildup of amyloid, a protein that may cause Alzheimer disease. This may slow down the worsening of symptoms. It is a monoclonal antibody. This medicine may be used for other purposes; ask your health care provider or pharmacist if you have questions. COMMON BRAND NAME(S): KISUNLA What should I tell my care team before I take this medication? They need to know if you have any of these conditions: Taking a blood thinner An unusual or allergic reaction to donanemab, other medications, foods, dyes, or preservatives Pregnant or trying to get pregnant Breastfeeding How should I use this medication? This medication is infused into a vein. It is given by your care team in a hospital or clinic  setting. A special MedGuide will be given to you before each treatment. Be sure to read this information carefully each time. Talk to your care team about the use of this medication in children. Special care may be needed. Overdosage: If you think you have taken too much of this medicine contact a poison control center or emergency room at once. NOTE: This medicine is only for you. Do not share this medicine with others. What if I miss a dose? Keep appointments for follow-up doses. It is important not to miss your dose. Call your care team if you are unable to keep an appointment. What may interact with this medication? Interactions have not been studied. This list may not describe all possible interactions. Give your health care provider a list of all the medicines, herbs, non-prescription drugs, or dietary supplements you use. Also tell them if you smoke, drink alcohol, or use illegal drugs. Some items may interact with your medicine. What should I watch for while using this medication? Visit your care team for regular checks on your progress. Tell your care team if your symptoms do not start to get better or if they get worse. This medication can cause a serious side effect called amyloid related imaging abnormalities (ARIA). ARIA can cause swelling or bleeding in the brain. Some people have a genetic risk factor that increases the risk of ARIA. Your care team may test you for this risk factor. The risk of bleeding in the brain is increased in people who take blood thinners. Talk to your care team if you take medications to prevent or treat blood clots. You will have imaging  scans before and during your treatment to help your care team monitor for ARIA. Contact your care team right away if you have a severe headache, worsening confusion, dizziness, change in vision, nausea, trouble walking, or seizures. What side effects may I notice from receiving this medication? Side effects that you should  report to your care team as soon as possible: Allergic reactions or angioedema--skin rash, itching or hives, swelling of the face, eyes, lips, tongue, arms, or legs, trouble swallowing or breathing Headache, worsening confusion, dizziness, change in vision, nausea, seizures Infusion reactions--chest pain, shortness of breath or trouble breathing, feeling faint or lightheaded Side effects that usually do not require medical attention (report these to your care team if they continue or are bothersome): Headache This list may not describe all possible side effects. Call your doctor for medical advice about side effects. You may report side effects to FDA at 1-800-FDA-1088. Where should I keep my medication? This medication is given in a hospital or clinic. It will not be stored at home. NOTE: This sheet is a summary. It may not cover all possible information. If you have questions about this medicine, talk to your doctor, pharmacist, or health care provider.  2024 Elsevier/Gold Standard (2023-05-12 00:00:00)

## 2024-04-02 ENCOUNTER — Ambulatory Visit: Payer: Medicare Other | Attending: Internal Medicine

## 2024-04-02 DIAGNOSIS — M5442 Lumbago with sciatica, left side: Secondary | ICD-10-CM | POA: Diagnosis not present

## 2024-04-02 DIAGNOSIS — I495 Sick sinus syndrome: Secondary | ICD-10-CM | POA: Diagnosis not present

## 2024-04-02 DIAGNOSIS — M5441 Lumbago with sciatica, right side: Secondary | ICD-10-CM | POA: Diagnosis not present

## 2024-04-04 LAB — CUP PACEART REMOTE DEVICE CHECK
Battery Remaining Longevity: 106 mo
Battery Voltage: 3 V
Brady Statistic AP VP Percent: 0.01 %
Brady Statistic AP VS Percent: 0.3 %
Brady Statistic AS VP Percent: 0.04 %
Brady Statistic AS VS Percent: 99.64 %
Brady Statistic RA Percent Paced: 0.3 %
Brady Statistic RV Percent Paced: 0.05 %
Date Time Interrogation Session: 20251021024000
Implantable Lead Connection Status: 753985
Implantable Lead Connection Status: 753985
Implantable Lead Implant Date: 20180126
Implantable Lead Implant Date: 20180126
Implantable Lead Location: 753859
Implantable Lead Location: 753860
Implantable Lead Model: 5076
Implantable Lead Model: 5076
Implantable Pulse Generator Implant Date: 20180126
Lead Channel Impedance Value: 304 Ohm
Lead Channel Impedance Value: 323 Ohm
Lead Channel Impedance Value: 399 Ohm
Lead Channel Impedance Value: 418 Ohm
Lead Channel Pacing Threshold Amplitude: 0.75 V
Lead Channel Pacing Threshold Amplitude: 0.875 V
Lead Channel Pacing Threshold Pulse Width: 0.4 ms
Lead Channel Pacing Threshold Pulse Width: 0.4 ms
Lead Channel Sensing Intrinsic Amplitude: 2.25 mV
Lead Channel Sensing Intrinsic Amplitude: 2.25 mV
Lead Channel Sensing Intrinsic Amplitude: 4.125 mV
Lead Channel Sensing Intrinsic Amplitude: 4.125 mV
Lead Channel Setting Pacing Amplitude: 2 V
Lead Channel Setting Pacing Amplitude: 2.5 V
Lead Channel Setting Pacing Pulse Width: 0.4 ms
Lead Channel Setting Sensing Sensitivity: 2 mV
Zone Setting Status: 755011
Zone Setting Status: 755011

## 2024-04-05 NOTE — Progress Notes (Signed)
 Remote PPM Transmission

## 2024-04-09 DIAGNOSIS — M5441 Lumbago with sciatica, right side: Secondary | ICD-10-CM | POA: Diagnosis not present

## 2024-04-09 DIAGNOSIS — M5442 Lumbago with sciatica, left side: Secondary | ICD-10-CM | POA: Diagnosis not present

## 2024-04-12 DIAGNOSIS — M5441 Lumbago with sciatica, right side: Secondary | ICD-10-CM | POA: Diagnosis not present

## 2024-04-12 DIAGNOSIS — M5442 Lumbago with sciatica, left side: Secondary | ICD-10-CM | POA: Diagnosis not present

## 2024-04-12 DIAGNOSIS — E7800 Pure hypercholesterolemia, unspecified: Secondary | ICD-10-CM | POA: Diagnosis not present

## 2024-04-12 DIAGNOSIS — F3341 Major depressive disorder, recurrent, in partial remission: Secondary | ICD-10-CM | POA: Diagnosis not present

## 2024-04-12 DIAGNOSIS — M170 Bilateral primary osteoarthritis of knee: Secondary | ICD-10-CM | POA: Diagnosis not present

## 2024-04-12 DIAGNOSIS — N401 Enlarged prostate with lower urinary tract symptoms: Secondary | ICD-10-CM | POA: Diagnosis not present

## 2024-04-16 DIAGNOSIS — M5441 Lumbago with sciatica, right side: Secondary | ICD-10-CM | POA: Diagnosis not present

## 2024-04-16 DIAGNOSIS — M5442 Lumbago with sciatica, left side: Secondary | ICD-10-CM | POA: Diagnosis not present

## 2024-04-17 ENCOUNTER — Ambulatory Visit: Payer: Self-pay | Admitting: Cardiovascular Disease

## 2024-04-17 NOTE — Telephone Encounter (Signed)
 Received this message from [facility] w/ [facility]:  George [name] [birth year] I have tried to call patient 2x to schedule first infusion. Patient is not responding .. I have LVM both times

## 2024-04-21 DIAGNOSIS — J069 Acute upper respiratory infection, unspecified: Secondary | ICD-10-CM | POA: Diagnosis not present

## 2024-04-21 DIAGNOSIS — Z03818 Encounter for observation for suspected exposure to other biological agents ruled out: Secondary | ICD-10-CM | POA: Diagnosis not present

## 2024-04-21 DIAGNOSIS — J22 Unspecified acute lower respiratory infection: Secondary | ICD-10-CM | POA: Diagnosis not present

## 2024-04-21 DIAGNOSIS — R059 Cough, unspecified: Secondary | ICD-10-CM | POA: Diagnosis not present

## 2024-04-21 DIAGNOSIS — I700 Atherosclerosis of aorta: Secondary | ICD-10-CM | POA: Diagnosis not present

## 2024-04-23 DIAGNOSIS — E7800 Pure hypercholesterolemia, unspecified: Secondary | ICD-10-CM | POA: Diagnosis not present

## 2024-04-23 DIAGNOSIS — I10 Essential (primary) hypertension: Secondary | ICD-10-CM | POA: Diagnosis not present

## 2024-04-23 DIAGNOSIS — G301 Alzheimer's disease with late onset: Secondary | ICD-10-CM | POA: Diagnosis not present

## 2024-04-23 DIAGNOSIS — N3289 Other specified disorders of bladder: Secondary | ICD-10-CM | POA: Diagnosis not present

## 2024-04-23 DIAGNOSIS — R339 Retention of urine, unspecified: Secondary | ICD-10-CM | POA: Diagnosis not present

## 2024-04-23 DIAGNOSIS — F0280 Dementia in other diseases classified elsewhere without behavioral disturbance: Secondary | ICD-10-CM | POA: Diagnosis not present

## 2024-04-23 DIAGNOSIS — N401 Enlarged prostate with lower urinary tract symptoms: Secondary | ICD-10-CM | POA: Diagnosis not present

## 2024-04-23 DIAGNOSIS — R911 Solitary pulmonary nodule: Secondary | ICD-10-CM | POA: Diagnosis not present

## 2024-04-23 DIAGNOSIS — K5732 Diverticulitis of large intestine without perforation or abscess without bleeding: Secondary | ICD-10-CM | POA: Diagnosis not present

## 2024-04-23 DIAGNOSIS — R319 Hematuria, unspecified: Secondary | ICD-10-CM | POA: Diagnosis not present

## 2024-04-23 DIAGNOSIS — K5792 Diverticulitis of intestine, part unspecified, without perforation or abscess without bleeding: Secondary | ICD-10-CM | POA: Diagnosis not present

## 2024-04-23 DIAGNOSIS — R338 Other retention of urine: Secondary | ICD-10-CM | POA: Diagnosis not present

## 2024-04-24 DIAGNOSIS — J22 Unspecified acute lower respiratory infection: Secondary | ICD-10-CM | POA: Diagnosis not present

## 2024-04-27 DIAGNOSIS — R5381 Other malaise: Secondary | ICD-10-CM | POA: Diagnosis not present

## 2024-04-27 DIAGNOSIS — J40 Bronchitis, not specified as acute or chronic: Secondary | ICD-10-CM | POA: Diagnosis not present

## 2024-04-27 DIAGNOSIS — R051 Acute cough: Secondary | ICD-10-CM | POA: Diagnosis not present

## 2024-04-27 DIAGNOSIS — R5383 Other fatigue: Secondary | ICD-10-CM | POA: Diagnosis not present

## 2024-04-30 ENCOUNTER — Other Ambulatory Visit: Payer: Self-pay | Admitting: Neurology

## 2024-04-30 ENCOUNTER — Telehealth: Payer: Self-pay | Admitting: *Deleted

## 2024-04-30 DIAGNOSIS — F0280 Dementia in other diseases classified elsewhere without behavioral disturbance: Secondary | ICD-10-CM | POA: Diagnosis not present

## 2024-04-30 DIAGNOSIS — Z006 Encounter for examination for normal comparison and control in clinical research program: Secondary | ICD-10-CM | POA: Diagnosis not present

## 2024-04-30 DIAGNOSIS — G301 Alzheimer's disease with late onset: Secondary | ICD-10-CM

## 2024-04-30 DIAGNOSIS — G309 Alzheimer's disease, unspecified: Secondary | ICD-10-CM | POA: Diagnosis not present

## 2024-04-30 DIAGNOSIS — Z79899 Other long term (current) drug therapy: Secondary | ICD-10-CM

## 2024-04-30 NOTE — Telephone Encounter (Signed)
 Dr. Vear- you are work in this AM. Dr. Rosemarie not here. Can you place MRI order? Pt on Kisunla

## 2024-05-12 DIAGNOSIS — M170 Bilateral primary osteoarthritis of knee: Secondary | ICD-10-CM | POA: Diagnosis not present

## 2024-05-12 DIAGNOSIS — N401 Enlarged prostate with lower urinary tract symptoms: Secondary | ICD-10-CM | POA: Diagnosis not present

## 2024-05-12 DIAGNOSIS — F3341 Major depressive disorder, recurrent, in partial remission: Secondary | ICD-10-CM | POA: Diagnosis not present

## 2024-05-12 DIAGNOSIS — E7800 Pure hypercholesterolemia, unspecified: Secondary | ICD-10-CM | POA: Diagnosis not present

## 2024-05-20 ENCOUNTER — Encounter: Payer: Self-pay | Admitting: Pulmonary Disease

## 2024-05-20 ENCOUNTER — Ambulatory Visit: Admitting: Pulmonary Disease

## 2024-05-20 VITALS — BP 163/82 | HR 72 | Ht 68.0 in | Wt 166.0 lb

## 2024-05-20 DIAGNOSIS — R911 Solitary pulmonary nodule: Secondary | ICD-10-CM

## 2024-05-20 NOTE — Patient Instructions (Addendum)
 We will schedule you for a CT Chest scan in 1-2 weeks to monitor that lung nodule.  I will message or call you with the results  Follow up in 1 year, call sooner if needed

## 2024-05-20 NOTE — Progress Notes (Signed)
 New Patient Pulmonology Office Visit   Subjective:  Patient ID: George [name], male    DOB: May 10, 1941  MRN: [id number]  Referred by: Arloa Elsie SAUNDERS, MD  CC:  Chief Complaint  Patient presents with   Consult    Pt states unsure why he is here [facility] [place] something     Discussed the use of AI scribe software for clinical note transcription with the patient, who gave verbal consent to proceed.  History of Present Illness George [name] is an 83 year old male, former smoker who presents for evaluation of a lung nodule.   CT Stone Scan 04/23/2024 showed 7mm RLL nodule previously noted on scan in 2021 and is reported to be slightly larger since that time. A chest X-ray performed in late November at a walk-in clinic confirmed the nodule, with reports suggesting slight growth . He has no shortness of breath, cough, or wheezing and no activity limitation from respiratory symptoms.  He smoked about half a pack per day for roughly 10 years and quit 20-25 years ago. He had heavy secondhand smoke exposure in childhood. He denies occupational exposure to dusts or chemicals and worked as a medical illustrator.  He has dementia with associated weight loss during a recent illness in August-September 2025 due to prolonged sleep and missed meals, though his appetite is good when awake.  Family history is negative for known lung disease.  He had skin cancer on his nose, type uncertain, possibly melanoma or squamous cell carcinoma.        Review of Systems  Constitutional:  Negative for chills, fever, malaise/fatigue and weight loss.  HENT:  Negative for congestion, sinus pain and sore throat.   Eyes: Negative.   Respiratory:  Negative for cough, hemoptysis, sputum production, shortness of breath and wheezing.   Cardiovascular:  Negative for chest pain, palpitations, orthopnea, claudication and leg swelling.  Gastrointestinal:  Negative for abdominal pain, heartburn, nausea  and vomiting.  Genitourinary: Negative.   Musculoskeletal:  Negative for joint pain and myalgias.  Skin:  Negative for rash.  Neurological:  Negative for weakness.  Endo/Heme/Allergies: Negative.   Psychiatric/Behavioral: Negative.      Allergies: Codeine and Doxycycline  Current Outpatient Medications:    ANDROGEL  PUMP 20.25 MG/ACT (1.62%) GEL, Apply 1 application topically daily. , Disp: , Rfl:    buPROPion  (WELLBUTRIN  XL) 150 MG 24 hr tablet, Take by mouth., Disp: , Rfl:    celecoxib  (CELEBREX ) 200 MG capsule, Take 200 mg by mouth every other day.  (Patient taking differently: Take 200 mg by mouth daily.), Disp: , Rfl:    cholecalciferol (VITAMIN D3) 25 MCG (1000 UNIT) tablet, Take 1,000 Units by mouth daily., Disp: , Rfl:    citalopram  (CELEXA ) 20 MG tablet, Take 1 tablet by mouth daily., Disp: , Rfl:    Donanemab-azbt (KISUNLA IV), Inject 1 Dose into the vein See admin instructions. Infusion 1: 350mg  IV every 4 wk x1dose, infusion 2: 700mg  IV every 4 wk x1dose, infusion 3: 1050mg  IV every 4 wk x1dose, infusion 4: 1400mg  IV every 4 weeks. Maintenance dose: 1400mg  IV every 4 wk thereafter, Disp: , Rfl:    donepezil (ARICEPT) 5 MG tablet, Take 5 mg by mouth daily., Disp: , Rfl:    finasteride  (PROSCAR ) 5 MG tablet, Take 5 mg by mouth every evening. , Disp: , Rfl:    memantine  (NAMENDA ) 10 MG tablet, TAKE ONE TABLET BY MOUTH TWICE DAILY, Disp: 180 tablet, Rfl: 0  Multiple Vitamin (MULTIVITAMIN WITH MINERALS) TABS tablet, Take 1 tablet by mouth daily., Disp: , Rfl:    Multiple Vitamin (MULTIVITAMIN) tablet, Take 1 tablet by mouth daily., Disp: , Rfl:    pantoprazole  (PROTONIX ) 40 MG tablet, Take 40 mg by mouth 2 (two) times daily., Disp: , Rfl:    promethazine -dextromethorphan (PROMETHAZINE -DM) 6.25-15 MG/5ML syrup, Take 5 mLs by mouth 4 (four) times daily as needed for cough., Disp: , Rfl:    sildenafil (VIAGRA) 50 MG tablet, Take 50 mg by mouth as needed., Disp: , Rfl:    tadalafil  (CIALIS) 5 MG tablet, Take by mouth as needed., Disp: , Rfl:    tamsulosin  (FLOMAX ) 0.4 MG CAPS capsule, Take 0.4 mg by mouth daily., Disp: , Rfl:    traMADol  (ULTRAM ) 50 MG tablet, Take 1 tablet (50 mg total) by mouth every 6 (six) hours as needed., Disp: 15 tablet, Rfl: 0   UNABLE TO FIND, 10 mg. 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 s/p MDT dual chamber PPM    Small bowel obstruction (HCC)    Past Surgical History:  Procedure Laterality Date   APPENDECTOMY  09/2003   BASAL CELL CARCINOMA EXCISION     nose   COLECTOMY Right 09/2003   thelbert 10/27/2010   COLON SURGERY     EP IMPLANTABLE DEVICE N/A 04/05/2016   Procedure: Loop Recorder Insertion;  Surgeon: Lynwood Rakers, MD;  Location: [facility];  Service: Cardiovascular;  Laterality: N/A;   EP IMPLANTABLE DEVICE N/A 07/08/2016   MDT Azure XT DR implanted by Dr Inocencio for sick sinus syndrome/ sinus pauses   EP IMPLANTABLE DEVICE N/A 07/08/2016   Procedure: Loop Recorder Removal;  Surgeon: Will Gladis Inocencio, MD;  Location: [facility];  Service: Cardiovascular;  Laterality: N/A;   TONSILLECTOMY     TOTAL KNEE ARTHROPLASTY Right 03/03/2014   Procedure: RIGHT TOTAL KNEE ARTHROPLASTY;  Surgeon: Dempsey Melodi GAILS, MD;  Location: [facility];  Service: Orthopedics;  Laterality: Right;   TOTAL KNEE ARTHROPLASTY Left 01/2005   thelbert 10/27/2010   Family History  Problem Relation Age of Onset   Cancer Father    Diabetes Mellitus II Mother    Social History   Socioeconomic History   Marital status: Married    Spouse name: [name]   Number of children: Not on file   Years of education: Not on file   Highest education level: Not on file  Occupational History   Not on file  Tobacco Use   Smoking status:  Former    Current packs/day: 0.00    Average packs/day: 2.0 packs/day for 20.0 years (40.0 ttl pk-yrs)    Types: Cigarettes    Start date: 1964    Quit date: 1984    Years since quitting: 41.9   Smokeless tobacco: Never  Vaping Use   Vaping status: Never Used  Substance and Sexual Activity   Alcohol use: Yes    Alcohol/week: 3.0 standard drinks of alcohol    Types: 2 Cans of beer, 1 Standard drinks or equivalent per week   Drug use: No   Sexual activity: Not on file  Other Topics Concern   Not on file  Social History  Narrative   Lives with wife    Right Handed   Drinks 2-3 cups caffeine daily   Social Drivers of Corporate Investment Banker Strain: Not on file  Food Insecurity: Not on file  Transportation Needs: Not on file  Physical Activity: Not on file  Stress: Not on file  Social Connections: Not on file  Intimate Partner Violence: Not on file       Objective:  BP (!) 163/82   Pulse 72   Ht 5' 8 (1.727 m)   Wt 166 lb (75.3 kg)   SpO2 96%   BMI 25.24 kg/m    Physical Exam Constitutional:      General: He is not in acute distress.    Appearance: Normal appearance.  Eyes:     General: No scleral icterus.    Conjunctiva/sclera: Conjunctivae normal.  Cardiovascular:     Rate and Rhythm: Normal rate and regular rhythm.  Pulmonary:     Breath sounds: No wheezing, rhonchi or rales.  Musculoskeletal:     Right lower leg: No edema.     Left lower leg: No edema.  Skin:    General: Skin is warm and dry.  Neurological:     General: No focal deficit present.     Diagnostic Review:  Last CBC Lab Results  Component Value Date   WBC 8.7 03/27/2020   HGB 15.3 03/27/2020   HCT 44.1 03/27/2020   MCV 93.4 03/27/2020   MCH 32.4 03/27/2020   RDW 11.9 03/27/2020   PLT 191 03/27/2020   Last metabolic panel Lab Results  Component Value Date   GLUCOSE 117 (H) 03/27/2020   NA 138 03/27/2020   K 3.8 03/27/2020   CL 100 03/27/2020   CO2 29 03/27/2020   BUN  16 03/27/2020   CREATININE 1.04 03/27/2020   GFRNONAA >60 03/27/2020   CALCIUM 8.9 03/27/2020   PHOS 3.9 10/04/2015   PROT 6.9 10/04/2015   ALBUMIN 3.9 10/04/2015   BILITOT 0.8 10/04/2015   ALKPHOS 72 10/04/2015   AST 27 10/04/2015   ALT 18 10/04/2015   ANIONGAP 9 03/27/2020    CT Stone Scan 04/23/24 1.  Acute uncomplicated sigmoid diverticulitis.  2.  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  3.  Right lower lobe pulmonary nodule, favored benign given minimal increased since 2021, though the patient is at high risk of malignancy, recommend follow-up CT chest in 12 months.     Assessment & Plan:   Assessment & Plan Lung nodule seen on imaging study  Orders:   CT Chest Wo Contrast; Future   Assessment and Plan Assessment & Plan Right lung pulmonary nodule Right lung nodule with slow growth since 2020-2021. Concern for malignancy due to growth pattern, but differential includes benign etiologies. - Ordered dedicated chest CT to evaluate nodule characteristics and size. - Consider biopsy or further testing if nodule shows concerning features. - [facility]. - Will message or call patient with results      Return in about 1 year (around 05/20/2025) for f/u visit Dr. Kara.   Dorn KATHEE Kara, MD

## 2024-05-25 ENCOUNTER — Other Ambulatory Visit: Payer: Self-pay | Admitting: Neurology

## 2024-05-27 ENCOUNTER — Ambulatory Visit (HOSPITAL_BASED_OUTPATIENT_CLINIC_OR_DEPARTMENT_OTHER): Admission: RE | Admit: 2024-05-27 | Discharge: 2024-05-27 | Attending: Pulmonary Disease

## 2024-05-27 DIAGNOSIS — R911 Solitary pulmonary nodule: Secondary | ICD-10-CM | POA: Insufficient documentation

## 2024-05-29 NOTE — Telephone Encounter (Signed)
 Last seen on 03/28/24 Follow up scheduled on 06/19/24

## 2024-05-31 NOTE — Progress Notes (Signed)
" °  Device system confirmed to be MRI conditional, with implant date > 6 weeks ago, and no evidence of abandoned or epicardial leads in review of most recent CXR  Device last cleared by EP Provider: Daphne Barrack 05/30/24  Clearance is good through for 1 year as long as parameters remain stable at time of check. If pt undergoes a cardiac device procedure during that time, they should be re-cleared.   Tachy-therapies to be programmed off if applicable with device back to pre-MRI settings after completion of exam.  [employer] - Programming recommendation received through [employer] App/Tablet  Rocky Catalan, RT  05/31/2024 7:49 AM     "

## 2024-06-04 ENCOUNTER — Ambulatory Visit (HOSPITAL_COMMUNITY)
Admission: RE | Admit: 2024-06-04 | Discharge: 2024-06-04 | Disposition: A | Discharge status: Home / Self Care | Source: Ambulatory Visit | Attending: Neurology | Admitting: Neurology

## 2024-06-04 ENCOUNTER — Telehealth: Payer: Self-pay | Admitting: *Deleted

## 2024-06-04 DIAGNOSIS — Z79899 Other long term (current) drug therapy: Secondary | ICD-10-CM | POA: Insufficient documentation

## 2024-06-04 DIAGNOSIS — G301 Alzheimer's disease with late onset: Secondary | ICD-10-CM | POA: Diagnosis present

## 2024-06-04 DIAGNOSIS — F0280 Dementia in other diseases classified elsewhere without behavioral disturbance: Secondary | ICD-10-CM | POA: Insufficient documentation

## 2024-06-04 NOTE — Telephone Encounter (Signed)
 George [name] DOB [birth year] had his Kisunla infusion today 12/23. He will need a MRI completed  before his next infusion on  07/04/24.

## 2024-06-04 NOTE — Progress Notes (Signed)
 Patient was monitored by this RN during MRI scan due to presence of a pacemaker. Cardiac rhythm was continuously monitored throughout the procedure. Prior to the start of the scan, the pacemaker was placed in MRI-safe mode by the MRI technician and/or pacemaker representative. Following the completion of the scan, the device was returned to its pre-MRI settings. Neurological status and orientation post-procedure were unchanged from baseline.   Pre-procedure Heart Rate (Prior to being placed in MRI safe mode): 68 Post-procedure Heart Rate (Once pacemaker is returned to baseline mode): 68

## 2024-06-05 NOTE — Telephone Encounter (Signed)
 I called [phone], she will expediate (make stat read) for pt for possible infusion on Monday intrafusion).

## 2024-06-05 NOTE — Telephone Encounter (Addendum)
 Pt had his MRI done on 06-04-2024.  Will see if Dr. Alene can read to  can get pt in on Monday for infusion.

## 2024-06-07 ENCOUNTER — Ambulatory Visit: Payer: Self-pay | Admitting: Neurology

## 2024-06-10 ENCOUNTER — Telehealth: Payer: Self-pay | Admitting: *Deleted

## 2024-06-10 ENCOUNTER — Other Ambulatory Visit: Payer: Self-pay | Admitting: Neurology

## 2024-06-10 DIAGNOSIS — F0280 Dementia in other diseases classified elsewhere without behavioral disturbance: Secondary | ICD-10-CM

## 2024-06-10 DIAGNOSIS — R9089 Other abnormal findings on diagnostic imaging of central nervous system: Secondary | ICD-10-CM

## 2024-06-10 NOTE — Telephone Encounter (Signed)
 I gave the information to Kim in  [facility], for them to schedule pt.

## 2024-06-10 NOTE — Telephone Encounter (Signed)
 Sater, Charlie LABOR, MD to Johnie LITTIE Speed     06/07/24  4:51 PM Sethi patient.  On Alzheimer treatment --- MRI is unchanged.  Ok to continue the infusion  This MyChart message has not been read. MR BRAIN WO CONTRAST

## 2024-06-10 NOTE — Telephone Encounter (Signed)
 Pt in for his 2nd Celedonio today, so he will need an MRI done and read before his next appt 07/08/24.

## 2024-06-13 ENCOUNTER — Ambulatory Visit: Payer: Self-pay | Admitting: Pulmonary Disease

## 2024-06-13 DIAGNOSIS — R911 Solitary pulmonary nodule: Secondary | ICD-10-CM

## 2024-06-14 NOTE — Telephone Encounter (Signed)
 Spoke w/ PT wife VBU,  okay per DPR - due to pt having dementia

## 2024-06-18 NOTE — Progress Notes (Unsigned)
 " ran out of [facility] [address] [place]. [place] [address] [phone]       OFFICE FOLLOW UP VISIT NOTE  [name] Date of Birth:  1940-11-12 Medical Record Number:  [id number]    Primary neurologist: Dr. Rosemarie Reason for visit: Alzheimer's disease   Follow-up visit:  Prior visit: 03/28/2024  Brief HPI:  George [name] is a 84 y.o. male who is followed for Alzheimer's dementia.  He was initially evaluated by Dr. Rosemarie in 04/2020 for hospital follow-up after episode of acute confusional state and psychosis following drinking excessive cough syrup of unclear etiology in 03/2020 possibly toxic metabolic encephalopathy followed by mild persistent cognitive impairment.  He returned back to office in 09/2022 after being lost to follow-up due to worsening memory difficulties with complaints of getting lost while driving, difficulty keeping up with appointments and medications and occasional word finding difficulty, also noted possible sundowning behaviors.  Repeat EEG and MRI brain without concerning findings.  Further workup completed 08/2023 including biomarkers and PET scan which were suggestive of Alzheimer's dementia.  At prior visit with Dr. Rosemarie, noted progressive memory and cognitive worsening despite consistent use of memantine  at therapeutic dose.  Decided to initiate Kisunla after prolonged discussion regarding potential risks.    Interval history:  Patient returns for follow-up visit accompanied by his wife and daughter.  Reports overall memory has been stable since prior visit and denies any decline.  He believes George [name] has been beneficial. Kisunla initiated 04/2024, 2nd infusion 12/29 and scheduled for 3rd infusion 1/26. MR brain 05/2024 without evidence of ARIA.  He is scheduled for repeat imaging 1/20, prior to 3rd infusion. MMSE today 21/30, prior 28/30 although prior to that, 24/30. FAQ 12, previously 18.   Wife also mentions  onset of right eyelid droop that started between end of November and mid December. She was seen by ophthalmology who has referred him to plastic surgery to discuss blepharoplasty.  He was also referred to Dr. Gladis neuro-ophthalmologist for further evaluation. Does not notice weakness upon awakening but occurs shortly after. He can have occasional double vision but denies persistent symptoms, per family this is not new. Denies worsening weakness with fatigue.  Denies limb or bulbar weakness.       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 s/p MDT dual chamber PPM    Small bowel obstruction (HCC)     Social History:  Social History   Socioeconomic History   Marital status: Married    Spouse name: George [name]   Number of children: Not on file   Years of education: Not on file   Highest education level: Not on file  Occupational History   Not on file  Tobacco Use   Smoking status: Former    Current packs/day: 0.00    Average packs/day: 2.0 packs/day for 20.0 years (40.0 ttl pk-yrs)    Types: Cigarettes    Start date: 1964    Quit date: 1984    Years since quitting: 42.0   Smokeless tobacco: Never  Vaping Use   Vaping status: Never Used  Substance and Sexual Activity   Alcohol use: Yes    Alcohol/week: 3.0 standard drinks of alcohol    Types: 2 Cans of beer, 1 Standard drinks or equivalent per week   Drug use: No   Sexual activity: Not on file  Other Topics Concern   Not on file  Social History Narrative   Lives with wife    Right Handed   Drinks 2-3 cups caffeine daily   Social Drivers of Health   Tobacco Use: Medium Risk (06/19/2024)   Patient History    Smoking Tobacco Use:  Former    Smokeless Tobacco Use: Never    Passive Exposure: Not on Actuary Strain: Not on file  Food Insecurity: Not on file  Transportation Needs: Not on file  Physical Activity: Not on file  Stress: Not on file  Social Connections: Not on file  Intimate Partner Violence: Not on file  Depression (EYV7-0): Not on file  Alcohol Screen: Not on file  Housing: Not on file  Utilities: Not on file  Health Literacy: Not on file    Medications:   Current Outpatient Medications on File Prior to Visit  Medication Sig Dispense Refill   ANDROGEL  PUMP 20.25 MG/ACT (1.62%) GEL Apply 1 application topically daily.      buPROPion  (WELLBUTRIN  XL) 150 MG 24 hr tablet Take by mouth.     celecoxib  (CELEBREX ) 200 MG capsule Take 200 mg by mouth every other day.  (Patient taking differently: Take 200 mg by mouth daily.)     cholecalciferol (VITAMIN D3) 25 MCG (1000 UNIT) tablet Take 1,000 Units by mouth daily.     citalopram  (CELEXA ) 20 MG tablet Take 1 tablet by mouth daily.     Donanemab-azbt (KISUNLA IV) Inject 1 Dose into the vein See admin instructions. Infusion 1: 350mg  IV every 4 wk x1dose, infusion 2: 700mg  IV every 4 wk x1dose, infusion 3: 1050mg  IV every 4 wk x1dose, infusion 4: 1400mg  IV every 4 weeks. Maintenance dose: 1400mg  IV every 4 wk thereafter     donepezil (ARICEPT) 5 MG tablet Take 5 mg by mouth daily.     finasteride  (PROSCAR ) 5 MG tablet Take 5 mg by mouth every evening.      memantine  (NAMENDA ) 10 MG tablet TAKE ONE TABLET BY MOUTH TWICE DAILY. 180 tablet 0   Multiple Vitamin (MULTIVITAMIN WITH MINERALS) TABS tablet Take 1 tablet by mouth daily.     Multiple Vitamin (MULTIVITAMIN) tablet Take 1 tablet by mouth daily.     pantoprazole  (PROTONIX ) 40 MG tablet Take 40 mg by mouth 2 (two) times daily.     promethazine -dextromethorphan (PROMETHAZINE -DM) 6.25-15 MG/5ML syrup Take 5 mLs by mouth 4 (four) times daily as needed for cough.     sildenafil (VIAGRA) 50 MG  tablet Take 50 mg by mouth as needed.     tadalafil (CIALIS) 5 MG tablet Take by mouth as needed.     tamsulosin  (FLOMAX ) 0.4 MG CAPS capsule Take 0.4 mg by mouth daily.     traMADol  (ULTRAM ) 50 MG tablet Take 1 tablet (50 mg total) by mouth every 6 (six) hours as needed. 15 tablet 0   UNABLE TO FIND 10 mg. Med Name: alertec     No current facility-administered medications on file prior to visit.    Allergies:   Allergies  Allergen Reactions   Codeine Other (See Comments)    incoherance   Doxycycline Other (See Comments)    Stomach upset    Physical  Exam Today's Vitals   06/19/24 1313  BP: (!) 100/58  Pulse: 77  Weight: 166 lb 3.2 oz (75.4 kg)  Height: 5' 9 (1.753 m)   Body mass index is 24.54 kg/m.  General: well developed, well nourished elderly Caucasian male, seated, in no evident distress  Neurologic Exam Mental Status: Awake and fully alert. Recent memory mildly impaired and remote memory intact. Attention span, concentration and fund of knowledge appropriate. Mood and affect appropriate.  Cranial Nerves: right eyelid ptosis. Able to full close eyes. Pupils equal, briskly reactive to light. Extraocular movements full without nystagmus. Visual fields full to confrontation. Hearing intact. Facial sensation intact. Face, tongue, palate moves normally and symmetrically.  Motor: Normal bulk and tone. Normal strength in all tested extremity muscles. Sensory.: intact to touch , pinprick , position and vibratory sensation.  Coordination: Rapid alternating movements normal in all extremities. Finger-to-nose and heel-to-shin performed accurately bilaterally. Gait and Station: Arises from chair without difficulty. Stance is normal. Gait demonstrates normal stride length and balance . Able to heel, toe and tandem walk without difficulty.  Reflexes: 1+ and symmetric. Toes downgoing.        06/19/2024    1:15 PM 03/28/2024    7:55 AM 12/21/2023    1:28 PM 08/31/2023    2:23 PM  09/12/2022    9:03 AM 04/16/2020    8:59 AM  MMSE - Mini Mental State Exam  Orientation to time 2 4 4 4 5 5   Orientation to Place 5 5 5 4 5 5   Registration 3 3 3 3 3 3   Attention/ Calculation 1 5 3 3 5 5   Recall 1 2 1 2 2 3   Language- name 2 objects 2 2 2 2 2 2   Language- repeat 1 1 1 1 1 1   Language- follow 3 step command 3 3 3 3 3 3   Language- read & follow direction 1 1 1 1 1 1   Write a sentence 1 1 1 1 1 1   Copy design 1 1 0 1 0 1  Total score 21 28 24 25 28 30        ASSESSMENT/PLAN: 84 year old Caucasian male with transient episode of acute confusional state and psychosis following drinking excess cough syrup of unclear etiology in October 2021 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   Mild cognitive impairment due to Alzheimer's dementia MMSE today  21/30 (prior 28/30 although prior to that 24/30) - subjectively memory has been stable FAQ 12 (prior 18) showing improvement of functional ability  Kisunla initiated 04/2024, next infusion scheduled 07/08/2024 (3rd infusion) Scheduled for repeat MR brain 07/02/2024 MR brain 06/04/2024 without evidence of Aria  Right eyelid proptosis Dr. Rosemarie evaluated patient - believes more of a true eyelid weakness vs OMG as symptoms asymmetric and symptoms are not related to fatigue Schedule evaluation with neuro-ophthalmologist Dr. Gladis to ensure no neurological causes Plans on evaluation by surgery to discuss blepharoplasty      Follow-up in 6 months or call earlier if needed      I personally spent a total of 45 minutes in the care of the patient today including preparing to see the patient, getting/reviewing separately obtained history, performing a medically appropriate exam/evaluation, counseling and educating, referring and communicating with  other health care professionals, and documenting clinical information in the EHR.  Harlene Bogaert,  AGNP-BC  [facility] [address] [place], [place] [address]  Phone [phone] Fax [phone] Note: This document was prepared with digital dictation and possible smart phrase technology. Any transcriptional errors that result from this process are unintentional.  "

## 2024-06-19 ENCOUNTER — Encounter: Payer: Self-pay | Admitting: Adult Health

## 2024-06-19 ENCOUNTER — Ambulatory Visit: Admitting: Adult Health

## 2024-06-19 VITALS — BP 100/58 | HR 77 | Ht 69.0 in | Wt 166.2 lb

## 2024-06-19 DIAGNOSIS — H02401 Unspecified ptosis of right eyelid: Secondary | ICD-10-CM

## 2024-06-19 DIAGNOSIS — G309 Alzheimer's disease, unspecified: Secondary | ICD-10-CM

## 2024-06-19 DIAGNOSIS — G3184 Mild cognitive impairment, so stated: Secondary | ICD-10-CM | POA: Diagnosis not present

## 2024-06-28 NOTE — CV Procedure (Signed)
" °  Device system confirmed to be MRI conditional, with implant date > 6 weeks ago, and no evidence of abandoned or epicardial leads in review of most recent CXR  Device last cleared by EP Provider: Prentice Passey 06/28/2024 (cleared 05/2024)  Clearance is good through for 1 year as long as parameters remain stable at time of check. If pt undergoes a cardiac device procedure during that time, they should be re-cleared.   Tachy-therapies to be programmed off if applicable with device back to pre-MRI settings after completion of exam.  [employer] - Programming recommendation received through [employer] App/Tablet  Rocky Catalan, RT  06/28/2024 8:47 AM     "

## 2024-07-02 ENCOUNTER — Ambulatory Visit (HOSPITAL_COMMUNITY)
Admission: RE | Admit: 2024-07-02 | Discharge: 2024-07-02 | Disposition: A | Discharge status: Home / Self Care | Source: Ambulatory Visit | Attending: Neurology | Admitting: Neurology

## 2024-07-02 ENCOUNTER — Ambulatory Visit

## 2024-07-02 DIAGNOSIS — G301 Alzheimer's disease with late onset: Secondary | ICD-10-CM | POA: Diagnosis present

## 2024-07-02 DIAGNOSIS — R9089 Other abnormal findings on diagnostic imaging of central nervous system: Secondary | ICD-10-CM | POA: Insufficient documentation

## 2024-07-02 DIAGNOSIS — I495 Sick sinus syndrome: Secondary | ICD-10-CM

## 2024-07-02 DIAGNOSIS — F0280 Dementia in other diseases classified elsewhere without behavioral disturbance: Secondary | ICD-10-CM | POA: Diagnosis present

## 2024-07-02 NOTE — Progress Notes (Signed)
 This RN took over monitoring patient from [place], [facility].    Post-procedure Heart Rate (Once pacemaker is returned to baseline mode): 73 BPM. According to previous RN, heart rate was 74 prior to scan.  Pt up and walking without complications

## 2024-07-03 ENCOUNTER — Telehealth: Payer: Self-pay | Admitting: *Deleted

## 2024-07-03 NOTE — Telephone Encounter (Signed)
 Mliss w/ [facility] states pt's next Kisunla infusion is tomorrow. Need MRI brain from 07/02/24 read asap and obtain approval from doctor to move forward with infusion.

## 2024-07-04 ENCOUNTER — Telehealth: Payer: Self-pay | Admitting: Neurology

## 2024-07-04 ENCOUNTER — Ambulatory Visit: Payer: Self-pay | Admitting: Neurology

## 2024-07-04 LAB — CUP PACEART REMOTE DEVICE CHECK
Battery Remaining Longevity: 104 mo
Battery Voltage: 2.99 V
Brady Statistic AP VP Percent: 0.02 %
Brady Statistic AP VS Percent: 0.27 %
Brady Statistic AS VP Percent: 0.05 %
Brady Statistic AS VS Percent: 99.66 %
Brady Statistic RA Percent Paced: 0.29 %
Brady Statistic RV Percent Paced: 0.07 %
Date Time Interrogation Session: 20260120005827
Implantable Lead Connection Status: 753985
Implantable Lead Connection Status: 753985
Implantable Lead Implant Date: 20180126
Implantable Lead Implant Date: 20180126
Implantable Lead Location: 753859
Implantable Lead Location: 753860
Implantable Lead Model: 5076
Implantable Lead Model: 5076
Implantable Pulse Generator Implant Date: 20180126
Lead Channel Impedance Value: 285 Ohm
Lead Channel Impedance Value: 304 Ohm
Lead Channel Impedance Value: 361 Ohm
Lead Channel Impedance Value: 399 Ohm
Lead Channel Pacing Threshold Amplitude: 0.75 V
Lead Channel Pacing Threshold Amplitude: 0.875 V
Lead Channel Pacing Threshold Pulse Width: 0.4 ms
Lead Channel Pacing Threshold Pulse Width: 0.4 ms
Lead Channel Sensing Intrinsic Amplitude: 1.875 mV
Lead Channel Sensing Intrinsic Amplitude: 1.875 mV
Lead Channel Sensing Intrinsic Amplitude: 3.5 mV
Lead Channel Sensing Intrinsic Amplitude: 3.5 mV
Lead Channel Setting Pacing Amplitude: 2 V
Lead Channel Setting Pacing Amplitude: 2.5 V
Lead Channel Setting Pacing Pulse Width: 0.4 ms
Lead Channel Setting Sensing Sensitivity: 2 mV
Zone Setting Status: 755011
Zone Setting Status: 755011

## 2024-07-04 NOTE — Telephone Encounter (Signed)
 I reviewed the patient's MRI scan done on 07/02/2024 which shows 2 new areas of FLAIR edema in the right parietal and frontal lobes which is finding not common after treatment with kisunla but need to temporarily suspend the treatment and repeat MRI in 1 month.  If this changes during shrink or remain stable may consider resuming treatment later.  These findings are not mentioned in the initial MRI report by the radiologist but I will communicate with them to do an addendum.  I relayed this information to the patient's wife Donia who expressed understanding.

## 2024-07-04 NOTE — Telephone Encounter (Signed)
 Spoke with Dr Rosemarie who sees new ARIA-E on MRI. I called [facility] Radiology reading room per Dr Rosemarie and asked Luke to have radiologist amend report and review again. Per Dr Rosemarie, pause Kisunla infusions effective immediately. Will do monthly MRI to evaluate for readiness to resume. Dr Rosemarie to call patient.

## 2024-07-05 NOTE — Progress Notes (Signed)
 Remote PPM Transmission

## 2024-07-09 ENCOUNTER — Ambulatory Visit: Payer: Self-pay | Admitting: Cardiovascular Disease

## 2024-07-10 ENCOUNTER — Telehealth: Payer: Self-pay | Admitting: Adult Health

## 2024-07-10 NOTE — Telephone Encounter (Signed)
 Wife(on DPR) is asking for a call to discuss Headaches pt has had  for the last couple of days.

## 2024-07-10 NOTE — Telephone Encounter (Signed)
Can you triage

## 2024-07-10 NOTE — Telephone Encounter (Signed)
 I returned patient's wife's call.   He does have a new headache that started about 1 week ago.  It does not occur every day.  Had one yesterday and today. Tylenol  resolves the pain.  The location is the crown of his head at the top/back.  His wife denies any other neurological symptoms observed.  She did state that about 1 month ago he started having an altered sleep pattern.  She states he has always been able to sleep for a long time, for example going to sleep at 9 pm and waking up at noon the next day. Recently he has been up and down all night long which is unusual for him. Otherwise she states the patient's memory loss seems stable. She just felt we may want to know about his headache. Will make Dr Rosemarie aware of this and see what his recommendation is.   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 He is supposed to get another MRI in 1 month.

## 2024-07-11 ENCOUNTER — Other Ambulatory Visit: Payer: Self-pay | Admitting: Neurology

## 2024-07-11 DIAGNOSIS — G309 Alzheimer's disease, unspecified: Secondary | ICD-10-CM

## 2024-07-11 NOTE — Telephone Encounter (Signed)
 Pt wife called returning call ,Informed Pt wife that  Nurse will call back

## 2024-07-11 NOTE — Telephone Encounter (Signed)
 Spoke with Dr Rosemarie and confirmed pt's wife can continue to monitor his headaches and if patient develops any neurological changes call doctor. For any stroke-like symptoms or sudden changes call 911 for EMS transport to ED.   I called pt's wife and LVM asking for call.

## 2024-07-11 NOTE — Progress Notes (Signed)
 Metta, this MRI needs to be 1 month from the last one in January.

## 2024-07-11 NOTE — Telephone Encounter (Addendum)
 I called the wife back and let her know I talked to Dr Rosemarie. Continue to monitor headaches. Notify us  for any changes. If any sudden onset of neurological symptoms for example, stroke symptoms (discussed multiple examples), call 911 for emergency care. She verbalized understanding and appreciation for the call. She said pt does not have a headache today. She knows MRI has been ordered.

## 2024-08-13 ENCOUNTER — Other Ambulatory Visit (HOSPITAL_COMMUNITY)

## 2024-10-01 ENCOUNTER — Encounter

## 2024-12-02 ENCOUNTER — Ambulatory Visit: Admitting: Neurology

## 2024-12-31 ENCOUNTER — Encounter

## 2025-01-13 ENCOUNTER — Ambulatory Visit: Admitting: Adult Health
